# Patient Record
Sex: Female | Born: 1940 | Race: White | Hispanic: No | Marital: Single | Smoking: Never smoker
Health system: Southern US, Community
[De-identification: ages and names within clinical notes are randomized; demographics above are authoritative.]

## PROBLEM LIST (undated history)

## (undated) ENCOUNTER — Emergency Department (HOSPITAL_COMMUNITY): Payer: Medicare Other

## (undated) DIAGNOSIS — M810 Age-related osteoporosis without current pathological fracture: Secondary | ICD-10-CM

## (undated) DIAGNOSIS — R011 Cardiac murmur, unspecified: Secondary | ICD-10-CM

## (undated) DIAGNOSIS — H269 Unspecified cataract: Secondary | ICD-10-CM

## (undated) DIAGNOSIS — D759 Disease of blood and blood-forming organs, unspecified: Secondary | ICD-10-CM

## (undated) DIAGNOSIS — R509 Fever, unspecified: Secondary | ICD-10-CM

## (undated) HISTORY — DX: Unspecified cataract: H26.9

## (undated) HISTORY — PX: EYE SURGERY: SHX253

---

## 1998-04-16 ENCOUNTER — Ambulatory Visit (HOSPITAL_COMMUNITY): Admission: RE | Admit: 1998-04-16 | Discharge: 1998-04-16 | Payer: Self-pay | Admitting: Obstetrics & Gynecology

## 2000-06-17 ENCOUNTER — Other Ambulatory Visit: Admission: RE | Admit: 2000-06-17 | Discharge: 2000-06-17 | Payer: Self-pay | Admitting: Internal Medicine

## 2004-05-07 ENCOUNTER — Ambulatory Visit: Payer: Self-pay | Admitting: Internal Medicine

## 2004-05-14 ENCOUNTER — Ambulatory Visit: Payer: Self-pay | Admitting: Internal Medicine

## 2006-07-20 ENCOUNTER — Ambulatory Visit (HOSPITAL_COMMUNITY): Admission: RE | Admit: 2006-07-20 | Discharge: 2006-07-20 | Payer: Self-pay | Admitting: Internal Medicine

## 2007-08-16 ENCOUNTER — Ambulatory Visit (HOSPITAL_COMMUNITY): Admission: RE | Admit: 2007-08-16 | Discharge: 2007-08-16 | Payer: Self-pay | Admitting: Internal Medicine

## 2008-08-30 ENCOUNTER — Ambulatory Visit (HOSPITAL_COMMUNITY): Admission: RE | Admit: 2008-08-30 | Discharge: 2008-08-30 | Payer: Self-pay | Admitting: Internal Medicine

## 2009-09-05 ENCOUNTER — Ambulatory Visit (HOSPITAL_COMMUNITY): Admission: RE | Admit: 2009-09-05 | Discharge: 2009-09-05 | Payer: Self-pay | Admitting: Internal Medicine

## 2009-12-11 ENCOUNTER — Telehealth: Payer: Self-pay | Admitting: Internal Medicine

## 2010-01-05 ENCOUNTER — Ambulatory Visit: Payer: Self-pay | Admitting: Internal Medicine

## 2010-01-05 DIAGNOSIS — R159 Full incontinence of feces: Secondary | ICD-10-CM | POA: Insufficient documentation

## 2010-01-05 DIAGNOSIS — K648 Other hemorrhoids: Secondary | ICD-10-CM | POA: Insufficient documentation

## 2010-04-09 NOTE — Assessment & Plan Note (Signed)
Summary: Fecal incontinence   History of Present Illness Visit Type: consult Primary GI MD: Yancey Flemings MD Primary Provider: Geoffry Paradise, MD Chief Complaint: Staining in underpants History of Present Illness:   70 year old female with a history of hyperlipidemia and osteoarthritis. She presents today regarding fecal incontinence. Patient reports a several year history of intermittent problems with staining of her underwear with small-volume fecal material. This can occur several times per month. She notices this with certain food products such as milkshakes or nuts. Also with heavy lifting. She thinks that she has noticed protruding hemorrhoids. No abdominal pain, change in bowel habits, or bleeding. Her weight has been stable. No prior GYN surgery. No history of childbirth. She did undergo complete colonoscopy in March of 2006. This was normal except for sigmoid diverticulosis. Review of outside records from Dr. Lanell Matar office reveals recent CBC and comprehensive metabolic panel to be normal. As well, normal urinalysis and Hemoccult negative stool.   GI Review of Systems      Denies abdominal pain, acid reflux, belching, bloating, chest pain, dysphagia with liquids, dysphagia with solids, heartburn, loss of appetite, nausea, vomiting, vomiting blood, weight loss, and  weight gain.      Reports fecal incontinence.     Denies anal fissure, black tarry stools, change in bowel habit, constipation, diarrhea, diverticulosis, heme positive stool, hemorrhoids, irritable bowel syndrome, jaundice, light color stool, liver problems, rectal bleeding, and  rectal pain.    Current Medications (verified): 1)  Calcium 1200 1200-1000 Mg-Unit Chew (Calcium Carbonate-Vit D-Min) .Marland Kitchen.. 1 By Mouth Two Times A Day 2)  Fish Oil 1000 Mg Caps (Omega-3 Fatty Acids) .Marland Kitchen.. 1 By Mouth Two Times A Day 3)  Ocuvite Adult Formula  Caps (Multiple Vitamins-Minerals) .Marland Kitchen.. 1 By Mouth Once Daily 4)  Reclast 5 Mg/155ml Soln  (Zoledronic Acid) .... Intravenously Yearly 5)  Pravastatin Sodium 40 Mg Tabs (Pravastatin Sodium) .Marland Kitchen.. 1 By Mouth Once Daily  Allergies (verified): No Known Drug Allergies  Past History:  Past Medical History: Reviewed history from 01/02/2010 and no changes required. Diverticulosis Osteopenia Hyperlipidemia Osteoarthritis  Past Surgical History: Reviewed history from 01/02/2010 and no changes required. Laser retinopexy  Family History: None  Social History: Alcohol Use - yes Patient has never smoked.  Single, no children Lives with housemate Retired Illicit Drug Use - no  Review of Systems  The patient denies allergy/sinus, anemia, anxiety-new, arthritis/joint pain, back pain, blood in urine, breast changes/lumps, confusion, cough, coughing up blood, depression-new, fainting, fatigue, fever, headaches-new, hearing problems, heart murmur, heart rhythm changes, itching, menstrual pain, muscle pains/cramps, night sweats, nosebleeds, pregnancy symptoms, shortness of breath, skin rash, sleeping problems, sore throat, swelling of feet/legs, swollen lymph glands, thirst - excessive, urination - excessive, urination changes/pain, urine leakage, vision changes, and voice change.    Vital Signs:  Patient profile:   70 year old female Height:      66 inches Weight:      143 pounds BMI:     23.16 Pulse rate:   60 / minute Pulse rhythm:   regular BP sitting:   142 / 90  (left arm) Cuff size:   regular  Vitals Entered By: Francee Piccolo CMA Duncan Dull) (January 05, 2010 9:19 AM)  Physical Exam  General:  Well developed, well nourished, no acute distress. Head:  Normocephalic and atraumatic. Eyes:  PERRLA, no icterus. Mouth:  No deformity or lesions. Neck:  Supple; no masses or thyromegaly. Lungs:  Clear throughout to auscultation. Heart:  Regular rate and rhythm; no  murmurs, rubs,  or bruits. Abdomen:  Soft, nontender and nondistended. No masses, hepatosplenomegaly or  hernias noted. Normal bowel sounds. Rectal:  no external abnormalities. Rectal tone was adequate. No internal mass or tenderness, palpable internal hemorrhoids. Hemoccult negative stool Msk:  Symmetrical with no gross deformities. Normal posture. Pulses:  Normal pulses noted. Extremities:  no edema Neurologic:  alert and oriented Skin:  Intact without significant lesions or rashes. Psych:  Alert and cooperative. Normal mood and affect.   Impression & Recommendations:  Problem # 1:  FECAL INCONTINENCE (ICD-787.6) mild fecal incontinence intermittently. Likely due to hemorrhoids.  Plans: #1 daily fiber supplementation with Metamucil one to 2 tablespoons #2. Anusol suppositories prescribed. One nightly for 2 weeks, then p.r.n. #3. Followup p.r.n.  Problem # 2:  HEMORRHOIDS, INTERNAL (ICD-455.0) see above  Problem # 3:  SCREENING COLORECTAL-CANCER (ICD-V76.51) screening colonoscopy up to date. Based on current guidelines, due for routine followup around March 2016.  Patient Instructions: 1)  Copy sent to : Geoffry Paradise, MD 2)  Anusol HC suppositories #30 x 1 RF 1 per rectum at bedtime as needed 3)  Metamucil 1 Tablespoon once daily 4)  Please schedule a follow-up appointment as needed.  5)  The medication list was reviewed and reconciled.  All changed / newly prescribed medications were explained.  A complete medication list was provided to the patient / caregiver. Prescriptions: ANUSOL-HC 25 MG SUPP (HYDROCORTISONE ACETATE) 1 per rectum at bedtime as needed  #30 x 1   Entered by:   Milford Cage NCMA   Authorized by:   Hilarie Fredrickson MD   Signed by:   Milford Cage NCMA on 01/05/2010   Method used:   Electronically to        [facility] [phone]* (retail)       [address]       [place], [place]  [id number]       Ph: [phone] or [phone]       Fax: [phone]   RxID:   [id number]

## 2010-04-09 NOTE — Procedures (Signed)
Summary: Colon   Colonoscopy  Procedure date:  05/14/2004  Findings:      Location:  [facility].   Patient Name: Katherine Atkins, Katherine Atkins MRN:  Procedure Procedures: Colonoscopy CPT: 04540.  Personnel: Endoscopist: Wilhemina Bonito. Marina Goodell, MD.  Referred By: Pearletha Furl Jacky Kindle, MD.  Exam Location: Exam performed in Outpatient Clinic. Outpatient  Patient Consent: Procedure, Alternatives, Risks and Benefits discussed, consent obtained, from patient. Consent was obtained by the RN.  Indications  Average Risk Screening Routine.  History  Current Medications: Patient is not currently taking Coumadin.  Pre-Exam Physical: Performed May 14, 2004. Entire physical exam was normal.  Exam Exam: Extent of exam reached: Cecum, extent intended: Cecum.  The cecum was identified by appendiceal orifice and IC valve. Patient position: on left side. Colon retroflexion performed. Images taken. ASA Classification: I. Tolerance: excellent.  Monitoring: Pulse and BP monitoring, Oximetry used. Supplemental O2 given.  Colon Prep Used MIRALAX for colon prep. Prep results: good.  Sedation Meds: Patient assessed and found to be appropriate for moderate (conscious) sedation. Fentanyl 50 mcg. given IV. Versed 7 mg. given IV.  Findings NORMAL EXAM: Cecum to Rectum.  - DIVERTICULOSIS: Sigmoid Colon. ICD9: Diverticulosis, Colon: 562.10.    Comments: NO POLYPS SEEN Assessment Abnormal examination, see findings above.  Diagnoses: 562.10: Diverticulosis, Colon.   Events  Unplanned Interventions: No intervention was required.  Unplanned Events: There were no complications. Plans Disposition: After procedure patient sent to recovery. After recovery patient sent home.  Scheduling/Referral: Colonoscopy, to Wilhemina Bonito. Marina Goodell, MD, IN ABOUT 7 YEARS IF MEDICALLY FIT,   Comments: RETURN TO THE CARE OF DR. Jacky Kindle This report was created from the original endoscopy report, which was reviewed and signed  by the above listed endoscopist.

## 2010-04-09 NOTE — Progress Notes (Signed)
Summary: Soiling Accidents  Phone Note From Other Clinic   Caller: [facility] @ Dr Jacky Kindle [phone] Call For: Dr Marina Goodell Reason for Call: Schedule Patient Appt Summary of Call: Having soiling accidents and would like to be seen asap. First available 01-23-10 Initial call taken by: Leanor Kail [facility],  December 11, 2009 10:34 AM  Follow-up for Phone Call        left message on machine to call back Chales Abrahams CMA Duncan Dull)  December 11, 2009 10:42 AM   pt having fecal incontinence and appt scheduled with  Dr Marina Goodell on 01/05/10. Follow-up by: Chales Abrahams CMA Duncan Dull),  December 11, 2009 1:53 PM

## 2010-09-08 ENCOUNTER — Encounter (HOSPITAL_COMMUNITY): Payer: Medicare Other | Attending: Internal Medicine

## 2010-09-08 DIAGNOSIS — M810 Age-related osteoporosis without current pathological fracture: Secondary | ICD-10-CM | POA: Insufficient documentation

## 2011-03-09 HISTORY — PX: CATARACT EXTRACTION: SUR2

## 2011-03-31 ENCOUNTER — Encounter: Payer: Self-pay | Admitting: Internal Medicine

## 2011-08-03 ENCOUNTER — Ambulatory Visit (INDEPENDENT_AMBULATORY_CARE_PROVIDER_SITE_OTHER): Payer: Medicare Other | Admitting: Family Medicine

## 2011-08-03 VITALS — BP 151/94 | HR 63 | Temp 98.5°F | Resp 20 | Ht 66.5 in | Wt 138.0 lb

## 2011-08-03 DIAGNOSIS — W458XXA Other foreign body or object entering through skin, initial encounter: Secondary | ICD-10-CM

## 2011-08-03 DIAGNOSIS — L02519 Cutaneous abscess of unspecified hand: Secondary | ICD-10-CM

## 2011-08-03 DIAGNOSIS — L03019 Cellulitis of unspecified finger: Secondary | ICD-10-CM

## 2011-08-03 DIAGNOSIS — T148XXA Other injury of unspecified body region, initial encounter: Secondary | ICD-10-CM

## 2011-08-03 MED ORDER — CEPHALEXIN 500 MG PO CAPS: 500.0000 mg | ORAL_CAPSULE | Freq: Three times a day (TID) | ORAL | Status: AC

## 2011-08-03 NOTE — Progress Notes (Signed)
  Patient Name: Katherine Atkins Date of Birth: [birth year] Medical Record Number: [id number] Gender: female Date of Encounter: 08/03/2011  History of Present Illness:  Katherine Atkins is a 71 y.o. very pleasant female patient who presents with the following:  About 9 days ago a piece of wood from a door went into the pad of her right ring finger.  She got some of the splinter out, but thinks there is still sore splinter in the finger.  The finger is also starting to be more tender- then yesterday she squeezed it and some pus came out.    Last tetanus shot about 2 years ago.   She is otherwise quite healthy and feeling well  Patient Active Problem List  Diagnoses  . HEMORRHOIDS, INTERNAL  . FECAL INCONTINENCE   No past medical history on file. No past surgical history on file. History  Substance Use Topics  . Smoking status: Never Smoker   . Smokeless tobacco: Not on file  . Alcohol Use: Not on file   No family history on file. No Known Allergies  Medication list has been reviewed and updated.  Review of Systems: As per HPI- otherwise negative.  Physical Examination: Filed Vitals:   08/03/11 1047  BP: 151/94  Pulse: 63  Temp: 98.5 F (36.9 C)  TempSrc: Oral  Resp: 20  Height: 5' 6.5" (1.689 m)  Weight: 138 lb (62.596 kg)    Body mass index is 21.94 kg/(m^2).    GEN: WDWN, NAD, Non-toxic, Alert & Oriented x 3 HEENT: Atraumatic, Normocephalic.  Ears and Nose: No external deformity. EXTR: No clubbing/cyanosis/edema NEURO: Normal gait.  PSYCH: Normally interactive. Conversant. Not depressed or anxious appearing.  Calm demeanor.   Right ring finger- there is a wound on the pad on the ring finger.  Cleaned the area and gently explored with a needle- no obvious splinter seen.  The area is a little bit swollen and tender  Assessment and Plan: 1. Cellulitis, finger  cephALEXin (KEFLEX) 500 MG capsule  2. Splinter     Splinter in finger- likely has a bit of cellulitis.  Do  not think it will be helpful to "dig" in finger to try and locate splinter, but will treat with keflex.  She will soak finger to try and bring any splinter that is still present to the surface.  Let me know if not better in the next few days- Sooner if worse.

## 2011-09-15 ENCOUNTER — Encounter (HOSPITAL_COMMUNITY): Payer: Self-pay

## 2011-09-15 ENCOUNTER — Encounter (HOSPITAL_COMMUNITY)
Admission: RE | Admit: 2011-09-15 | Discharge: 2011-09-15 | Disposition: A | Discharge status: Home / Self Care | Payer: Medicare Other | Source: Ambulatory Visit | Attending: Internal Medicine | Admitting: Internal Medicine

## 2011-09-15 DIAGNOSIS — M810 Age-related osteoporosis without current pathological fracture: Secondary | ICD-10-CM | POA: Insufficient documentation

## 2011-09-15 HISTORY — DX: Cardiac murmur, unspecified: R01.1

## 2011-09-15 HISTORY — DX: Age-related osteoporosis without current pathological fracture: M81.0

## 2011-09-15 MED ORDER — SODIUM CHLORIDE 0.9 % IV SOLN
Freq: Once | INTRAVENOUS | Status: AC
  Administered 2011-09-15: 16:00:00 via INTRAVENOUS

## 2011-09-15 MED ORDER — ZOLEDRONIC ACID 5 MG/100ML IV SOLN
5.0000 mg | Freq: Once | INTRAVENOUS | Status: AC
  Administered 2011-09-15: 5 mg via INTRAVENOUS
  Filled 2011-09-15: qty 100

## 2012-01-06 ENCOUNTER — Encounter: Payer: Self-pay | Admitting: Internal Medicine

## 2012-06-23 ENCOUNTER — Ambulatory Visit (INDEPENDENT_AMBULATORY_CARE_PROVIDER_SITE_OTHER): Payer: Medicare Other | Admitting: Family Medicine

## 2012-06-23 ENCOUNTER — Ambulatory Visit: Payer: Medicare Other

## 2012-06-23 VITALS — BP 178/96 | HR 66 | Temp 98.4°F | Resp 16 | Ht 65.5 in | Wt 146.2 lb

## 2012-06-23 DIAGNOSIS — M25531 Pain in right wrist: Secondary | ICD-10-CM

## 2012-06-23 DIAGNOSIS — S66911A Strain of unspecified muscle, fascia and tendon at wrist and hand level, right hand, initial encounter: Secondary | ICD-10-CM

## 2012-06-23 DIAGNOSIS — S63509A Unspecified sprain of unspecified wrist, initial encounter: Secondary | ICD-10-CM

## 2012-06-23 DIAGNOSIS — R0781 Pleurodynia: Secondary | ICD-10-CM

## 2012-06-23 DIAGNOSIS — M25539 Pain in unspecified wrist: Secondary | ICD-10-CM

## 2012-06-23 DIAGNOSIS — S20219A Contusion of unspecified front wall of thorax, initial encounter: Secondary | ICD-10-CM

## 2012-06-23 DIAGNOSIS — S20211A Contusion of right front wall of thorax, initial encounter: Secondary | ICD-10-CM

## 2012-06-23 DIAGNOSIS — R079 Chest pain, unspecified: Secondary | ICD-10-CM

## 2012-06-23 NOTE — Progress Notes (Signed)
[address]   [place], [place]  [address]   [phone]  Subjective:    Patient ID: Katherine Atkins, female    DOB: 1940/05/14, 72 y.o.   MRN: [id number]  HPI This 72 y.o. female presents for evaluation of R wrist pain and R rib pain. Tripped over branch in yard and landed on ground forward.  Knocked wind out of pt.  Strapped rib last night with relief.  Has been wearing wrist splint.    1. R wrist pain: unable to supinate well; range of motion limited.  Wearing wrist splint.  Able to massage area proximally.  No n/t in forearm or hand.  No nighttime awakening; took two Advil last night.  No previous injury.  +swelling.  Iced followed by heat all night.  2.  R ribs anterior pain: fell on ribs; painful.  No SOB; no coughing.   No pain with deep inspiration.  Minimal pain with range of motion.  PCP:  Frankey Poot.   Review of Systems  Constitutional: Negative for fever, chills, diaphoresis and fatigue.  Respiratory: Negative for cough, chest tightness, shortness of breath, wheezing and stridor.   Cardiovascular: Negative for chest pain.  Musculoskeletal: Positive for myalgias, joint swelling and arthralgias. Negative for back pain and gait problem.  Skin: Negative for color change and wound.  Neurological: Positive for weakness. Negative for numbness.        Past Medical History  Diagnosis Date  . Osteoporosis   . Heart murmur   . Cataract     Past Surgical History  Procedure Laterality Date  . Eye surgery      Prior to Admission medications   Medication Sig Start Date End Date Taking? Authorizing Provider  beta carotene w/minerals (OCUVITE) tablet Take 1 tablet by mouth daily.   Yes Historical Provider, MD  calcium & magnesium carbonates (MYLANTA) 311-232 MG per tablet Take 11,500 tablets by mouth daily.   Yes Historical Provider, MD  fish oil-omega-3 fatty acids 1000 MG capsule Take 600 mg by mouth daily.   Yes Historical Provider, MD  Red Yeast Rice Extract (RED YEAST  RICE PO) Take 1 capsule by mouth daily.   Yes Historical Provider, MD  Zoledronic Acid (RECLAST IV) Inject into the vein.   Yes Historical Provider, MD    No Known Allergies  History   Social History  . Marital Status: Single    Spouse Name: N/A    Number of Children: N/A  . Years of Education: N/A   Occupational History  . Not on file.   Social History Main Topics  . Smoking status: Never Smoker   . Smokeless tobacco: Not on file  . Alcohol Use: No  . Drug Use: No  . Sexually Active: Yes   Other Topics Concern  . Not on file   Social History Narrative  . No narrative on file    Family History  Problem Relation Age of Onset  . Kidney disease Father   . Heart disease Maternal Grandfather   . Heart disease Paternal Grandmother     Objective:   Physical Exam  Nursing note and vitals reviewed. Constitutional: She appears well-developed and well-nourished. No distress.  HENT:  Head: Normocephalic and atraumatic.  Cardiovascular: Normal rate, regular rhythm, normal heart sounds and intact distal pulses.   Pulmonary/Chest: Effort normal and breath sounds normal. She has no wheezes. She has no rales.  Musculoskeletal:       Right elbow: Normal.  Right wrist: She exhibits decreased range of motion, tenderness, bony tenderness and swelling. She exhibits no effusion, no crepitus, no deformity and no laceration.       Right hand: Normal.  R WRIST/HAND:  +TTP ULNAR ASPECT OF WRIST; DECREASED ROM OF PRONATION/SUPINATION; GRIP 5/5; NORMAL FLEXION; MILD PAIN WITH EXTENSION; HAND NON-TENDER WITHOUT SWELLING.   R ELBOW: NEAR FULL EXTENSION; NON-TENDER. R RIBS:  +TTP ANTERIOR RIBS UNDER BREAST.    Skin: Skin is warm and dry. No rash noted. She is not diaphoretic. No erythema. No pallor.  Psychiatric: She has a normal mood and affect. Her behavior is normal. Judgment and thought content normal.       [facility] reading (PRIMARY) by  Dr. Katrinka Blazing.  R WRIST: NAD   R RIBS:   NAD.   Assessment & Plan:  Right wrist pain - Plan: DG Wrist Complete Right  Rib pain on right side - Plan: DG Ribs Unilateral W/Chest Right  Strain of wrist, right, initial encounter  Rib contusion, right, initial encounter   1.  R wrist pain/strain:  New.  Wrist splint fitted in office. Recommend rest, ice, elevate, wrist splint.  Pt declined rx for NSAIDs. 2. R rib pain/contusion:  New.  Recommend rest, ice.  Recommend taking deep breaths frequently throughout the day.

## 2012-09-22 ENCOUNTER — Ambulatory Visit (HOSPITAL_COMMUNITY)
Admission: RE | Admit: 2012-09-22 | Discharge: 2012-09-22 | Disposition: A | Discharge status: Home / Self Care | Payer: Medicare Other | Source: Ambulatory Visit | Attending: Internal Medicine | Admitting: Internal Medicine

## 2012-09-22 ENCOUNTER — Other Ambulatory Visit (HOSPITAL_COMMUNITY): Payer: Self-pay | Admitting: Internal Medicine

## 2012-09-22 ENCOUNTER — Encounter (HOSPITAL_COMMUNITY): Payer: Self-pay

## 2012-09-22 DIAGNOSIS — M810 Age-related osteoporosis without current pathological fracture: Secondary | ICD-10-CM | POA: Insufficient documentation

## 2012-09-22 MED ORDER — ZOLEDRONIC ACID 5 MG/100ML IV SOLN
5.0000 mg | Freq: Once | INTRAVENOUS | Status: AC
  Administered 2012-09-22: 5 mg via INTRAVENOUS
  Filled 2012-09-22: qty 100

## 2012-09-22 MED ORDER — SODIUM CHLORIDE 0.9 % IV SOLN
Freq: Once | INTRAVENOUS | Status: AC
Start: 2012-09-22 — End: 2012-09-22
  Administered 2012-09-22: 09:00:00 via INTRAVENOUS

## 2013-08-25 ENCOUNTER — Ambulatory Visit (INDEPENDENT_AMBULATORY_CARE_PROVIDER_SITE_OTHER): Payer: Medicare Other

## 2013-08-25 ENCOUNTER — Encounter (HOSPITAL_COMMUNITY): Payer: Self-pay | Admitting: *Deleted

## 2013-08-25 ENCOUNTER — Inpatient Hospital Stay (HOSPITAL_COMMUNITY)
Admission: AD | Admit: 2013-08-25 | Discharge: 2013-08-28 | DRG: 864 | Disposition: A | Discharge status: Home / Self Care | Payer: Medicare Other | Source: Ambulatory Visit | Attending: Internal Medicine | Admitting: Internal Medicine

## 2013-08-25 ENCOUNTER — Ambulatory Visit (INDEPENDENT_AMBULATORY_CARE_PROVIDER_SITE_OTHER): Payer: Medicare Other | Admitting: Emergency Medicine

## 2013-08-25 VITALS — BP 116/70 | HR 74 | Temp 97.8°F | Resp 14 | Ht 66.0 in | Wt 145.6 lb

## 2013-08-25 DIAGNOSIS — R509 Fever, unspecified: Secondary | ICD-10-CM

## 2013-08-25 DIAGNOSIS — R7881 Bacteremia: Secondary | ICD-10-CM | POA: Diagnosis present

## 2013-08-25 DIAGNOSIS — D696 Thrombocytopenia, unspecified: Secondary | ICD-10-CM

## 2013-08-25 DIAGNOSIS — M810 Age-related osteoporosis without current pathological fracture: Secondary | ICD-10-CM | POA: Diagnosis present

## 2013-08-25 DIAGNOSIS — E871 Hypo-osmolality and hyponatremia: Secondary | ICD-10-CM | POA: Diagnosis present

## 2013-08-25 DIAGNOSIS — M1990 Unspecified osteoarthritis, unspecified site: Secondary | ICD-10-CM | POA: Diagnosis present

## 2013-08-25 DIAGNOSIS — D72819 Decreased white blood cell count, unspecified: Secondary | ICD-10-CM | POA: Insufficient documentation

## 2013-08-25 DIAGNOSIS — E785 Hyperlipidemia, unspecified: Secondary | ICD-10-CM | POA: Diagnosis present

## 2013-08-25 DIAGNOSIS — D61818 Other pancytopenia: Secondary | ICD-10-CM | POA: Diagnosis present

## 2013-08-25 DIAGNOSIS — R7989 Other specified abnormal findings of blood chemistry: Secondary | ICD-10-CM | POA: Diagnosis present

## 2013-08-25 DIAGNOSIS — IMO0001 Reserved for inherently not codable concepts without codable children: Secondary | ICD-10-CM | POA: Diagnosis present

## 2013-08-25 DIAGNOSIS — Z9849 Cataract extraction status, unspecified eye: Secondary | ICD-10-CM

## 2013-08-25 DIAGNOSIS — K5730 Diverticulosis of large intestine without perforation or abscess without bleeding: Secondary | ICD-10-CM | POA: Diagnosis present

## 2013-08-25 DIAGNOSIS — R21 Rash and other nonspecific skin eruption: Secondary | ICD-10-CM

## 2013-08-25 DIAGNOSIS — W57XXXA Bitten or stung by nonvenomous insect and other nonvenomous arthropods, initial encounter: Secondary | ICD-10-CM | POA: Diagnosis present

## 2013-08-25 DIAGNOSIS — T148 Other injury of unspecified body region: Secondary | ICD-10-CM | POA: Diagnosis present

## 2013-08-25 DIAGNOSIS — R945 Abnormal results of liver function studies: Secondary | ICD-10-CM

## 2013-08-25 DIAGNOSIS — B9689 Other specified bacterial agents as the cause of diseases classified elsewhere: Secondary | ICD-10-CM | POA: Diagnosis present

## 2013-08-25 HISTORY — DX: Fever, unspecified: R50.9

## 2013-08-25 HISTORY — DX: Disease of blood and blood-forming organs, unspecified: D75.9

## 2013-08-25 LAB — POCT URINALYSIS DIPSTICK
Glucose, UA: NEGATIVE
KETONES UA: 15
LEUKOCYTES UA: NEGATIVE
Nitrite, UA: NEGATIVE
PH UA: 5.5
PROTEIN UA: 100
SPEC GRAV UA: 1.025
Urobilinogen, UA: 1

## 2013-08-25 LAB — URINALYSIS, ROUTINE W REFLEX MICROSCOPIC
BILIRUBIN URINE: NEGATIVE
Glucose, UA: NEGATIVE mg/dL
Hgb urine dipstick: NEGATIVE
Ketones, ur: 15 mg/dL — AB
Leukocytes, UA: NEGATIVE
Nitrite: NEGATIVE
PH: 6 (ref 5.0–8.0)
Protein, ur: NEGATIVE mg/dL
Specific Gravity, Urine: 1.014 (ref 1.005–1.030)
Urobilinogen, UA: 1 mg/dL (ref 0.0–1.0)

## 2013-08-25 LAB — POCT UA - MICROSCOPIC ONLY
Amorphous: POSITIVE
BACTERIA, U MICROSCOPIC: NEGATIVE
Crystals, Ur, HPF, POC: NEGATIVE
EPITHELIAL CELLS, URINE PER MICROSCOPY: NEGATIVE
MUCUS UA: NEGATIVE
YEAST UA: NEGATIVE

## 2013-08-25 LAB — POCT CBC
Granulocyte percent: 62.6 %G (ref 37–80)
HEMATOCRIT: 44.8 % (ref 37.7–47.9)
HEMOGLOBIN: 14.4 g/dL (ref 12.2–16.2)
LYMPH, POC: 0.8 (ref 0.6–3.4)
MCH: 30.5 pg (ref 27–31.2)
MCHC: 32.1 g/dL (ref 31.8–35.4)
MCV: 95 fL (ref 80–97)
MID (cbc): 0.2 (ref 0–0.9)
MPV: 8.9 fL (ref 0–99.8)
POC Granulocyte: 1.7 — AB (ref 2–6.9)
POC LYMPH %: 31 % (ref 10–50)
POC MID %: 6.4 %M (ref 0–12)
Platelet Count, POC: 79 10*3/uL — AB (ref 142–424)
RBC: 4.72 M/uL (ref 4.04–5.48)
RDW, POC: 13.1 %
WBC: 2.7 10*3/uL — AB (ref 4.6–10.2)

## 2013-08-25 LAB — COMPREHENSIVE METABOLIC PANEL
ALBUMIN: 3.9 g/dL (ref 3.5–5.2)
ALBUMIN: 2.8 g/dL — AB (ref 3.5–5.2)
ALT: 152 U/L — ABNORMAL HIGH (ref 0–35)
ALT: 131 U/L — AB (ref 0–35)
AST: 176 U/L — AB (ref 0–37)
AST: 149 U/L — AB (ref 0–37)
Alkaline Phosphatase: 218 U/L — ABNORMAL HIGH (ref 39–117)
Alkaline Phosphatase: 191 U/L — ABNORMAL HIGH (ref 39–117)
BILIRUBIN TOTAL: 1.5 mg/dL — AB (ref 0.3–1.2)
BUN: 16 mg/dL (ref 6–23)
BUN: 14 mg/dL (ref 6–23)
CALCIUM: 8.7 mg/dL (ref 8.4–10.5)
CHLORIDE: 95 meq/L — AB (ref 96–112)
CHLORIDE: 94 meq/L — AB (ref 96–112)
CO2: 25 mEq/L (ref 19–32)
CO2: 20 mEq/L (ref 19–32)
CREATININE: 1.1 mg/dL (ref 0.50–1.10)
Calcium: 8.2 mg/dL — ABNORMAL LOW (ref 8.4–10.5)
Creatinine, Ser: 0.84 mg/dL (ref 0.50–1.10)
GFR calc Af Amer: 77 mL/min — ABNORMAL LOW (ref >90)
GFR calc non Af Amer: 67 mL/min — ABNORMAL LOW (ref >90)
GLUCOSE: 105 mg/dL — AB (ref 70–99)
Glucose, Bld: 157 mg/dL — ABNORMAL HIGH (ref 70–99)
POTASSIUM: 4.1 meq/L (ref 3.5–5.3)
POTASSIUM: 3.6 meq/L — AB (ref 3.7–5.3)
SODIUM: 130 meq/L — AB (ref 137–147)
Sodium: 130 mEq/L — ABNORMAL LOW (ref 135–145)
Total Bilirubin: 1.8 mg/dL — ABNORMAL HIGH (ref 0.2–1.2)
Total Protein: 6.9 g/dL (ref 6.0–8.3)
Total Protein: 6 g/dL (ref 6.0–8.3)

## 2013-08-25 LAB — CBC
HCT: 36.4 % (ref 36.0–46.0)
Hemoglobin: 12.4 g/dL (ref 12.0–15.0)
MCH: 30.8 pg (ref 26.0–34.0)
MCHC: 34.1 g/dL (ref 30.0–36.0)
MCV: 90.3 fL (ref 78.0–100.0)
PLATELETS: 60 10*3/uL — AB (ref 150–400)
RBC: 4.03 MIL/uL (ref 3.87–5.11)
RDW: 13 % (ref 11.5–15.5)
WBC: 2.1 10*3/uL — ABNORMAL LOW (ref 4.0–10.5)

## 2013-08-25 LAB — SEDIMENTATION RATE: Sed Rate: 5 mm/hr (ref 0–22)

## 2013-08-25 MED ORDER — ACETAMINOPHEN 325 MG PO TABS
650.0000 mg | ORAL_TABLET | Freq: Four times a day (QID) | ORAL | Status: DC | PRN
  Administered 2013-08-25 – 2013-08-28 (×3): 650 mg via ORAL
  Filled 2013-08-25 (×3): qty 2

## 2013-08-25 MED ORDER — CALCIUM & MAGNESIUM CARBONATES 311-232 MG PO TABS: 1.0000 | ORAL_TABLET | Freq: Two times a day (BID) | ORAL | Status: DC | PRN

## 2013-08-25 MED ORDER — OMEGA-3 FATTY ACIDS 1000 MG PO CAPS: 1.0000 g | ORAL_CAPSULE | Freq: Every day | ORAL | Status: DC

## 2013-08-25 MED ORDER — OCUVITE PO TABS
1.0000 | ORAL_TABLET | Freq: Every day | ORAL | Status: DC
  Administered 2013-08-26 – 2013-08-28 (×3): 1 via ORAL
  Filled 2013-08-25 (×3): qty 1

## 2013-08-25 MED ORDER — SODIUM CHLORIDE 0.9 % IV SOLN
INTRAVENOUS | Status: DC
Start: 2013-08-25 — End: 2013-08-28
  Administered 2013-08-25 – 2013-08-26 (×2): via INTRAVENOUS
  Administered 2013-08-28: 75 mL/h via INTRAVENOUS

## 2013-08-25 MED ORDER — OMEGA-3-ACID ETHYL ESTERS 1 G PO CAPS
1.0000 g | ORAL_CAPSULE | Freq: Every day | ORAL | Status: DC
  Administered 2013-08-26 – 2013-08-28 (×3): 1 g via ORAL
  Filled 2013-08-25 (×3): qty 1

## 2013-08-25 MED ORDER — ACETAMINOPHEN 650 MG RE SUPP: 650.0000 mg | Freq: Four times a day (QID) | RECTAL | Status: DC | PRN

## 2013-08-25 MED ORDER — CALCIUM CARBONATE ANTACID 500 MG PO CHEW: 1.0000 | CHEWABLE_TABLET | Freq: Two times a day (BID) | ORAL | Status: DC | PRN

## 2013-08-25 NOTE — H&P (Signed)
PCP:   No primary Damier Disano on file.   Chief Complaint:  General malaise, some myalgias associated with fevers up to 102.7 over the last 2-days  HPI: 73 year old female active on a written form with a past medical history significant for mild osteoarthritis, osteoporosis, allergic rhinitis and mild hyperlipidemia on no active prescription medications except for intravenous Reclast last administered last year. She does have some issues with chronic mild cough secondary to postnasal drip from allergies. Patient and noted a 2-3 day history of intermittent fevers up 102.93F with general malaise some arthralgias associated with high fever some cold intolerance as a result, but denies any visual changes headaches, difficulty swallowing, does have chronic dry cough, which is unchanged per patient report, denies any shortness of breath, wheezing, bronchospasm, chest pain, palpitations, orthopnea, presyncope or syncope, denies nausea, vomiting,  constipation, blood in stool however did have 2 episodes of diarrhea without blood last Thursday, denies any lower extremity edema, dysuria, frequency, gross hematuria. Denies any skin changes. However when evaluated in urgent care earlier today, skin rash was noted and she did observe that this was new for her. Denies any new medications and indeed is quite active around her property, has been painting a deck, fixing fence, and has been exposed to multiple takes, last exposed off approximately 2 weeks ago, denies any synovitis, has had a decrease in appetite over the last 24-48 hours. But denies any gastric irritation.  Review of Systems:  See history of present illness-The patient denies  weight loss,, vision loss, decreased hearing, hoarseness, chest pain, syncope, dyspnea on exertion, peripheral edema, balance deficits, hemoptysis, abdominal pain, melena, hematochezia, severe indigestion/heartburn, hematuria, incontinence, genital sores, muscle weakness,  transient  blindness, difficulty walking, depression, unusual weight change, abnormal bleeding, enlarged lymph nodes, angioedema, and breast masses.  Past Medical History: Past Medical History  Diagnosis Date  . Osteoporosis   . Heart murmur   . Cataract    Past Surgical History  Procedure Laterality Date  . Eye surgery    . Cataract extraction  2013  Past History  Past Medical History (reviewed - no changes required): Allergic rhinitis  Diverticulosis  Hyperlipidemia  Osteoarthritis  Osteopenia  Retinal break without detachment, OS (November 2009)  History left leg remote fracture skiing Surgical History (reviewed - no changes required): Laser retinopexy (January 31, 2008; Dr. Fawn KirkGary Rankin)  Family History significant for Alzheimer's dementia late age Social History (reviewed - no changes required): Patient is single. Has significant other She is retired. Patient denies the use of tobacco, illicit drugs, and excessive alcohol.    Physical Exam  Pleasant, verbal, female in no apparent distress answering all questions appropriately no respiratory distress Extraocular movements are intact sclera anicteric face is symmetric, no oropharyngeal lesions, dentition intact Neck supple, no cervical lymphadenopathy, or rigidity, no stiffness, carotids palpable Lungs clear to auscultation bilaterally without bronchospasm or rhonchi Cardiovascular reveals regular rate and rhythm without murmurs rubs or gallops appreciated Abdomen soft, nontender, nondistended, bowel sounds present no hepatosplenomegaly appreciated  No edema, pedal pulses intact, no cyanosis, Oster 3 changes mild in the distal extremities, no active synovitis, no erythema Cranial nerves II through XII grossly intact, no resting tremors, tone normal, strength intact in all 4 extremities, no sensory deficits to light touch Alert and oriented x3 Skin exam does reveal a very fine maculopapular rash that blanches slightly erythematous on the  abdomen and possibly to some extent on the thighs and distal calves but very minor and faint had to be  pointed out to the patient agreed that this was new  Medications: Prior to Admission medications   Medication Sig Start Date End Date Taking? Authorizing Shondra Capps  beta carotene w/minerals (OCUVITE) tablet Take 1 tablet by mouth daily.    Historical Analysa Nutting, MD  calcium & magnesium carbonates (MYLANTA) 311-232 MG per tablet Take 11,500 tablets by mouth daily.    Historical Evania Lyne, MD  fish oil-omega-3 fatty acids 1000 MG capsule Take 600 mg by mouth daily.    Historical Ram Haugan, MD  Red Yeast Rice Extract (RED YEAST RICE PO) Take 1 capsule by mouth daily.    Historical Airyanna Dipalma, MD  Zoledronic Acid (RECLAST IV) Inject into the vein.    Historical Serge Main, MD    Allergies:  No Known Allergies  Physical Exam: Filed Vitals:   08/25/13 1526  BP: 122/79  Pulse: 73  Temp: 96.1 F (35.6 C)  TempSrc: Oral  Resp: 15  Height: 5\' 6"  (1.676 m)  Weight: 65.772 kg (145 lb)  SpO2: 97%      Labs on Admission:  No results found for this basename: NA, K, CL, CO2, GLUCOSE, BUN, CREATININE, CALCIUM, MG, PHOS,  in the last 72 hours No results found for this basename: AST, ALT, ALKPHOS, BILITOT, PROT, ALBUMIN,  in the last 72 hours No results found for this basename: LIPASE, AMYLASE,  in the last 72 hours  Recent Labs  08/25/13 1343  WBC 2.7*  HGB 14.4  HCT 44.8  MCV 95.0   No results found for this basename: CKTOTAL, CKMB, CKMBINDEX, TROPONINI,  in the last 72 hours No results found for this basename: TSH, T4TOTAL, FREET3, T3FREE, THYROIDAB,  in the last 72 hours No results found for this basename: VITAMINB12, FOLATE, FERRITIN, TIBC, IRON, RETICCTPCT,  in the last 72 hours  Radiological Exams on Admission: Dg Chest 2 View  08/25/2013   CLINICAL DATA:  Fever  EXAM: CHEST  2 VIEW  COMPARISON:  06/23/2012  FINDINGS: Heart size and vascular pattern are normal. Lungs are clear.  Uncoiling of the aorta is stable.  IMPRESSION: No active cardiopulmonary disease.   Electronically Signed   By: Esperanza Heiraymond  Rubner M.D.   On: 08/25/2013 15:36   No orders found for this or any previous visit.  Assessment/Plan Fever for 2-3 days with arthralgia, mild rash, given clinical setting of significant exposure to outdoor including tics, with thrombocytopenia, and benign physical exam otherwise including review of systems highly consistent with [facility] fever with other entities less likely, no evidence of CNS disease, or significant deficits, we'll defer lumbar puncture, will check renal parameters and electrolytes including liver function test. One could consider outpatient management however given age, and clear differences in CBC compared with annual physical exam approximately 3 months ago where her CBC was normal, and possible propensity for worsening symptoms and findings, will observe the next 24-48 hours, chest x-ray normal  Our plan will be to provide IV fluids, obtain blood cultures given high fevers, check renal and liver parameters including electrolytes and followup on urinalysis; Tylenol for symptom management, and empiric use of doxycycline. Given that symptoms started 2-3 days ago, utility of serologies would be quite low.will need to watch for hyponatremia  Thrombo cytopenia-presumably secondary to current infection in conjunction with leukopenia, will recheck CBC in the morning home and no bleeding complications or history of bleeding.  will defer pharmacologic DVT prophylaxis secondary to thrombocytopenia, and use SCDs.  AVVA,RAVISANKAR R 08/25/2013, 4:02 PM

## 2013-08-25 NOTE — Progress Notes (Signed)
   Subjective:    Patient ID: Katherine Atkins, female    DOB: 1940/12/13, 73 y.o.   MRN: [id number]  HPI    Review of Systems     Objective:   Physical Exam        Assessment & Plan:

## 2013-08-25 NOTE — Progress Notes (Addendum)
Subjective:    Patient ID: Katherine Atkins, female    DOB: 23-Nov-1940, 73 y.o.   MRN: [id number]   This chart was scribed for Lesle ChrisSteven Daub, MD by Chestine SporeSoijett Blue, ED Scribe. The patient was seen in room 14 at 1:03 PM.    Chief Complaint  Patient presents with  . Fever    x's 2 days  . Tick Removal    x's 2 weeks ago   . Diarrhea     HPI HPI Comments:  Katherine Atkins is a 73 y.o. female who presents today complaining of a fever with the max temperature of 102.7 onset 2 days ago. She states that on Wednesday she went to lunch with a friend and she didn't eat a lot. Her caretaker and friend states that on Thursday she was lying around and didn't eat. Her caretaker states that she took her temperature and it was 102.7. She states that on Friday she called Dr. Michail Jewelsichard Aaronson about her ailments. Dr. Lorain ChildesAaronson informed her to take Tylenol and treat the fever. Pt took the meds and took her temp on Thursday and it came back normal. Pt states that on Friday, she still didn't feel good, her temp went back up to 102. Pt states that she has been working in the woods and has a couple tick bites and had to pull some off her. 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 She states that she is normally in good health. She states that today her fever was 102.7 again, and that is what prompted her to come in today. She states that she has associated symptoms of a cough, HA, fever, and diarrhea.  She states that she did her deck with wood preservated and thought that she toxicated herself from the fumes. She states that she took a Bendaryl and then it alleviated her symptoms. She denies rash, sore throat, stomach pain, nausea, vomiting, photophobia, and difficulty urinating. She states that her last known tick bite was 2 weeks ago. She states that she has not traveled recently or taken antibiotics.    Past Medical History  Diagnosis Date  . Osteoporosis   . Heart murmur   . Cataract     Current  Outpatient Prescriptions on File Prior to Visit  Medication Sig Dispense Refill  . beta carotene w/minerals (OCUVITE) tablet Take 1 tablet by mouth daily.      . fish oil-omega-3 fatty acids 1000 MG capsule Take 600 mg by mouth daily.      . calcium & magnesium carbonates (MYLANTA) 311-232 MG per tablet Take 11,500 tablets by mouth daily.      . Red Yeast Rice Extract (RED YEAST RICE PO) Take 1 capsule by mouth daily.      . Zoledronic Acid (RECLAST IV) Inject into the vein.       No current facility-administered medications on file prior to visit.    No Known Allergies   Review of Systems  Constitutional: Positive for fever (Max temperature of 102.7).  HENT: Negative for sore throat.   Eyes: Negative for photophobia.  Respiratory: Positive for cough.   Gastrointestinal: Positive for diarrhea. Negative for nausea, vomiting and abdominal pain.  Genitourinary: Negative for difficulty urinating.  Skin: Negative for rash.  Neurological: Positive for headaches.     BP 116/70  Pulse 74  Temp(Src) 97.8 F (36.6 C) (Oral)  Resp 14  Ht 5\' 6"  (1.676 m)  Wt 145 lb 9.6 oz (66.044 kg)  BMI  23.51 kg/m2  SpO2 97%     Objective:   Physical Exam  Nursing note and vitals reviewed. Constitutional: She is oriented to person, place, and time. She appears well-developed and well-nourished. No distress.  HENT:  Head: Normocephalic and atraumatic.  Eyes: EOM are normal.  Neck: Neck supple. No tracheal deviation present.  Cardiovascular: Normal rate and regular rhythm.   Pulmonary/Chest: Effort normal and breath sounds normal. No respiratory distress.  Occasional Rhonchi bilaterally.  Abdominal: There is no tenderness.  Musculoskeletal: Normal range of motion.  Neurological: She is alert and oriented to person, place, and time.  Skin: Skin is warm and dry. Rash noted.  Fine macro-papular rash on the trunk.   Psychiatric: She has a normal mood and affect. Her behavior is normal.      Results for orders placed in visit on 08/25/13  POCT CBC      Result Value Ref Range   WBC 2.7 (*) 4.6 - 10.2 K/uL   Lymph, poc 0.8  0.6 - 3.4   POC LYMPH PERCENT 31.0  10 - 50 %L   MID (cbc) 0.2  0 - 0.9   POC MID % 6.4  0 - 12 %M   POC Granulocyte 1.7 (*) 2 - 6.9   Granulocyte percent 62.6  37 - 80 %G   RBC 4.72  4.04 - 5.48 M/uL   Hemoglobin 14.4  12.2 - 16.2 g/dL   HCT, POC 16.1  09.6 - 47.9 %   MCV 95.0  80 - 97 fL   MCH, POC 30.5  27 - 31.2 pg   MCHC 32.1  31.8 - 35.4 g/dL   RDW, POC 04.5     Platelet Count, POC 79 (*) 142 - 424 K/uL   MPV 8.9  0 - 99.8 fL   Results for orders placed in visit on 08/25/13  POCT CBC      Result Value Ref Range   WBC 2.7 (*) 4.6 - 10.2 K/uL   Lymph, poc 0.8  0.6 - 3.4   POC LYMPH PERCENT 31.0  10 - 50 %L   MID (cbc) 0.2  0 - 0.9   POC MID % 6.4  0 - 12 %M   POC Granulocyte 1.7 (*) 2 - 6.9   Granulocyte percent 62.6  37 - 80 %G   RBC 4.72  4.04 - 5.48 M/uL   Hemoglobin 14.4  12.2 - 16.2 g/dL   HCT, POC 40.9  81.1 - 47.9 %   MCV 95.0  80 - 97 fL   MCH, POC 30.5  27 - 31.2 pg   MCHC 32.1  31.8 - 35.4 g/dL   RDW, POC 91.4     Platelet Count, POC 79 (*) 142 - 424 K/uL   MPV 8.9  0 - 99.8 fL  POCT URINALYSIS DIPSTICK      Result Value Ref Range   Color, UA dark yellow     Clarity, UA cloudy     Glucose, UA neg     Bilirubin, UA small     Ketones, UA 15     Spec Grav, UA 1.025     Blood, UA trace-lysed     pH, UA 5.5     Protein, UA 100     Urobilinogen, UA 1.0     Nitrite, UA neg     Leukocytes, UA Negative    POCT UA - MICROSCOPIC ONLY      Result Value Ref Range   WBC, Ur,  HPF, POC 0-1     RBC, urine, microscopic 0-1     Bacteria, U Microscopic neg     Mucus, UA neg     Epithelial cells, urine per micros neg     Crystals, Ur, HPF, POC neg     Casts, Ur, LPF, POC hyaline     Yeast, UA neg     Amorphous pos     [facility] reading (PRIMARY) by  Dr.Daub no acute disease no pneumonia seen      Assessment & Plan:   DIAGNOSTIC STUDIES: Oxygen Saturation is 97% on room air, normal by my interpretation.    COORDINATION OF CARE: Leukopenia with thrombocytopenia and  appears to be developing a rash. Right now spotted fever is definitely in the differential. I'm concerned about her age and her white count and felt like like she should probably be observed in the hospital for the next 24-48 hours while she starts treatment and  her counts can be followed closely.  I personally performed the services described in this documentation, which was scribed in my presence. The recorded information has been reviewed and is accurate.

## 2013-08-26 ENCOUNTER — Telehealth: Payer: Self-pay | Admitting: *Deleted

## 2013-08-26 LAB — COMPREHENSIVE METABOLIC PANEL
ALT: 119 U/L — ABNORMAL HIGH (ref 0–35)
AST: 145 U/L — ABNORMAL HIGH (ref 0–37)
Albumin: 2.8 g/dL — ABNORMAL LOW (ref 3.5–5.2)
Alkaline Phosphatase: 195 U/L — ABNORMAL HIGH (ref 39–117)
BILIRUBIN TOTAL: 1.4 mg/dL — AB (ref 0.3–1.2)
BUN: 12 mg/dL (ref 6–23)
CHLORIDE: 97 meq/L (ref 96–112)
CO2: 22 mEq/L (ref 19–32)
Calcium: 8.2 mg/dL — ABNORMAL LOW (ref 8.4–10.5)
Creatinine, Ser: 0.9 mg/dL (ref 0.50–1.10)
GFR calc non Af Amer: 61 mL/min — ABNORMAL LOW (ref >90)
GFR, EST AFRICAN AMERICAN: 71 mL/min — AB (ref >90)
Glucose, Bld: 117 mg/dL — ABNORMAL HIGH (ref 70–99)
Potassium: 4 mEq/L (ref 3.7–5.3)
Sodium: 133 mEq/L — ABNORMAL LOW (ref 137–147)
Total Protein: 5.9 g/dL — ABNORMAL LOW (ref 6.0–8.3)

## 2013-08-26 LAB — CBC
HCT: 35.5 % — ABNORMAL LOW (ref 36.0–46.0)
Hemoglobin: 11.9 g/dL — ABNORMAL LOW (ref 12.0–15.0)
MCH: 30.4 pg (ref 26.0–34.0)
MCHC: 33.5 g/dL (ref 30.0–36.0)
MCV: 90.6 fL (ref 78.0–100.0)
Platelets: 58 10*3/uL — ABNORMAL LOW (ref 150–400)
RBC: 3.92 MIL/uL (ref 3.87–5.11)
RDW: 13 % (ref 11.5–15.5)
WBC: 2.1 10*3/uL — ABNORMAL LOW (ref 4.0–10.5)

## 2013-08-26 LAB — URINE CULTURE: Colony Count: 25000

## 2013-08-26 MED ORDER — ONDANSETRON 8 MG/NS 50 ML IVPB
8.0000 mg | Freq: Four times a day (QID) | INTRAVENOUS | Status: DC | PRN
  Filled 2013-08-26 (×2): qty 8

## 2013-08-26 MED ORDER — VANCOMYCIN HCL 500 MG IV SOLR
500.0000 mg | Freq: Two times a day (BID) | INTRAVENOUS | Status: DC
  Administered 2013-08-27 – 2013-08-28 (×3): 500 mg via INTRAVENOUS
  Filled 2013-08-26 (×4): qty 500

## 2013-08-26 MED ORDER — DOXYCYCLINE HYCLATE 100 MG IV SOLR
100.0000 mg | Freq: Two times a day (BID) | INTRAVENOUS | Status: DC
  Administered 2013-08-27 – 2013-08-28 (×3): 100 mg via INTRAVENOUS
  Filled 2013-08-26 (×5): qty 100

## 2013-08-26 MED ORDER — VANCOMYCIN HCL IN DEXTROSE 1-5 GM/200ML-% IV SOLN
1000.0000 mg | Freq: Once | INTRAVENOUS | Status: AC
  Administered 2013-08-26: 1000 mg via INTRAVENOUS
  Filled 2013-08-26: qty 200

## 2013-08-26 MED ORDER — DOXYCYCLINE HYCLATE 100 MG IV SOLR
100.0000 mg | Freq: Two times a day (BID) | INTRAVENOUS | Status: DC
  Administered 2013-08-26 (×2): 100 mg via INTRAVENOUS
  Filled 2013-08-26 (×3): qty 100

## 2013-08-26 NOTE — Progress Notes (Signed)
Called and spoke to Dr. Link SnufferHolwerda regarding Gram + Cocci growing in blood cultures performed at Urgent Care yesterday. Orders obtained to stop Doxycycline and start Vancomycin 1GM IV once and then have pharmacy to consult on subsequent dosing.

## 2013-08-26 NOTE — Progress Notes (Signed)
Subjective: Patient did receive one dose of doxycycline last night, and this morning feels much better, did have some sweats last night, and measure temperatures, but eating breakfast, no nausea, vomiting, states that eggs are better, denies headache, visual changes, shortness of breath, chest pain, abdominal pain, nausea, vomiting, denies any pruritus.  Objective: Vital signs in last 24 hours: Temp:  [96.1 F (35.6 C)-102.8 F (39.3 C)] 98.5 F (36.9 C) (06/21 0523) Pulse Rate:  [70-86] 70 (06/21 0523) Resp:  [14-16] 16 (06/21 0523) BP: (111-122)/(63-79) 111/63 mmHg (06/21 0523) SpO2:  [94 %-99 %] 99 % (06/21 0523) Weight:  [65.772 kg (145 lb)-66.044 kg (145 lb 9.6 oz)] 65.772 kg (145 lb) (06/20 1526) Weight change:  Last BM Date: 08/24/13  CBG (last 3)  No results found for this basename: GLUCAP,  in the last 72 hours  Intake/Output from previous day: 06/20 0701 - 06/21 0700 In: 315 [I.V.:315] Out: 2300 [Urine:2300] Intake/Output this shift:   Pleasant, no apparent distress reading the newspaper, alert and oriented x3 Sclera anicteric extraconal movements are intact, no oropharyngeal lesions, dentition intact Lungs clear to auscultation bilaterally Vascular reveals regular rate and rhythm without murmurs rubs or gallops Abdomen-soft, nontender, nondistended, bowel sounds are present, no hepatosplenomegaly appreciated No edema, pedal pulses intact, no cyanosis, patient moves all 4 extremities without hindrance, no active synovitis Neurologic exam nonfocal Skin exam fine maculopapular rash less erythematous today, barely noticeable   Lab Results:  Recent Labs  08/25/13 1932 08/26/13 0320  NA 130* 133*  K 3.6* 4.0  CL 94* 97  CO2 20 22  GLUCOSE 157* 117*  BUN 14 12  CREATININE 0.84 0.90  CALCIUM 8.2* 8.2*    Recent Labs  08/25/13 1932 08/26/13 0320  AST 149* 145*  ALT 131* 119*  ALKPHOS 191* 195*  BILITOT 1.5* 1.4*  PROT 6.0 5.9*  ALBUMIN 2.8* 2.8*     Recent Labs  08/25/13 1932 08/26/13 0320  WBC 2.1* 2.1*  HGB 12.4 11.9*  HCT 36.4 35.5*  MCV 90.3 90.6  PLT 60* 58*   No results found for this basename: CKTOTAL, CKMB, CKMBINDEX, TROPONINI,  in the last 72 hours No results found for this basename: TSH, T4TOTAL, FREET3, T3FREE, THYROIDAB,  in the last 72 hours No results found for this basename: VITAMINB12, FOLATE, FERRITIN, TIBC, IRON, RETICCTPCT,  in the last 72 hours  Studies/Results: Dg Chest 2 View  08/25/2013   CLINICAL DATA:  Fever  EXAM: CHEST  2 VIEW  COMPARISON:  06/23/2012  FINDINGS: Heart size and vascular pattern are normal. Lungs are clear. Uncoiling of the aorta is stable.  IMPRESSION: No active cardiopulmonary disease.   Electronically Signed   By: Esperanza Heiraymond  Rubner M.D.   On: 08/25/2013 15:36     Medications: Scheduled: . beta carotene w/minerals  1 tablet Oral Daily  . doxycycline (VIBRAMYCIN) IV  100 mg Intravenous Q12H  . omega-3 acid ethyl esters  1 g Oral Daily   Continuous: . sodium chloride 75 mL/hr at 08/25/13 1748    Assessment/Plan: Active Problems:   Leukopenia Fever for 2-3 days with arthralgia, mild rash, given clinical setting of significant exposure to outdoor including tics, with thrombocytopenia, and benign physical exam otherwise including review of systems highly consistent with [facility] spotted fever with other entities less likely, no evidence of CNS disease, or significant deficits, we'll defer lumbar puncture. Patient has received one dose of doxycycline with significant clinical improvement based on her reports however complicated by elevated liver transaminases,  mild hyponatremia that improved overnight with IV fluids, mild increase in alkaline phosphatase and continued leukopenia and thrombocytopenia without bleeding complications. Please note that CBC 3 months ago with annual physical exam was normal.  chest x-ray normal  Our plan will be to continue IV doxycycline, provide IV  fluids, followup on blood cultures given high fevers, clinically improved despite stable abnormal liver function tests, and CBC. If continue doxycycline does not result in improvement consider hepatitis panel, will check serologies at this time however this likely will provide only diagnostic reassurance for the future given that serologies will probably be negative at this time [facility]. We'll continue Tylenol for symptom management, Thrombo cytopenia-presumably secondary to current infection in conjunction with leukopenia, will recheck CBC in the morning home and no bleeding complications or history of bleeding.  Hyponatremia resolving with IV fluid will monitor will defer pharmacologic DVT prophylaxis secondary to thrombocytopenia, and use SCDs.    LOS: 1 day   AVVA,RAVISANKAR R 08/26/2013, 8:30 AM

## 2013-08-26 NOTE — Telephone Encounter (Signed)
Dr. Barbaraann Fasteraub FYI  Amy from [facility] called about critical lab on blood culture. She stated that both anaerobic and aerobic both were gram positive cocci in clusters.   Advised Benny LennertSarah Weber of results and was told to call to let hospital know.  I called hospital to inform nurse, Darlene, that blood culture was positive.

## 2013-08-27 ENCOUNTER — Inpatient Hospital Stay (HOSPITAL_COMMUNITY): Payer: Medicare Other

## 2013-08-27 DIAGNOSIS — A7740 Ehrlichiosis, unspecified: Secondary | ICD-10-CM | POA: Insufficient documentation

## 2013-08-27 DIAGNOSIS — D61818 Other pancytopenia: Secondary | ICD-10-CM | POA: Diagnosis present

## 2013-08-27 DIAGNOSIS — R7989 Other specified abnormal findings of blood chemistry: Secondary | ICD-10-CM | POA: Diagnosis present

## 2013-08-27 DIAGNOSIS — R945 Abnormal results of liver function studies: Secondary | ICD-10-CM

## 2013-08-27 DIAGNOSIS — R509 Fever, unspecified: Principal | ICD-10-CM

## 2013-08-27 HISTORY — DX: Fever, unspecified: R50.9

## 2013-08-27 LAB — B. BURGDORFI ANTIBODIES: B BURGDORFERI AB IGG+ IGM: 0.39 {ISR}

## 2013-08-27 LAB — CBC
HCT: 33.8 % — ABNORMAL LOW (ref 36.0–46.0)
Hemoglobin: 11.6 g/dL — ABNORMAL LOW (ref 12.0–15.0)
MCH: 30.9 pg (ref 26.0–34.0)
MCHC: 34.3 g/dL (ref 30.0–36.0)
MCV: 90.1 fL (ref 78.0–100.0)
Platelets: 56 10*3/uL — ABNORMAL LOW (ref 150–400)
RBC: 3.75 MIL/uL — AB (ref 3.87–5.11)
RDW: 13 % (ref 11.5–15.5)
WBC: 2.9 10*3/uL — AB (ref 4.0–10.5)

## 2013-08-27 LAB — COMPREHENSIVE METABOLIC PANEL
ALT: 113 U/L — AB (ref 0–35)
AST: 138 U/L — ABNORMAL HIGH (ref 0–37)
Albumin: 2.6 g/dL — ABNORMAL LOW (ref 3.5–5.2)
Alkaline Phosphatase: 255 U/L — ABNORMAL HIGH (ref 39–117)
BILIRUBIN TOTAL: 1 mg/dL (ref 0.3–1.2)
BUN: 8 mg/dL (ref 6–23)
CHLORIDE: 103 meq/L (ref 96–112)
CO2: 23 meq/L (ref 19–32)
Calcium: 7.9 mg/dL — ABNORMAL LOW (ref 8.4–10.5)
Creatinine, Ser: 0.73 mg/dL (ref 0.50–1.10)
GFR, EST NON AFRICAN AMERICAN: 82 mL/min — AB (ref >90)
Glucose, Bld: 109 mg/dL — ABNORMAL HIGH (ref 70–99)
Potassium: 3.4 mEq/L — ABNORMAL LOW (ref 3.7–5.3)
SODIUM: 138 meq/L (ref 137–147)
Total Protein: 5.7 g/dL — ABNORMAL LOW (ref 6.0–8.3)

## 2013-08-27 LAB — HIV ANTIBODY (ROUTINE TESTING W REFLEX): HIV 1&2 Ab, 4th Generation: NONREACTIVE

## 2013-08-27 LAB — B. BURGDORFI ANTIBODIES BY WB
B burgdorferi IgG Abs (IB): NEGATIVE
B burgdorferi IgM Abs (IB): NEGATIVE

## 2013-08-27 MED ORDER — WHITE PETROLATUM GEL
Status: AC
  Administered 2013-08-27: 12:00:00
  Filled 2013-08-27: qty 5

## 2013-08-27 NOTE — Progress Notes (Signed)
ANTIBIOTIC CONSULT NOTE - INITIAL  Pharmacy Consult for Vancomycin Indication: GPC growing in blood culture from Urgent Care  No Known Allergies  Patient Measurements: Height: 5\' 6"  (167.6 cm) Weight: 145 lb (65.772 kg) IBW/kg (Calculated) : 59.3  Vital Signs: Temp: 98.2 F (36.8 C) (06/22 0551) BP: 138/75 mmHg (06/22 0551) Pulse Rate: 70 (06/22 0551) Intake/Output from previous day: 06/21 0701 - 06/22 0700 In: 2622 [P.O.:222; I.V.:2400] Out: 3800 [Urine:3800] Intake/Output from this shift: Total I/O In: 360 [P.O.:360] Out: 600 [Urine:600]  Labs:  Recent Labs  08/25/13 1932 08/26/13 0320 08/27/13 0315  WBC 2.1* 2.1* 2.9*  HGB 12.4 11.9* 11.6*  PLT 60* 58* 56*  CREATININE 0.84 0.90 0.73   Estimated Creatinine Clearance: 57.8 ml/min (by C-G formula based on Cr of 0.73). No results found for this basename: VANCOTROUGH, Leodis BinetVANCOPEAK, VANCORANDOM, GENTTROUGH, GENTPEAK, GENTRANDOM, TOBRATROUGH, TOBRAPEAK, TOBRARND, AMIKACINPEAK, AMIKACINTROU, AMIKACIN,  in the last 72 hours   Microbiology: Recent Results (from the past 720 hour(s))  CULTURE, BLOOD (SINGLE)     Status: None   Collection Time    08/25/13  1:29 PM      Result Value Ref Range Status   Preliminary Report STAPHYLOCOCCUS SPECIES   Preliminary  URINE CULTURE     Status: None   Collection Time    08/25/13  1:29 PM      Result Value Ref Range Status   Colony Count 25,000 COLONIES/ML   Final   Organism ID, Bacteria Multiple bacterial morphotypes present, none   Final   Organism ID, Bacteria predominant. Suggest appropriate recollection if    Final   Organism ID, Bacteria clinically indicated.   Final  CULTURE, BLOOD (ROUTINE X 2)     Status: None   Collection Time    08/25/13  7:32 PM      Result Value Ref Range Status   Specimen Description BLOOD RIGHT ARM   Final   Special Requests     Final   Value: BOTTLES DRAWN AEROBIC AND ANAEROBIC 10CC BLUE, 5CC RED   Culture  Setup Time     Final   Value:  08/26/2013 14:37     [facility]   Culture     Final   Value:        BLOOD CULTURE RECEIVED NO GROWTH TO DATE CULTURE WILL BE HELD FOR 5 DAYS BEFORE ISSUING A FINAL NEGATIVE REPORT     [facility]formed at [facility]   Report Status PENDING   Incomplete  CULTURE, BLOOD (ROUTINE X 2)     Status: None   Collection Time    08/25/13  7:42 PM      Result Value Ref Range Status   Specimen Description BLOOD RIGHT HAND   Final   Special Requests BOTTLES DRAWN AEROBIC AND ANAEROBIC 10CC EACH   Final   Culture  Setup Time     Final   Value: 08/26/2013 14:37     [facility]formed at [facility]   Culture     Final   Value:        BLOOD CULTURE RECEIVED NO GROWTH TO DATE CULTURE WILL BE HELD FOR 5 DAYS BEFORE ISSUING A FINAL NEGATIVE REPORT     Performed at [facility]   Report Status PENDING   Incomplete    Medical History: Past Medical History  Diagnosis Date  . Osteoporosis   . Heart murmur   . Cataract   . Blood dyscrasia     Medications:  Prescriptions prior to admission  Medication Sig Dispense Refill  . beta carotene w/minerals (OCUVITE) tablet Take 1 tablet by mouth daily.      . Calcium Carb-Cholecalciferol (CALCIUM 600/VITAMIN D3) 600-800 MG-UNIT TABS Take 1 tablet by mouth daily.      . Garlic (GARLIQUE PO) Take 1 tablet by mouth every morning.      . Omega-3 Fatty Acids (FISH OIL) 600 MG CAPS Take 1 capsule by mouth daily at 12 noon.       Assessment: 73 y.o. female presented to Urgent Care with fever, rash, recent tick exposures, diarrhea - sent to hospital to be admitted. [facility] lab called 6/21 p.m. with GPC in blood culture from Urgent Care (only one blood culture drawn) - preliminary results show staphylococcus species. Noted could be contaminant - repeat cultures pending. MD ordered Vanc 1gm last night then pharmacy to dose - dose entered but note not entered. Pt also on doxycycline for possible tick-borne illness. Titers pending. Wbc  low. Tm 100.1.  Goal of Therapy:  Vancomycin trough level 15-20 mcg/ml  Plan:  1) Vancomycin 500mg  IV q12h 2) Will f/u micro data, renal function, pt's clinical condition, trough prn  Christoper Fabianaron Amend, PharmD, BCPS Clinical pharmacist, pager [phone] 08/27/2013,9:50 AM

## 2013-08-27 NOTE — Progress Notes (Signed)
Subjective: History reviewed with patient.  She feels better over past 48 hours with improvement in fever.  Rash has not worsened.  No headache, neck stiffness.  She does endorse bisexual relationship in the past, none recently and no recent sexual activity.  No travel, sick contacts that she is aware of.  Multiple tick exposures, less mosquito bites.  No preceding symptoms prior to acute illness.  No recent dental work.  Objective: Vital signs in last 24 hours: Temp:  [99.1 F (37.3 C)-100.1 F (37.8 C)] 99.1 F (37.3 C) (06/21 2150) Pulse Rate:  [67-77] 67 (06/21 2150) Resp:  [17] 17 (06/21 2150) BP: (110-145)/(64-75) 110/64 mmHg (06/21 2150) SpO2:  [96 %-98 %] 96 % (06/21 2150) Weight change:  Last BM Date: 08/26/13  CBG (last 3)  No results found for this basename: GLUCAP,  in the last 72 hours  Intake/Output from previous day: 06/21 0701 - 06/22 0700 In: 2022 [P.O.:222; I.V.:1800] Out: 2000 [Urine:2000] Intake/Output this shift:    General appearance: alert and no distress Neck: no nuchal rigidity Eyes: no scleral icterus Throat: oropharynx moist without erythema Resp: clear to auscultation bilaterally Cardio: regular rate and rhythm and with grade 2/6 SEM over upper sternal border GI: soft, non-tender; bowel sounds normal; no masses,  no organomegaly Extremities: no clubbing, cyanosis or edema; no splinter hemorrhages, nodes Skin: fine lacy rash over abdomen and lower chest; no involvement of palms, soles or extremities  Lab Results:  Recent Labs  08/26/13 0320 08/27/13 0315  NA 133* 138  K 4.0 3.4*  CL 97 103  CO2 22 23  GLUCOSE 117* 109*  BUN 12 8  CREATININE 0.90 0.73  CALCIUM 8.2* 7.9*    Recent Labs  08/26/13 0320 08/27/13 0315  AST 145* 138*  ALT 119* 113*  ALKPHOS 195* 255*  BILITOT 1.4* 1.0  PROT 5.9* 5.7*  ALBUMIN 2.8* 2.6*    Recent Labs  08/26/13 0320 08/27/13 0315  WBC 2.1* 2.9*  HGB 11.9* 11.6*  HCT 35.5* 33.8*  MCV 90.6 90.1   PLT 58* 56*   Blood Cultures- + Gram Positive Cocci in clusters X 1; others pending RMSF, Lyme, Ehrlichia titers pending  Studies/Results: Dg Chest 2 View  08/25/2013   CLINICAL DATA:  Fever  EXAM: CHEST  2 VIEW  COMPARISON:  06/23/2012  FINDINGS: Heart size and vascular pattern are normal. Lungs are clear. Uncoiling of the aorta is stable.  IMPRESSION: No active cardiopulmonary disease.   Electronically Signed   By: Esperanza Heiraymond  Rubner M.D.   On: 08/25/2013 15:36     Medications: Scheduled: . beta carotene w/minerals  1 tablet Oral Daily  . doxycycline (VIBRAMYCIN) IV  100 mg Intravenous Q12H  . omega-3 acid ethyl esters  1 g Oral Daily  . vancomycin  500 mg Intravenous Q12H   Continuous: . sodium chloride 75 mL/hr at 08/26/13 [id number]    Assessment/Plan: Active Problems: 1. Fever, unspecified- broad differential includes arthropod/tick-borne illness, viral syndrome, bacterial causes.  Less likely rheumatologic given acute onset.  Blood cultures growing GPC X 1 (?contaminant vs. Source of illness).  No peripheral stigmata of SBE.  Will add on EBV, CMV titers and HIV antibody.  RMSF, Lyme titers pending.  I added on Ehrlichia titers last evening.  LP not indicated- differential includes West Nile Virus but she is non-toxic appearing.  Continue empiric Vancomycin and Doxycycline- this appears to be helping.  ID consult for further recommendations, if any. 2. Pancytopenia- blood counts are slightly improved.  Monitor.   3. Rash- atypical for RMSF.  Not progressing.   4. Elevated liver function tests- likely secondary to underlying infection.  Will obtain RUQ ultrasound for further evaluation. 5. Gram Positive Bacteremia- Blood cultures growing GPC in clusters X 1- will continue Vancomycin pending final cultures- may be contaminant MSSA vs. fever source.  Cultures repeated. 6. Disposition- possible discharge in 1-2 days pending culture results, evolution of abnormal labs.   LOS: 2 days   Martha ClanShaw,  William 08/27/2013, 7:34 AM

## 2013-08-27 NOTE — Consult Note (Signed)
[facility]  Total days of antibiotics 3        Day 3 doxycycline        Day 2 vancomycin               Reason for Consult: fever, leukopenia, rash concern for tick bite illness   Referring Physician: shaw  Active Problems:   Fever, unspecified   Pancytopenia   Elevated liver function tests    HPI: Katherine Atkins is a 73 y.o. female who was seen by pcp on 6/20 and admitted due to having 2 days of high fever of temp 102.7, HA and rash. She states that she often works outside. During the first week of June, she spent long days outdoors installing fence. Despite bug spray, and change of clothing, she reported finding ticks 5-7 on her for which she removed. She states that she was doing well up until June 18th that evening after spending the day working on refinishing her deck, she had abrupt onset of chills, feverish, myalgia, arthralgias and headache. The following day again felt poorly with high fevers of 102.29F, took tylenol and improved her fever. On 6/20 due to having high fever, she wa seen in urgent care who noted fever, macular lacy blanching rash to torso, and had her admitted for concern of tick borne illness vs other bacterial infection. On admit, did have temp of 102.42F but also marked lab abnormalities.leukopenia of wbc 2.7, anc 1.7.plt 60, na of 130, k+ 4.1, alp 218, alt 152, ast 176, tibil 1.8. No other sick contacts or recent travel. Vancomycin added yesterday since blood cx from urgent care identified gpcc.  Past Medical History  Diagnosis Date  . Osteoporosis   . Heart murmur   . Cataract   . Blood dyscrasia     Allergies: No Known Allergies   MEDICATIONS: . beta carotene w/minerals  1 tablet Oral Daily  . doxycycline (VIBRAMYCIN) IV  100 mg Intravenous Q12H  . omega-3 acid ethyl esters  1 g Oral Daily  . vancomycin  500 mg Intravenous Q12H  . white petrolatum        History  Substance Use Topics  . Smoking status: Never Smoker   .  Smokeless tobacco: Never Used  . Alcohol Use: No    Family History  Problem Relation Age of Onset  . Kidney disease Father   . Heart disease Maternal Grandfather   . Heart disease Paternal Grandmother      Review of Systems  Constitutional: positive for fever, chills, diaphoresis, activity change, appetite change, fatigue and unexpected weight change.  HENT: Negative for congestion, sore throat, rhinorrhea, sneezing, trouble swallowing and sinus pressure.  Eyes: Negative for photophobia and visual disturbance.  Respiratory: Negative for cough, chest tightness, shortness of breath, wheezing and stridor.  Cardiovascular: Negative for chest pain, palpitations and leg swelling.  Gastrointestinal: Negative for nausea, vomiting, abdominal pain, diarrhea, constipation, blood in stool, abdominal distention and anal bleeding.  Genitourinary: Negative for dysuria, hematuria, flank pain and difficulty urinating.  Musculoskeletal: Negative for myalgias, back pain, joint swelling, arthralgias and gait problem.  Skin: + rash. Negative for color change, pallor,and wound.  Neurological: Negative for dizziness, tremors, weakness and light-headedness.  Hematological: Negative for adenopathy. Does not bruise/bleed easily.  Psychiatric/Behavioral: Negative for behavioral problems, confusion, sleep disturbance, dysphoric mood, decreased concentration and agitation.     OBJECTIVE: Temp:  [98.2 F (36.8 C)-100.1 F (37.8 C)] 98.2 F (36.8 C) (06/22 0551) Pulse Rate:  [  67-77] 70 (06/22 0551) Resp:  [17] 17 (06/22 0551) BP: (110-145)/(64-75) 138/75 mmHg (06/22 0551) SpO2:  [96 %-98 %] 96 % (06/22 0551) Physical Exam  Constitutional:  oriented to person, place, and time. appears well-developed and well-nourished. No distress. Younger than stated age HENT:  Mouth/Throat: Oropharynx is clear and moist. No oropharyngeal exudate. Scleral icterus Cardiovascular: Normal rate, regular rhythm and normal heart  sounds. Exam reveals no gallop and no friction rub.  No murmur heard.  Pulmonary/Chest: Effort normal and breath sounds normal. No respiratory distress.  has no wheezes.  Abdominal: Soft. Bowel sounds are normal.  exhibits no distension. There is no tenderness.  Lymphadenopathy: no cervical adenopathy.  Neurological: alert and oriented to person, place, and time.  Skin: Skin is warm and dry. Macular lacy rash to torso blanching, spares extremities, or palms or soles Psychiatric: a normal mood and affect.  behavior is normal.   LABS: Results for orders placed during the hospital encounter of 08/25/13 (from the past 48 hour(s))  URINALYSIS, ROUTINE W REFLEX MICROSCOPIC     Status: Abnormal   Collection Time    08/25/13  5:20 PM      Result Value Ref Range   Color, Urine AMBER (*) YELLOW   Comment: BIOCHEMICALS MAY BE AFFECTED BY COLOR   APPearance CLEAR  CLEAR   Specific Gravity, Urine 1.014  1.005 - 1.030   pH 6.0  5.0 - 8.0   Glucose, UA NEGATIVE  NEGATIVE mg/dL   Hgb urine dipstick NEGATIVE  NEGATIVE   Bilirubin Urine NEGATIVE  NEGATIVE   Ketones, ur 15 (*) NEGATIVE mg/dL   Protein, ur NEGATIVE  NEGATIVE mg/dL   Urobilinogen, UA 1.0  0.0 - 1.0 mg/dL   Nitrite NEGATIVE  NEGATIVE   Leukocytes, UA NEGATIVE  NEGATIVE   Comment: MICROSCOPIC NOT DONE ON URINES WITH NEGATIVE PROTEIN, BLOOD, LEUKOCYTES, NITRITE, OR GLUCOSE <1000 mg/dL.  COMPREHENSIVE METABOLIC PANEL     Status: Abnormal   Collection Time    08/25/13  7:32 PM      Result Value Ref Range   Sodium 130 (*) 137 - 147 mEq/L   Potassium 3.6 (*) 3.7 - 5.3 mEq/L   Chloride 94 (*) 96 - 112 mEq/L   CO2 20  19 - 32 mEq/L   Glucose, Bld 157 (*) 70 - 99 mg/dL   BUN 14  6 - 23 mg/dL   Creatinine, Ser 0.84  0.50 - 1.10 mg/dL   Calcium 8.2 (*) 8.4 - 10.5 mg/dL   Total Protein 6.0  6.0 - 8.3 g/dL   Albumin 2.8 (*) 3.5 - 5.2 g/dL   AST 149 (*) 0 - 37 U/L   ALT 131 (*) 0 - 35 U/L   Alkaline Phosphatase 191 (*) 39 - 117 U/L   Total  Bilirubin 1.5 (*) 0.3 - 1.2 mg/dL   GFR calc non Af Amer 67 (*) >90 mL/min   GFR calc Af Amer 77 (*) >90 mL/min   Comment: (NOTE)     The eGFR has been calculated using the CKD EPI equation.     This calculation has not been validated in all clinical situations.     eGFR's persistently <90 mL/min signify possible Chronic Kidney     Disease.  CBC     Status: Abnormal   Collection Time    08/25/13  7:32 PM      Result Value Ref Range   WBC 2.1 (*) 4.0 - 10.5 K/uL   RBC 4.03  3.87 -  5.11 MIL/uL   Hemoglobin 12.4  12.0 - 15.0 g/dL   HCT 36.4  36.0 - 46.0 %   MCV 90.3  78.0 - 100.0 fL   MCH 30.8  26.0 - 34.0 pg   MCHC 34.1  30.0 - 36.0 g/dL   RDW 13.0  11.5 - 15.5 %   Platelets 60 (*) 150 - 400 K/uL   Comment: PLATELET COUNT CONFIRMED BY SMEAR  SEDIMENTATION RATE     Status: None   Collection Time    08/25/13  7:32 PM      Result Value Ref Range   Sed Rate 5  0 - 22 mm/hr  CULTURE, BLOOD (ROUTINE X 2)     Status: None   Collection Time    08/25/13  7:32 PM      Result Value Ref Range   Specimen Description BLOOD RIGHT ARM     Special Requests       Value: BOTTLES DRAWN AEROBIC AND ANAEROBIC 10CC BLUE, 5CC RED   Culture  Setup Time       Value: 08/26/2013 14:37     Performed at [facility]   Culture       Value:        BLOOD CULTURE RECEIVED NO GROWTH TO DATE CULTURE WILL BE HELD FOR 5 DAYS BEFORE ISSUING A FINAL NEGATIVE REPORT     Performed at [facility]   Report Status PENDING    CULTURE, BLOOD (ROUTINE X 2)     Status: None   Collection Time    08/25/13  7:42 PM      Result Value Ref Range   Specimen Description BLOOD RIGHT HAND     Special Requests BOTTLES DRAWN AEROBIC AND ANAEROBIC 10CC EACH     Culture  Setup Time       Value: 08/26/2013 14:37     Performed at [facility]   Culture       Value:        BLOOD CULTURE RECEIVED NO GROWTH TO DATE CULTURE WILL BE HELD FOR 5 DAYS BEFORE ISSUING A FINAL NEGATIVE REPORT     Performed at [facility]   Report Status PENDING    COMPREHENSIVE METABOLIC PANEL     Status: Abnormal   Collection Time    08/26/13  3:20 AM      Result Value Ref Range   Sodium 133 (*) 137 - 147 mEq/L   Potassium 4.0  3.7 - 5.3 mEq/L   Chloride 97  96 - 112 mEq/L   CO2 22  19 - 32 mEq/L   Glucose, Bld 117 (*) 70 - 99 mg/dL   BUN 12  6 - 23 mg/dL   Creatinine, Ser 0.90  0.50 - 1.10 mg/dL   Calcium 8.2 (*) 8.4 - 10.5 mg/dL   Total Protein 5.9 (*) 6.0 - 8.3 g/dL   Albumin 2.8 (*) 3.5 - 5.2 g/dL   AST 145 (*) 0 - 37 U/L   ALT 119 (*) 0 - 35 U/L   Alkaline Phosphatase 195 (*) 39 - 117 U/L   Total Bilirubin 1.4 (*) 0.3 - 1.2 mg/dL   GFR calc non Af Amer 61 (*) >90 mL/min   GFR calc Af Amer 71 (*) >90 mL/min   Comment: (NOTE)     The eGFR has been calculated using the CKD EPI equation.     This calculation has not been validated in all clinical situations.  eGFR's persistently <90 mL/min signify possible Chronic Kidney     Disease.  CBC     Status: Abnormal   Collection Time    08/26/13  3:20 AM      Result Value Ref Range   WBC 2.1 (*) 4.0 - 10.5 K/uL   RBC 3.92  3.87 - 5.11 MIL/uL   Hemoglobin 11.9 (*) 12.0 - 15.0 g/dL   HCT 73.0 (*) 28.8 - 55.6 %   MCV 90.6  78.0 - 100.0 fL   MCH 30.4  26.0 - 34.0 pg   MCHC 33.5  30.0 - 36.0 g/dL   RDW 36.3  30.1 - 97.3 %   Platelets 58 (*) 150 - 400 K/uL   Comment: CONSISTENT WITH PREVIOUS RESULT  ROCKY MTN SPOTTED FVR AB, IGG-BLOOD     Status: None   Collection Time    08/26/13 10:00 AM      Result Value Ref Range   RMSF IgG 0.15     Comment: (NOTE)     IV = Index Value     REFERENCE INTERVAL     [facility] Spotted Fever Antibody, IgG     IgG (IV)     Result              Interpretation:     ---------   ---------             ---------------     <0.80       Negative    No significant level of Rickettsia rickettsii                            IgG antibody detected. If clinically                            indicated, repeat sample within  7-14 days.     0.80-1.20   Equivocal   Questionable presence of Rickettsia rickettsii                            IgG detected. Recommend recollecting                            and retesting, if clinically indicated.     >1.20       Positive    Presence of IgG antibody to Rickettsia                            rickettsii detected.  IgG indicates evidence                            of prior infection and may not indicate                            active disease.     Performed at [facility]  CBC     Status: Abnormal   Collection Time    08/27/13  3:15 AM      Result Value Ref Range   WBC 2.9 (*) 4.0 - 10.5 K/uL   RBC 3.75 (*) 3.87 - 5.11 MIL/uL   Hemoglobin 11.6 (*) 12.0 - 15.0 g/dL   HCT 61.4 (*) 92.9 - 88.2 %   MCV  90.1  78.0 - 100.0 fL   MCH 30.9  26.0 - 34.0 pg   MCHC 34.3  30.0 - 36.0 g/dL   RDW 13.0  11.5 - 15.5 %   Platelets 56 (*) 150 - 400 K/uL   Comment: CONSISTENT WITH PREVIOUS RESULT  COMPREHENSIVE METABOLIC PANEL     Status: Abnormal   Collection Time    08/27/13  3:15 AM      Result Value Ref Range   Sodium 138  137 - 147 mEq/L   Potassium 3.4 (*) 3.7 - 5.3 mEq/L   Chloride 103  96 - 112 mEq/L   CO2 23  19 - 32 mEq/L   Glucose, Bld 109 (*) 70 - 99 mg/dL   BUN 8  6 - 23 mg/dL   Creatinine, Ser 0.73  0.50 - 1.10 mg/dL   Calcium 7.9 (*) 8.4 - 10.5 mg/dL   Total Protein 5.7 (*) 6.0 - 8.3 g/dL   Albumin 2.6 (*) 3.5 - 5.2 g/dL   AST 138 (*) 0 - 37 U/L   ALT 113 (*) 0 - 35 U/L   Alkaline Phosphatase 255 (*) 39 - 117 U/L   Total Bilirubin 1.0  0.3 - 1.2 mg/dL   GFR calc non Af Amer 82 (*) >90 mL/min   GFR calc Af Amer >90  >90 mL/min   Comment: (NOTE)     The eGFR has been calculated using the CKD EPI equation.     This calculation has not been validated in all clinical situations.     eGFR's persistently <90 mL/min signify possible Chronic Kidney     Disease.    MICRO: 6/20 blood cx x 2 ngtd 6/21 blood cx x 2 ngtd 6/20 1300 blood cx x1 staph  species IMAGING: Dg Chest 2 View  08/25/2013   CLINICAL DATA:  Fever  EXAM: CHEST  2 VIEW  COMPARISON:  06/23/2012  FINDINGS: Heart size and vascular pattern are normal. Lungs are clear. Uncoiling of the aorta is stable.  IMPRESSION: No active cardiopulmonary disease.   Electronically Signed   By: Skipper Cliche M.D.   On: 08/25/2013 15:36   Assessment/Plan:  73 yo. F in good state of health up until 6/18 presenting with fever, malaise, found to have leukopenia, hyponatremia, mild transaminitis, with tick bite + history in the last 2 wk, empirically started on doxycycline and having improvement, no longer being febrile, and improved leukopenia  - continue with doxycycline. Await results of RMSF, ehrlichia, lyme disease. Agree that rash is not classic for these disease - bacteremia = likely contaminant. Spoke with micro lab who feel it has features of CoNS, to be finalized tomorrow. She has 4 other sets NGTD. Can keep vancomycin on board for now but likely d/c in 48hrs. - transaminitis = likely for tickborne illness, and will steadily return to baseline. Can consider checking for viral hepatitis is not improved  Kierstynn Babich B. [facility] [phone]

## 2013-08-28 ENCOUNTER — Encounter (HOSPITAL_COMMUNITY): Payer: Self-pay | Admitting: General Practice

## 2013-08-28 DIAGNOSIS — R21 Rash and other nonspecific skin eruption: Secondary | ICD-10-CM

## 2013-08-28 DIAGNOSIS — R5381 Other malaise: Secondary | ICD-10-CM

## 2013-08-28 DIAGNOSIS — D72819 Decreased white blood cell count, unspecified: Secondary | ICD-10-CM

## 2013-08-28 DIAGNOSIS — R7989 Other specified abnormal findings of blood chemistry: Secondary | ICD-10-CM

## 2013-08-28 DIAGNOSIS — E871 Hypo-osmolality and hyponatremia: Secondary | ICD-10-CM

## 2013-08-28 DIAGNOSIS — D696 Thrombocytopenia, unspecified: Secondary | ICD-10-CM

## 2013-08-28 DIAGNOSIS — R5383 Other fatigue: Secondary | ICD-10-CM

## 2013-08-28 LAB — COMPREHENSIVE METABOLIC PANEL
ALBUMIN: 2.9 g/dL — AB (ref 3.5–5.2)
ALT: 140 U/L — ABNORMAL HIGH (ref 0–35)
AST: 167 U/L — AB (ref 0–37)
Alkaline Phosphatase: 310 U/L — ABNORMAL HIGH (ref 39–117)
BUN: 7 mg/dL (ref 6–23)
CALCIUM: 8.3 mg/dL — AB (ref 8.4–10.5)
CHLORIDE: 102 meq/L (ref 96–112)
CO2: 25 mEq/L (ref 19–32)
CREATININE: 0.55 mg/dL (ref 0.50–1.10)
GFR calc Af Amer: >90 mL/min (ref >90)
Glucose, Bld: 94 mg/dL (ref 70–99)
Potassium: 3.6 mEq/L — ABNORMAL LOW (ref 3.7–5.3)
Sodium: 140 mEq/L (ref 137–147)
Total Bilirubin: 0.7 mg/dL (ref 0.3–1.2)
Total Protein: 6 g/dL (ref 6.0–8.3)

## 2013-08-28 LAB — CBC
HEMATOCRIT: 35.8 % — AB (ref 36.0–46.0)
Hemoglobin: 12.2 g/dL (ref 12.0–15.0)
MCH: 31 pg (ref 26.0–34.0)
MCHC: 34.1 g/dL (ref 30.0–36.0)
MCV: 91.1 fL (ref 78.0–100.0)
Platelets: 83 10*3/uL — ABNORMAL LOW (ref 150–400)
RBC: 3.93 MIL/uL (ref 3.87–5.11)
RDW: 13.2 % (ref 11.5–15.5)
WBC: 5.6 10*3/uL (ref 4.0–10.5)

## 2013-08-28 LAB — CMV ANTIBODY, IGG (EIA): CMV AB - IGG: 2.3 U/mL — AB (ref <0.60)

## 2013-08-28 LAB — EBV AB TO VIRAL CAPSID AG PNL, IGG+IGM: EBV VCA IgM: <10 U/mL (ref <36.0)

## 2013-08-28 LAB — CULTURE, BLOOD (SINGLE)

## 2013-08-28 LAB — CMV IGM: CMV IgM: <8 AU/mL (ref <30.00)

## 2013-08-28 MED ORDER — DOXYCYCLINE HYCLATE 100 MG PO TABS: 100.0000 mg | ORAL_TABLET | Freq: Two times a day (BID) | ORAL | Status: AC

## 2013-08-28 MED ORDER — ACETAMINOPHEN 325 MG PO TABS: 650.0000 mg | ORAL_TABLET | Freq: Four times a day (QID) | ORAL | Status: DC | PRN

## 2013-08-28 NOTE — Progress Notes (Signed)
Discharged home in good condition, accompanied by friend.

## 2013-08-28 NOTE — Discharge Summary (Signed)
DISCHARGE SUMMARY  Katherine Atkins  MR#: [id number]  DOB:01-20-1941  Date of Admission: 08/25/2013 Date of Discharge: 08/28/2013  Attending Physician:Tephanie Escorcia, Gwyndolyn Saxon  Patient's PCP: Burnard Bunting  Consults:  Carlyle Basques, MD- Infectious Diseases  Discharge Diagnoses: Active Problems:   Fever, unspecified   Pancytopenia   Elevated liver function tests   Rash and nonspecific skin eruption  Past Medical History  Diagnosis Date  . Osteoporosis   . Heart murmur   . Cataract   . Blood dyscrasia    Past Surgical History  Procedure Laterality Date  . Eye surgery    . Cataract extraction  2013    Discharge Medications:   Medication List         acetaminophen 325 MG tablet  Commonly known as:  TYLENOL  Take 2 tablets (650 mg total) by mouth every 6 (six) hours as needed for mild pain (or Fever >/= 101).     beta carotene w/minerals tablet  Take 1 tablet by mouth daily.     CALCIUM 600/VITAMIN D3 600-800 MG-UNIT Tabs  Generic drug:  Calcium Carb-Cholecalciferol  Take 1 tablet by mouth daily.     doxycycline 100 MG tablet  Commonly known as:  VIBRA-TABS  Take 1 tablet (100 mg total) by mouth 2 (two) times daily.     Fish Oil 600 MG Caps  Take 1 capsule by mouth daily at 12 noon.     GARLIQUE PO  Take 1 tablet by mouth every morning.        Hospital Procedures: Dg Chest 2 View  08/25/2013   CLINICAL DATA:  Fever  EXAM: CHEST  2 VIEW  COMPARISON:  06/23/2012  FINDINGS: Heart size and vascular pattern are normal. Lungs are clear. Uncoiling of the aorta is stable.  IMPRESSION: No active cardiopulmonary disease.   Electronically Signed   By: Skipper Cliche M.D.   On: 08/25/2013 15:36   US Abdomen Limited Ruq  08/27/2013   CLINICAL DATA:  Elevated liver function tests.  Fever.  EXAM: US ABDOMEN LIMITED - RIGHT UPPER QUADRANT  COMPARISON:  None.  FINDINGS: Gallbladder:  No gallstones or wall thickening visualized. No sonographic Murphy sign noted.  Common bile duct:   Diameter: 3 mm  Liver:  No focal lesion identified. Within normal limits in parenchymal echogenicity.  IMPRESSION: Normal right upper quadrant ultrasound.   Electronically Signed   By: Lajean Manes M.D.   On: 08/27/2013 15:57    History of Present Illness: 73 year old female active on a written form with a past medical history significant for mild osteoarthritis, osteoporosis, allergic rhinitis and mild hyperlipidemia on no active prescription medications except for intravenous Reclast last administered last year. She does have some issues with chronic mild cough secondary to postnasal drip from allergies.  Patient and noted a 2-3 day history of intermittent fevers up 102.53F with general malaise some arthralgias associated with high fever some cold intolerance as a result, but denies any visual changes headaches, difficulty swallowing, does have chronic dry cough, which is unchanged per patient report, denies any shortness of breath, wheezing, bronchospasm, chest pain, palpitations, orthopnea, presyncope or syncope, denies nausea, vomiting, constipation, blood in stool however did have 2 episodes of diarrhea without blood last Thursday, denies any lower extremity edema, dysuria, frequency, gross hematuria. Denies any skin changes. However when evaluated in urgent care earlier today, skin rash was noted and she did observe that this was new for her. Denies any new medications and indeed is quite active around her property,  has been painting a deck, fixing fence, and has been exposed to multiple takes, last exposed off approximately 2 weeks ago, denies any synovitis, has had a decrease in appetite over the last 24-48 hours. But denies any gastric irritation.   Hospital Course: Ms. Bolls was admitted to a medical bed. She empirically placed on doxycycline to cover tickborne illnesses. Multiple labs urology's were obtained including RMSF, Lyme, and Ehrlichia titers, EBV/CMV antibodies, HIV antibody, and  blood cultures. Blood cultures initially grew gram-positive cocci in clusters so vancomycin was added pending sensitivities. This is now been confirmed to be coag-negative staph.  Her RMSF IgM is pending but IgG is negative. Lyme disease is negative. Ehrlichia is pending. With these treatments she has improved significantly with resolution of her fever, myalgias, headache, and  Improved overall sense of well-being.  Her rash has faded and was never a classic RMSF rash as it did not involve the palms or soles and was primarily truncal rash.  Her liver tests continue to trend up slightly. Right quadrant ultrasound shows no acute liver process. Her pancytopenia has improved consistent with resolving infectious process. Vancomycin can be discontinued at this point but she'll continue on doxycycline for 14 days to adequately cover of tick/arthropod borne illness. With improvement in her condition she is stable for discharge home. Of note, infectious disease did evaluate the patient and concurred with management. Consider checking hepatitis serologies if LFTs continue to trend up. This can be followed as an outpatient.  Day of Discharge Exam BP 137/76  Pulse 55  Temp(Src) 98.2 F (36.8 C) (Oral)  Resp 17  Ht $R'5\' 6"'MZ$  (1.676 m)  Wt 65.772 kg (145 lb)  BMI 23.41 kg/m2  SpO2 99%  Physical Exam: General appearance: alert and no distress Eyes: no scleral icterus Throat: oropharynx moist without erythema Resp: clear to auscultation bilaterally Cardio: regular rate and rhythm GI: soft, non-tender; bowel sounds normal; no masses,  no organomegaly Extremities: no clubbing, cyanosis or edema  Skin: Fading fine lacy rash over the abdomen and lower chest with no involvement of palms or soles; possible small petechial rash over the dorsum of the left foot  Discharge Labs:  Recent Labs  08/27/13 0315 08/28/13 0521  NA 138 140  K 3.4* 3.6*  CL 103 102  CO2 23 25  GLUCOSE 109* 94  BUN 8 7  CREATININE 0.73  0.55  CALCIUM 7.9* 8.3*   Hepatic Function Latest Ref Rng 08/28/2013 08/27/2013 08/26/2013  Total Protein 6.0 - 8.3 g/dL 6.0 5.7(L) 5.9(L)  Albumin 3.5 - 5.2 g/dL 2.9(L) 2.6(L) 2.8(L)  AST 0 - 37 U/L 167(H) 138(H) 145(H)  ALT 0 - 35 U/L 140(H) 113(H) 119(H)  Alk Phosphatase 39 - 117 U/L 310(H) 255(H) 195(H)  Total Bilirubin 0.3 - 1.2 mg/dL 0.7 1.0 1.4(H)    CBC Latest Ref Rng 08/28/2013 08/27/2013 08/26/2013  WBC 4.0 - 10.5 K/uL 5.6 2.9(L) 2.1(L)  Hemoglobin 12.0 - 15.0 g/dL 12.2 11.6(L) 11.9(L)  Hematocrit 36.0 - 46.0 % 35.8(L) 33.8(L) 35.5(L)  Platelets 150 - 400 K/uL 83(L) 56(L) 58(L)   EBV IgG >750, IgM <10 CMV IgG 2.30, IgM <8.0 HIV antibody negative RMSF IgG 0.15, IgM pending B. Burgdorferi IgM, IgG negative Ehrlichia pending Blood Cultures- Coag Negative Staph X 1 (from urgent care), 6/20 and 6/21 cultures negative to date X 4 Sed Rate 5  Discharge instructions:     Discharge Instructions   Diet general    Complete by:  As directed  Discharge instructions    Complete by:  As directed   Call if fever above 101.5, increasing headache, worsening rash, or muscle aches. Push fluid intake.     Increase activity slowly    Complete by:  As directed            Disposition: to home  Follow-up Appts: Follow-up with Dr. Reynaldo Minium at [facility] in 1 week.  Condition on Discharge: Improved  Tests Needing Follow-up: RMSF IgM, Ehrlichia antibodies, final blood cultures pending; Repeat CBC and CMET at follow-up visit  Signed: Marton Redwood 08/28/2013, 7:46 AM

## 2013-08-28 NOTE — Discharge Instructions (Signed)

## 2013-08-29 LAB — ROCKY MTN SPOTTED FVR AB, IGM-BLOOD
RMSF IgM: 0.15 IV (ref 0.00–0.89)
ROCKY MTN SPOTTED FEVER, IGM: 0.17 IV

## 2013-08-29 LAB — ROCKY MTN SPOTTED FVR AB, IGG-BLOOD: RMSF IGG: 0.15 IV

## 2013-08-30 LAB — EHRLICHIA ANTIBODY PANEL: E chaffeensis (HGE) Ab, IgM: <1:20 {titer}

## 2013-09-01 LAB — CULTURE, BLOOD (ROUTINE X 2)
Culture: NO GROWTH
Culture: NO GROWTH

## 2013-09-02 LAB — CULTURE, BLOOD (ROUTINE X 2)
CULTURE: NO GROWTH
Culture: NO GROWTH

## 2013-09-19 ENCOUNTER — Other Ambulatory Visit: Payer: Self-pay | Admitting: Internal Medicine

## 2013-09-19 DIAGNOSIS — R509 Fever, unspecified: Secondary | ICD-10-CM

## 2013-09-24 ENCOUNTER — Ambulatory Visit
Admission: RE | Admit: 2013-09-24 | Discharge: 2013-09-24 | Disposition: A | Discharge status: Home / Self Care | Payer: Medicare Other | Source: Ambulatory Visit | Attending: Internal Medicine | Admitting: Internal Medicine

## 2013-09-24 DIAGNOSIS — R509 Fever, unspecified: Secondary | ICD-10-CM

## 2013-09-24 MED ORDER — IOHEXOL 300 MG/ML  SOLN
100.0000 mL | Freq: Once | INTRAMUSCULAR | Status: AC | PRN
  Administered 2013-09-24: 100 mL via INTRAVENOUS

## 2014-09-02 ENCOUNTER — Other Ambulatory Visit: Payer: Self-pay

## 2015-12-18 ENCOUNTER — Ambulatory Visit (INDEPENDENT_AMBULATORY_CARE_PROVIDER_SITE_OTHER): Payer: Self-pay | Admitting: Orthopaedic Surgery

## 2016-01-29 IMAGING — CR DG CHEST 2V
2 series · 2 of 2 positions shown · non-contrast
Comparison: 06/23/2012

CLINICAL DATA: Fever

EXAM:
CHEST  2 VIEW

[PA]
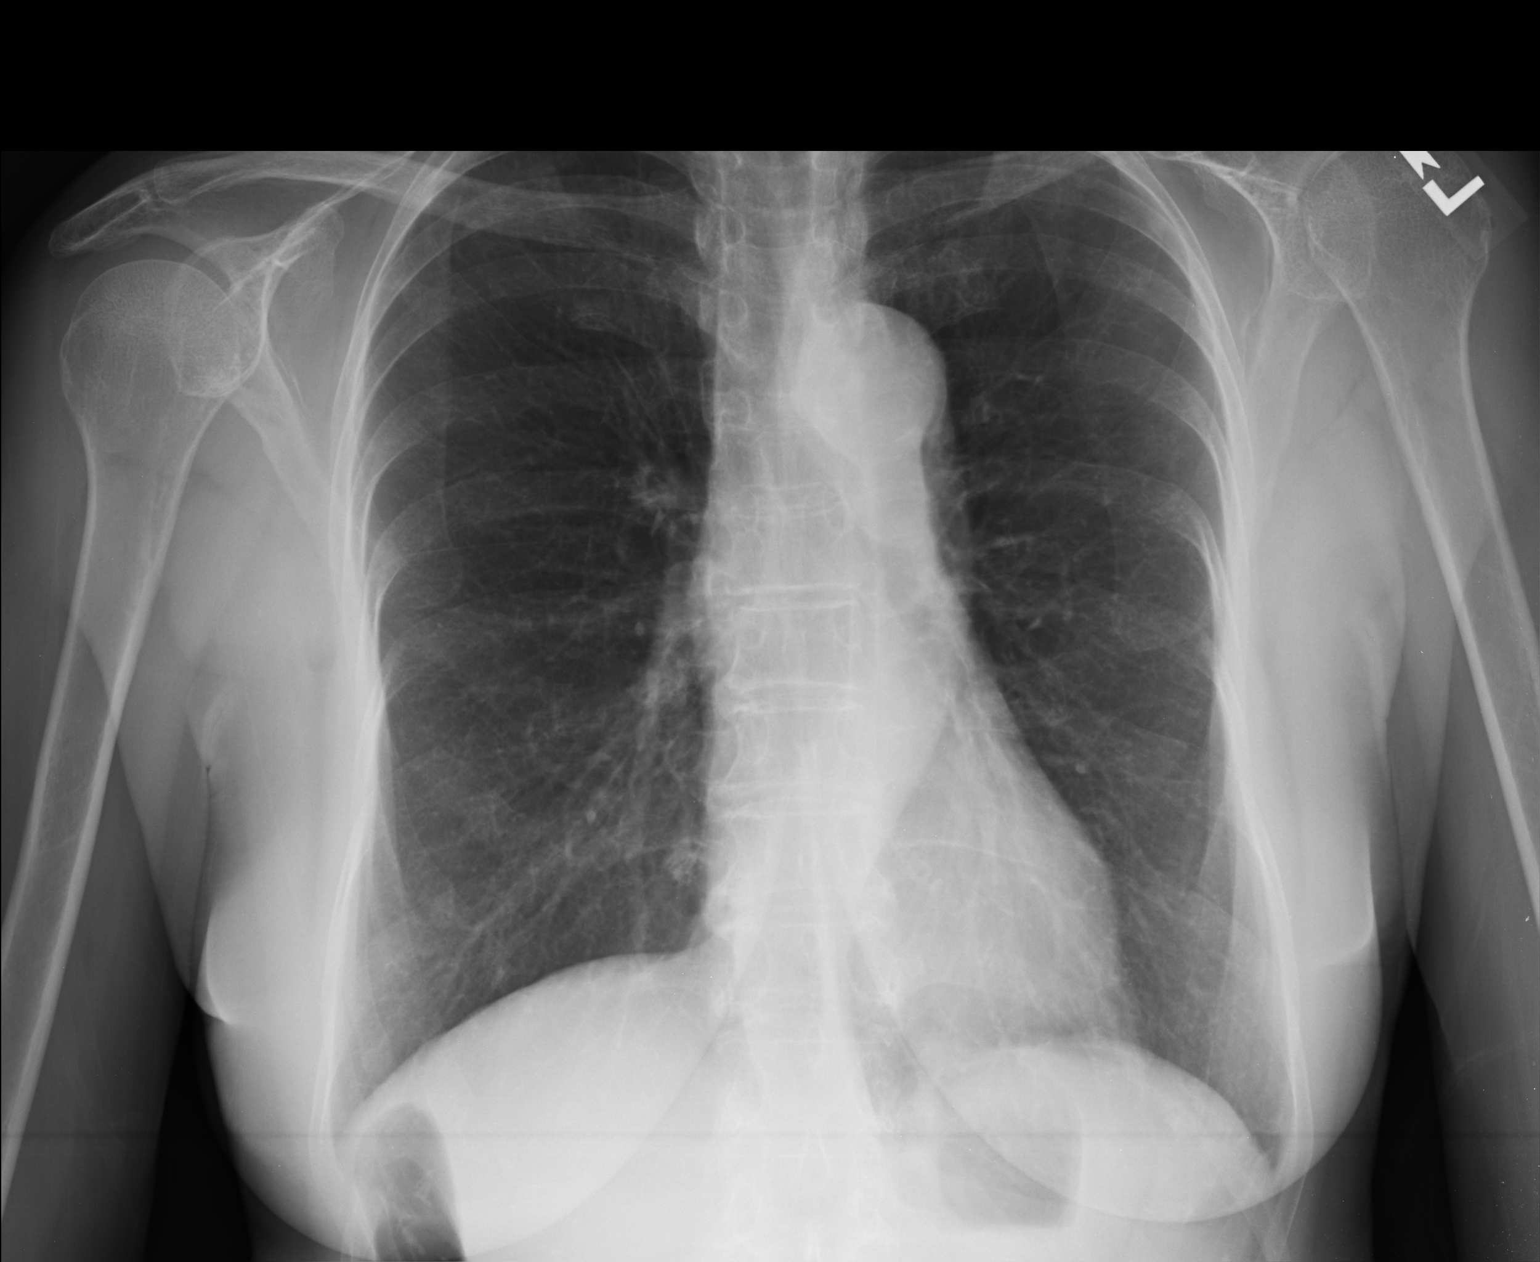

[lateral]
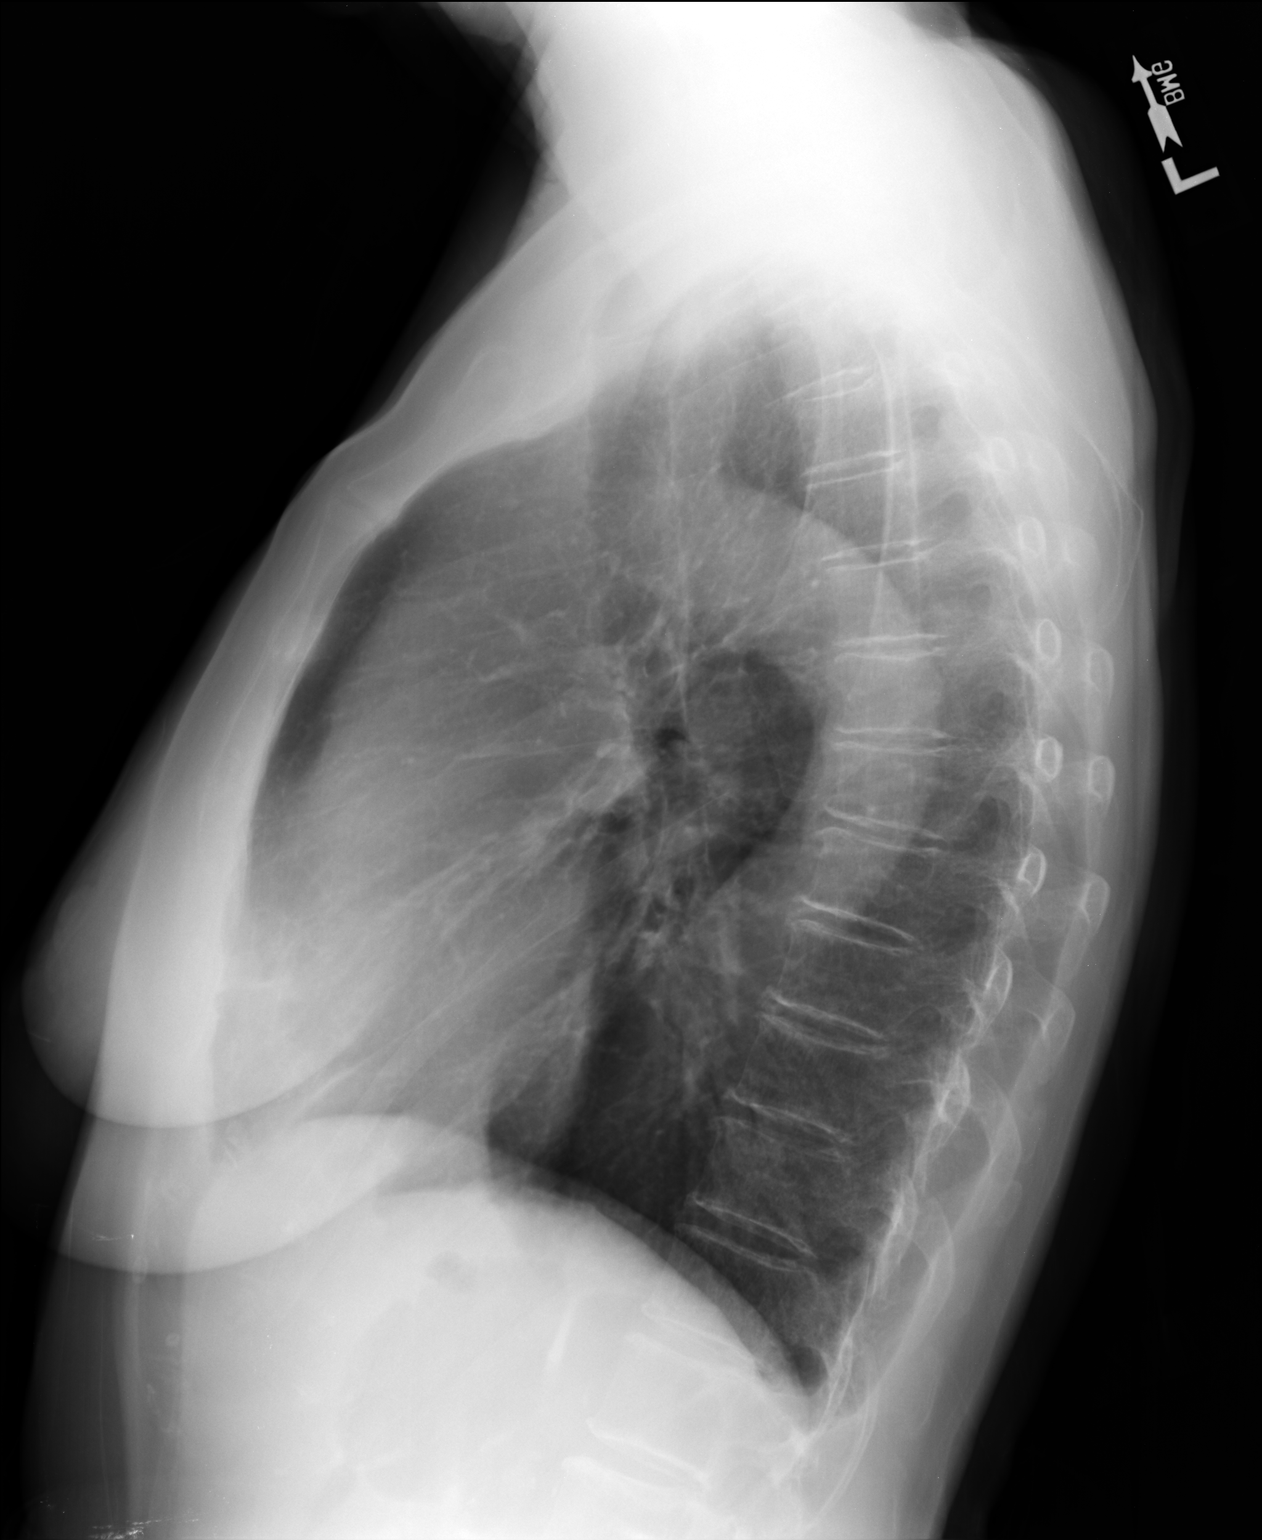

[2 of 2 positions shown; findings below may reference images not displayed]

FINDINGS: Heart size and vascular pattern are normal. Lungs are clear.
Uncoiling of the aorta is stable.
IMPRESSION: No active cardiopulmonary disease.

## 2016-01-31 IMAGING — US US ABDOMEN LIMITED
1 series · 14 of 25 positions shown · non-contrast
Comparison: None.

CLINICAL DATA: Elevated liver function tests.  Fever.

EXAM:
US ABDOMEN LIMITED - RIGHT UPPER QUADRANT

[Series 1: us abdomen limited · 0.24mm/px · 14 of 45 slices shown]
[im 1/45]
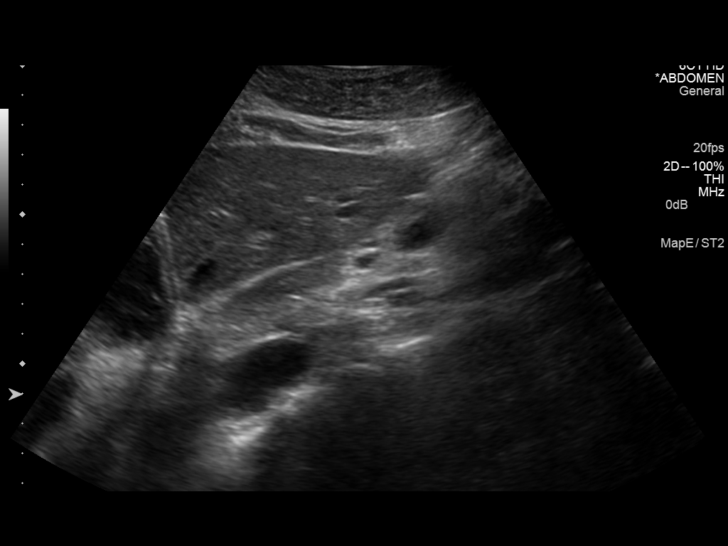
[im 4/45]
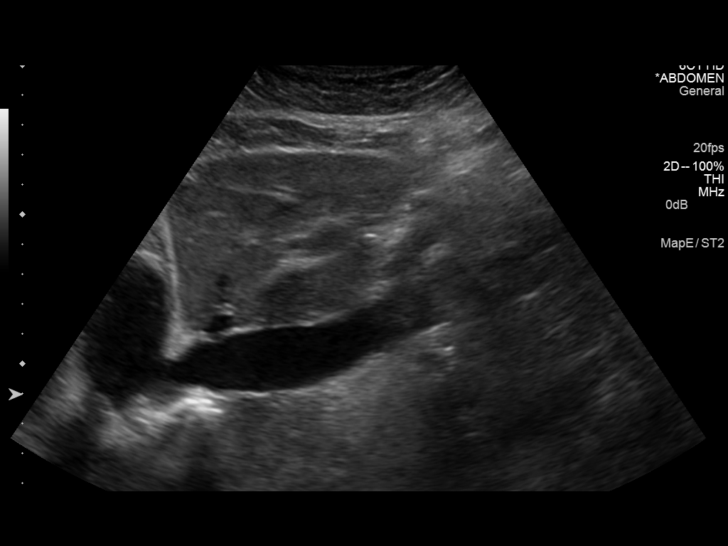
[im 8/45]
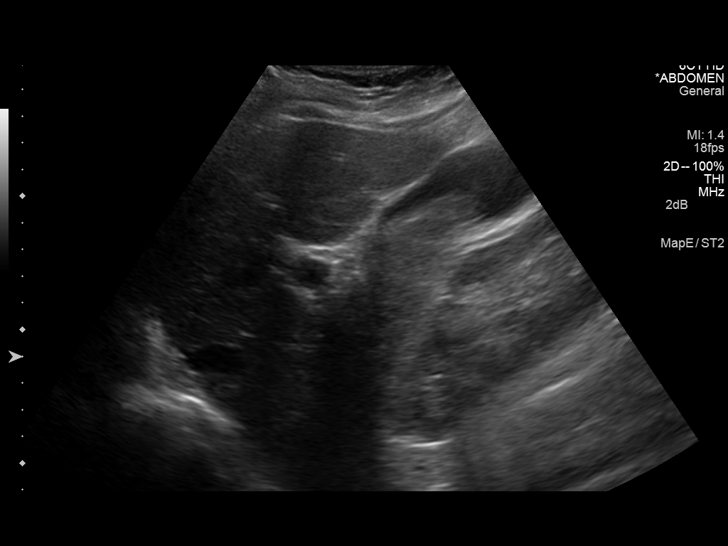
[im 12/45]
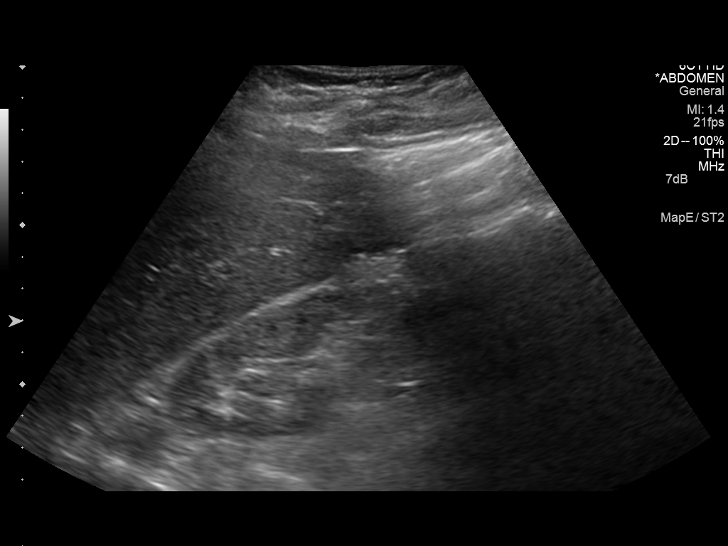
[im 15/45]
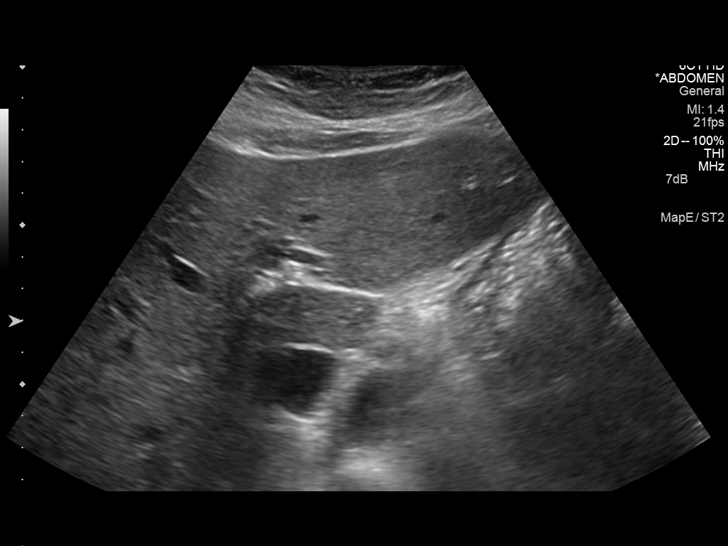
[im 17/45]
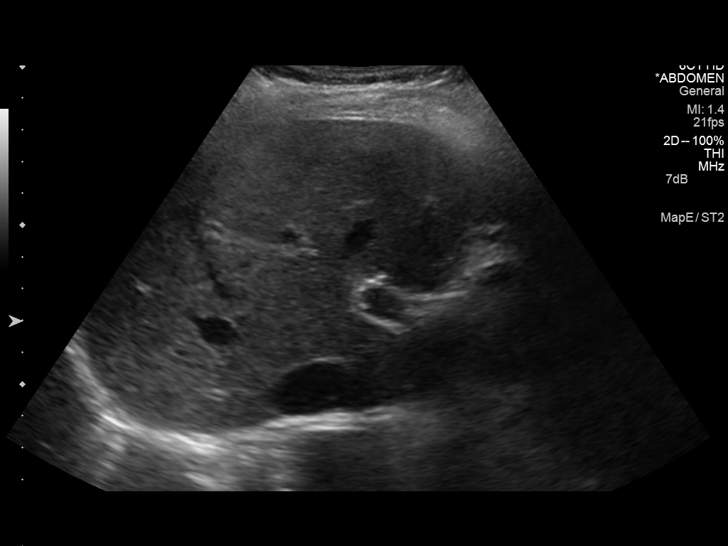
[im 21/45]
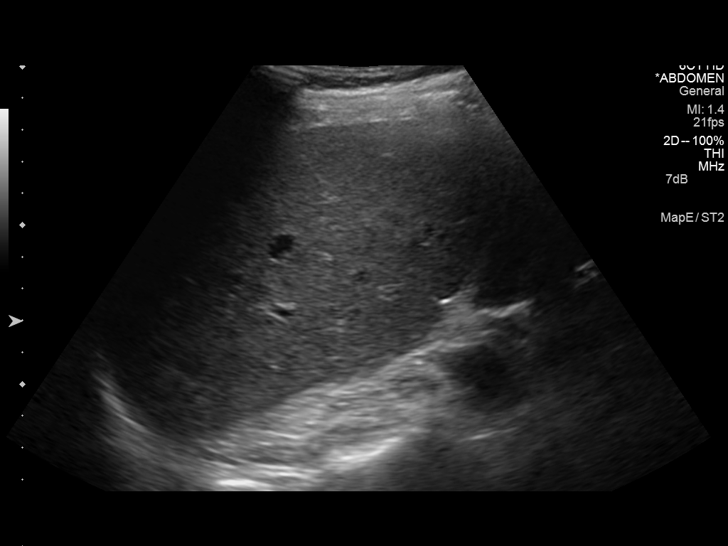
[im 24/45]
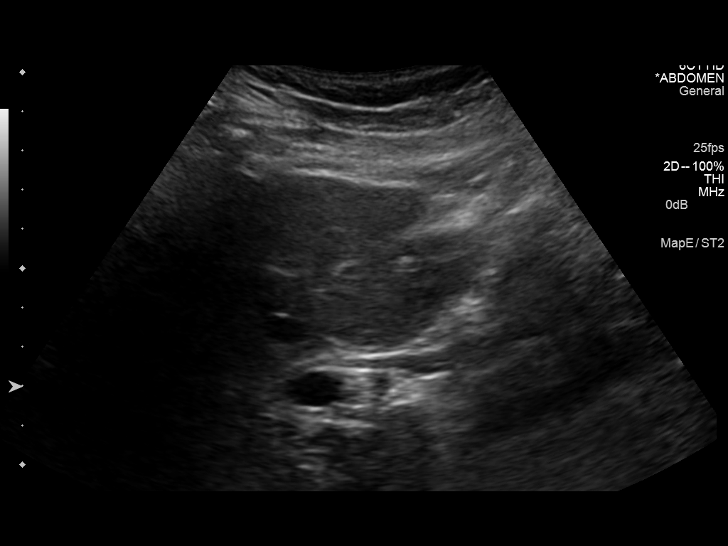
[im 28/45]
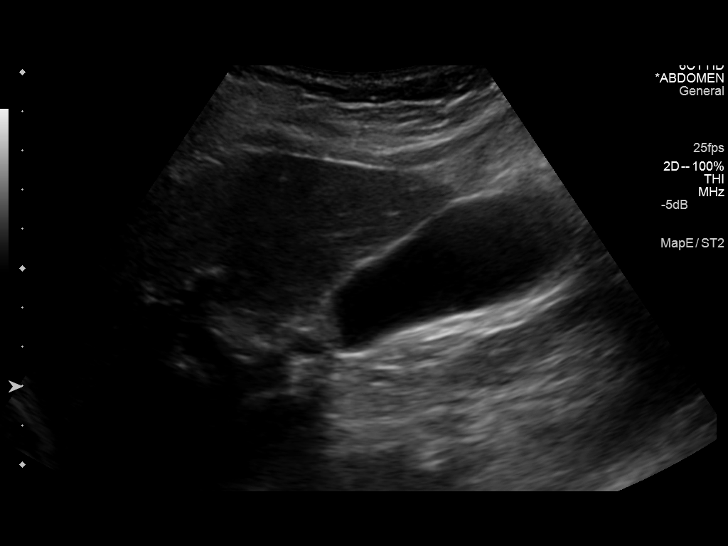
[im 30/45]
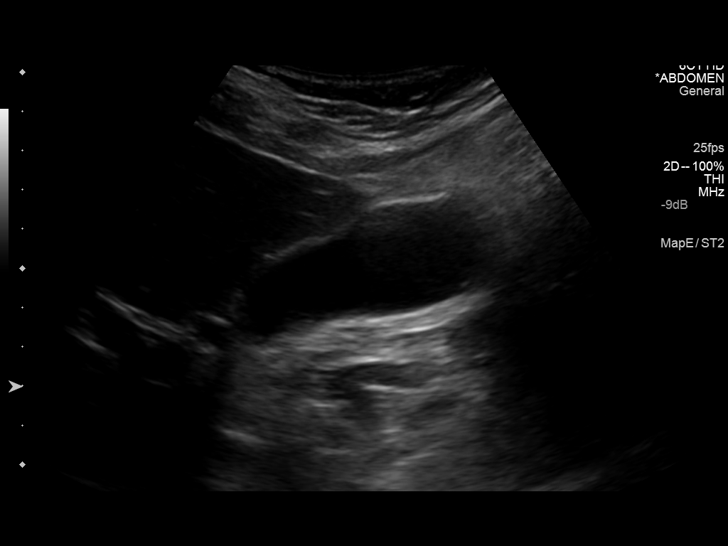
[im 34/45]
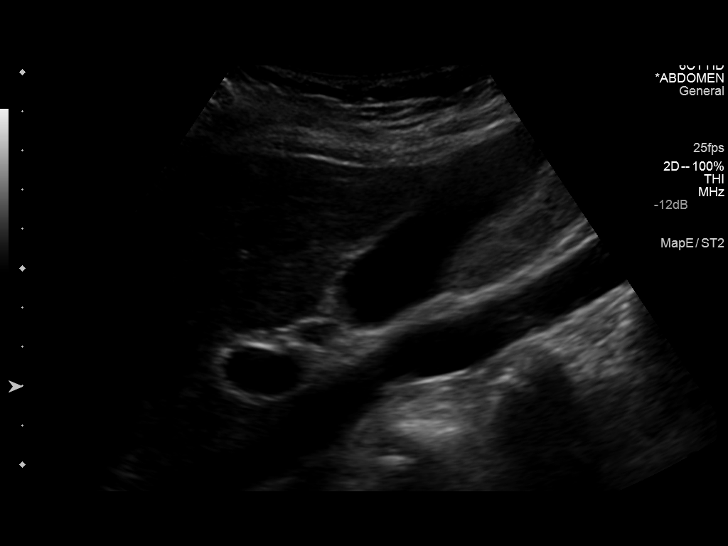
[im 37/45]
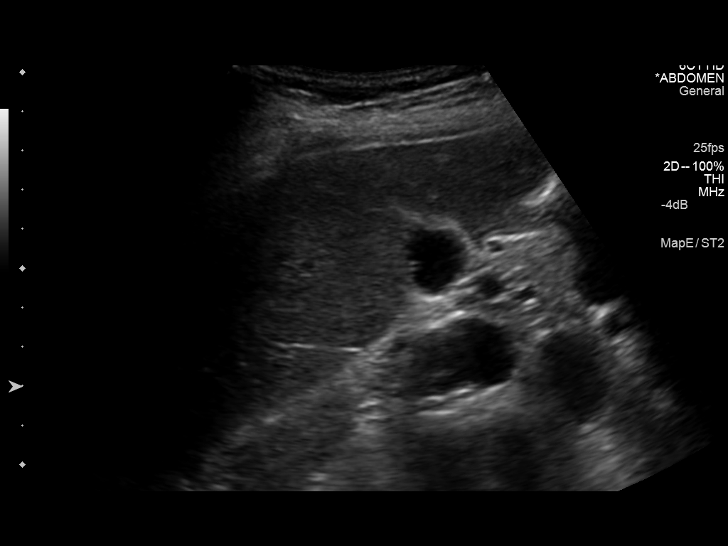
[im 41/45]
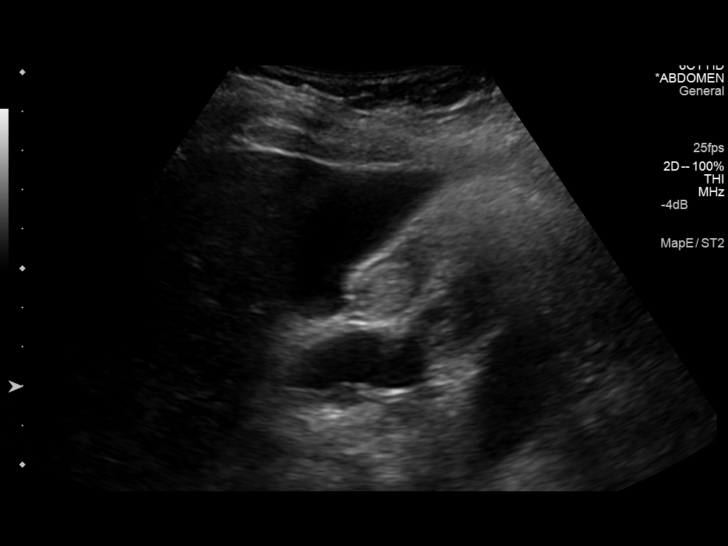
[im 45/45]
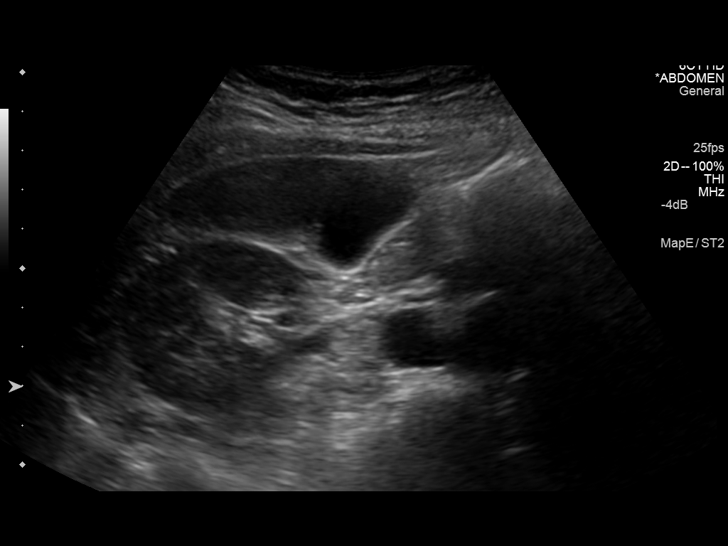

[14 of 25 positions shown; findings below may reference images not displayed]

FINDINGS: Gallbladder:

No gallstones or wall thickening visualized. No sonographic Murphy
sign noted.

Common bile duct:

Diameter: 3 mm

Liver:

No focal lesion identified. Within normal limits in parenchymal
echogenicity.
IMPRESSION: Normal right upper quadrant ultrasound.

## 2016-08-11 ENCOUNTER — Other Ambulatory Visit (INDEPENDENT_AMBULATORY_CARE_PROVIDER_SITE_OTHER): Payer: Self-pay

## 2016-08-11 ENCOUNTER — Ambulatory Visit (INDEPENDENT_AMBULATORY_CARE_PROVIDER_SITE_OTHER): Payer: Medicare Other | Admitting: Orthopaedic Surgery

## 2016-08-11 DIAGNOSIS — M25551 Pain in right hip: Secondary | ICD-10-CM

## 2016-08-11 NOTE — Progress Notes (Signed)
The patient is a 76 year old left seen for right hip before. We had x-rays back in October that shoulder normal-appearing hip. 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 She feels like she has a fall risk and that balance can be issue in the hip just "gives out on her".  On examination I can fluidly and easily move her right hip through full range of motion with no blocks to rotation of the hip and no significant pain at all around her hip socket or hip in general. Her leg feels strong and there is no significant pain on compressing the hip joint.  I would like to send her though to physical therapy as an outpatient to work on strengthening of muscle around the hip in general and any other modalities to decrease her pain. I will see her back in 4 weeks to see if therapy is helping. If not we may consider an MRI of her right hip or even an intra-articular injection.

## 2016-08-12 ENCOUNTER — Other Ambulatory Visit (INDEPENDENT_AMBULATORY_CARE_PROVIDER_SITE_OTHER): Payer: Self-pay

## 2016-08-24 ENCOUNTER — Ambulatory Visit: Payer: Medicare Other | Attending: Internal Medicine | Admitting: Physical Therapy

## 2016-08-24 ENCOUNTER — Encounter: Payer: Self-pay | Admitting: Physical Therapy

## 2016-08-24 DIAGNOSIS — M25651 Stiffness of right hip, not elsewhere classified: Secondary | ICD-10-CM | POA: Diagnosis present

## 2016-08-24 DIAGNOSIS — M25551 Pain in right hip: Secondary | ICD-10-CM | POA: Diagnosis present

## 2016-08-24 NOTE — Therapy (Signed)
[facility] [address] [place], [place], [address] Phone: [phone]   Fax:  [phone]  Physical Therapy Evaluation  Patient Details  Name: Katherine StandardGail Atkins MRN: [id number] Date of Birth: 11-29-1940 Referring Provider: Kathryne HitchBlackman, Christopher Y, MD  Encounter Date: 08/24/2016      PT End of Session - 08/24/16 1153    Visit Number 1   Number of Visits 9   Date for PT Re-Evaluation 09/24/16   Authorization Type UHC MCR   PT Start Time 1150   PT Stop Time 1232   PT Time Calculation (min) 42 min   Activity Tolerance Patient tolerated treatment well   Behavior [facility] for tasks assessed/performed      Past Medical History:  Diagnosis Date  . Blood dyscrasia   . Cataract   . Fever 08/27/2013  . Heart murmur   . Osteoporosis     Past Surgical History:  Procedure Laterality Date  . CATARACT EXTRACTION  2013  . EYE SURGERY      There were no vitals filed for this visit.       Subjective Assessment - 08/24/16 1154    Subjective 1964 broke L leg skiing. 3-4 years ago decided she should use of L leg more, realized one day that she was using her arms to lift R leg. Feels her R hip is weak and tight. Some friends who are PTs gave her exercises which were helpful. R leg gives out on her for no reason, randomly. REports MD diagnosed stretched tendon. Pain is exruciating, sharp and dehabilitating pain. When the pain comes on, she does not feel that she can rub the pain, it is deep in the joint. Knows L leg is 3/4 of 1 inch shorter   Patient Stated Goals ride her horse, decrease pain, reduce giving out   Currently in Pain? Yes   Pain Score --  low   Pain Location Hip   Pain Orientation Right   Pain Descriptors / Indicators Sharp   Aggravating Factors  unknown   Pain Relieving Factors just wait until it goes away            [facility] - 08/24/16 0001      Assessment   Medical Diagnosis R hip pain    Referring Provider Kathryne HitchBlackman, Christopher Y, MD   Hand Dominance Right   Next MD Visit 7/6   Prior Therapy no     Precautions   Precautions None     Restrictions   Weight Bearing Restrictions No     Balance Screen   Has the patient fallen in the past 6 months No     Home Environment   Living Environment Private residence   Additional Comments no stairs at home     Prior Function   Level of Independence Independent     Cognition   Overall Cognitive Status Within Functional Limits for tasks assessed     Observation/Other Assessments   Focus on Therapeutic Outcomes (FOTO)  40% limited (goal 29%     Sensation   Additional [facility]     Posture/Postural Control   Posture Comments hips anteriorly placed "hanging off the Ys"     ROM / Strength   AROM / PROM / Strength PROM;Strength     PROM   PROM Assessment Site Hip   Right/Left Hip Right;Left   Right Hip External Rotation  34   Left Hip External Rotation  50     Strength  Overall Strength Comments pain with R hip flexion     Palpation   Palpation comment TTP R TFL, great troch bursa; concordant pain in distal illiopsoas     Transfers   Comments valgus collapse in sit<>stand            Objective measurements completed on examination: See above findings.          [facility] Adult PT Treatment/Exercise - 08/24/16 0001      Exercises   Exercises Knee/Hip     Knee/Hip Exercises: Stretches   Other Knee/Hip Stretches thomas test stretch     Knee/Hip Exercises: Standing   Other Standing Knee Exercises glut sets                PT Education - 08/24/16 1257    Education provided Yes   Education Details anatomy of condition, POC, HEP, exercise form/rationale   Person(s) Educated Patient   Methods Explanation;Demonstration;Tactile cues;Verbal cues   Comprehension Verbalized understanding;Returned demonstration;Verbal cues required;Tactile cues required;Need further instruction              PT Long Term Goals - 08/24/16 1302      PT LONG TERM GOAL #1   Title FOTO to 29% limitation to indicate significant improvement in functional ability by 7/20   Baseline 40% limitation at eval   Time 4   Period Weeks   Status New     PT LONG TERM GOAL #2   Title Pt will be able to ride her horse without limitation by hip pain to return to PLOF   Baseline not riding due to fear of falling at eval   Time 4   Period Weeks   Status New     PT LONG TERM GOAL #3   Title Pt will verbalize resolution of hip "giving out" to decrease fall risk   Baseline so far has been lucky and had something to grab on to when her hip has given out   Time 4   Period Weeks   Status New     PT LONG TERM GOAL #4   Title Hip MMT 5/5 to indicate proper support from surrounding musculature to decrease demand on illiopsoas   Baseline N/T at eval due to pain   Time 4   Period Weeks   Status New                Plan - 08/24/16 1257    Clinical Impression Statement Pt presents to PT with complaints of R anterior hip pain that is chronic. LLD that was reported by pt is not notable. No pain with scour, limited hip ER PROM on R. R innom post rotation corrected with gentle long axis RLE traction. Concordant pain found in trigger point of R distal illiopsoas. Pt verbalized feeling good and that we found the right spot. Pt will benefit from skilled PT in order to decrease demand on illiopsoas and improve biomechanical chain activation and decrease hip pain.    History and Personal Factors relevant to plan of care: none   Clinical Presentation Stable   Clinical Presentation due to: n/a   Clinical Decision Making Low   Rehab Potential Good   PT Frequency 2x / week   PT Duration 4 weeks   PT Treatment/Interventions ADLs/Self Care Home Management;Cryotherapy;Electrical Stimulation;Iontophoresis 4mg /ml Dexamethasone;Functional mobility training;Stair training;Gait training;Ultrasound;Traction;Moist  Heat;Therapeutic activities;Therapeutic exercise;Balance training;Neuromuscular re-education;Patient/family education;Passive range of motion;Manual techniques;Dry needling;Taping   PT Next Visit Plan abdominal activation, illiopsoas release prn   PT  Home Exercise Plan thomas test stretch, glut sets   Consulted and Agree with Plan of Care Patient      Patient will benefit from skilled therapeutic intervention in order to improve the following deficits and impairments:  Decreased range of motion, Increased muscle spasms, Decreased activity tolerance, Pain, Impaired flexibility, Decreased strength, Postural dysfunction  Visit Diagnosis: Pain in right hip - Plan: PT plan of care cert/re-cert  Stiffness of right hip, not elsewhere classified - Plan: PT plan of care cert/re-cert      G-Codes - 08-25-2016 1305    Functional Assessment Tool Used (Outpatient Only) FOTO 40% limited, clinical judgement   Functional Limitation Mobility: Walking and moving around   Mobility: Walking and Moving Around Current Status (Z6109) At least 40 percent but less than 60 percent impaired, limited or restricted   Mobility: Walking and Moving Around Goal Status [phone]) At least 20 percent but less than 40 percent impaired, limited or restricted       Problem List Patient Active Problem List   Diagnosis Date Noted  . Rash and nonspecific skin eruption 08/28/2013  . Fever, unspecified 08/27/2013  . Pancytopenia (HCC) 08/27/2013  . Elevated liver function tests 08/27/2013  . Leukopenia 08/25/2013  . HEMORRHOIDS, INTERNAL 01/05/2010  . FECAL INCONTINENCE 01/05/2010    Katherine Atkins C. Ki Luckman PT, DPT August 25, 2016 1:10 PM   [facility] [address] [place], [place], [address] Phone: [phone]   Fax:  [phone]  Name: Katherine Atkins MRN: [id number] Date of Birth: 07/17/1940

## 2016-08-24 NOTE — Therapy (Deleted)
[facility] [address] [place], [place], [address] Phone: [phone]   Fax:  [phone]  Physical Therapy Treatment  Patient Details  Name: Katherine Atkins MRN: [id number] Date of Birth: [birth year] Referring Provider: Kathryne Hitch, MD  Encounter Date: 08/24/2016      PT End of Session - 08/24/16 1153    Visit Number 1   Number of Visits 9   Date for PT Re-Evaluation 09/24/16   Authorization Type UHC MCR   PT Start Time 1150   PT Stop Time 1232   PT Time Calculation (min) 42 min   Activity Tolerance Patient tolerated treatment well   Behavior During Therapy [facility] for tasks assessed/performed      Past Medical History:  Diagnosis Date  . Blood dyscrasia   . Cataract   . Fever 08/27/2013  . Heart murmur   . Osteoporosis     Past Surgical History:  Procedure Laterality Date  . CATARACT EXTRACTION  2013  . EYE SURGERY      There were no vitals filed for this visit.      Subjective Assessment - 08/24/16 1154    Subjective 1964 broke L leg skiing. 3-4 years ago decided she should use of L leg more, realized one day that she was using her arms to lift R leg. Feels her R hip is weak and tight. Some friends who are PTs gave her exercises which were helpful. R leg gives out on her for no reason, randomly. REports MD diagnosed stretched tendon. Pain is exruciating, sharp and dehabilitating pain. When the pain comes on, she does not feel that she can rub the pain, it is deep in the joint. Knows L leg is 3/4 of 1 inch shorter   Patient Stated Goals ride her horse, decrease pain, reduce giving out   Currently in Pain? Yes   Pain Score --  low   Pain Location Hip   Pain Orientation Right   Pain Descriptors / Indicators Sharp   Aggravating Factors  unknown   Pain Relieving Factors just wait until it goes away            [facility] PT Assessment - 08/24/16 0001      Assessment   Medical Diagnosis R hip pain    Referring Provider Kathryne Hitch, MD   Hand Dominance Right   Next MD Visit 7/6   Prior Therapy no     Precautions   Precautions None     Restrictions   Weight Bearing Restrictions No     Balance Screen   Has the patient fallen in the past 6 months No     Home Environment   Living Environment Private residence   Additional Comments no stairs at home     Prior Function   Level of Independence Independent     Cognition   Overall Cognitive Status Within Functional Limits for tasks assessed     Observation/Other Assessments   Focus on Therapeutic Outcomes (FOTO)  40% limited (goal 29%     Sensation   Additional Comments [facility]     Posture/Postural Control   Posture Comments hips anteriorly placed "hanging off the Ys"     ROM / Strength   AROM / PROM / Strength PROM;Strength     PROM   PROM Assessment Site Hip   Right/Left Hip Right;Left   Right Hip External Rotation  34   Left Hip External Rotation  50     Strength  Overall Strength Comments pain with R hip flexion     Palpation   Palpation comment TTP R TFL, great troch bursa; concordant pain in distal illiopsoas     Transfers   Comments valgus collapse in sit<>stand                     [facility] Adult PT Treatment/Exercise - 08/24/16 0001      Exercises   Exercises Knee/Hip     Knee/Hip Exercises: Stretches   Other Knee/Hip Stretches thomas test stretch     Knee/Hip Exercises: Standing   Other Standing Knee Exercises glut sets                PT Education - 08/24/16 1257    Education provided Yes   Education Details anatomy of condition, POC, HEP, exercise form/rationale   Person(s) Educated Patient   Methods Explanation;Demonstration;Tactile cues;Verbal cues   Comprehension Verbalized understanding;Returned demonstration;Verbal cues required;Tactile cues required;Need further instruction             PT Long Term Goals - 08/24/16 1302      PT LONG TERM GOAL #1    Title FOTO to 29% limitation to indicate significant improvement in functional ability by 7/20   Baseline 40% limitation at eval   Time 4   Period Weeks   Status New     PT LONG TERM GOAL #2   Title Pt will be able to ride her horse without limitation by hip pain to return to PLOF   Baseline not riding due to fear of falling at eval   Time 4   Period Weeks   Status New     PT LONG TERM GOAL #3   Title Pt will verbalize resolution of hip "giving out" to decrease fall risk   Baseline so far has been lucky and had something to grab on to when her hip has given out   Time 4   Period Weeks   Status New     PT LONG TERM GOAL #4   Title Hip MMT 5/5 to indicate proper support from surrounding musculature to decrease demand on illiopsoas   Baseline N/T at eval due to pain   Time 4   Period Weeks   Status New               Plan - 08/24/16 1257    Clinical Impression Statement Pt presents to PT with complaints of R anterior hip pain that is chronic. LLD that was reported by pt is not notable. No pain with scour, limited hip ER PROM on R. R innom post rotation corrected with gentle long axis RLE traction. Concordant pain found in trigger point of R distal illiopsoas. Pt verbalized feeling good and that we found the right spot. Pt will benefit from skilled PT in order to decrease demand on illiopsoas and improve biomechanical chain activation and decrease hip pain.    History and Personal Factors relevant to plan of care: none   Clinical Presentation Stable   Clinical Presentation due to: n/a   Clinical Decision Making Low   Rehab Potential Good   PT Frequency 2x / week   PT Duration 4 weeks   PT Treatment/Interventions ADLs/Self Care Home Management;Cryotherapy;Electrical Stimulation;Iontophoresis 4mg /ml Dexamethasone;Functional mobility training;Stair training;Gait training;Ultrasound;Traction;Moist Heat;Therapeutic activities;Therapeutic exercise;Balance training;Neuromuscular  re-education;Patient/family education;Passive range of motion;Manual techniques;Dry needling;Taping   PT Next Visit Plan abdominal activation, illiopsoas release prn   PT Home Exercise Plan thomas test stretch, glut sets  Consulted and Agree with Plan of Care Patient      Patient will benefit from skilled therapeutic intervention in order to improve the following deficits and impairments:  Decreased range of motion, Increased muscle spasms, Decreased activity tolerance, Pain, Impaired flexibility, Decreased strength, Postural dysfunction  Visit Diagnosis: Pain in right hip - Plan: PT plan of care cert/re-cert  Stiffness of right hip, not elsewhere classified - Plan: PT plan of care cert/re-cert       G-Codes - 08/24/16 1305    Functional Assessment Tool Used (Outpatient Only) FOTO 40% limited, clinical judgement   Functional Limitation Mobility: Walking and moving around   Mobility: Walking and Moving Around Current Status ([id number]) At least 40 percent but less than 60 percent impaired, limited or restricted   Mobility: Walking and Moving Around Goal Status [phone]([id number]) At least 20 percent but less than 40 percent impaired, limited or restricted      Problem List Patient Active Problem List   Diagnosis Date Noted  . Rash and nonspecific skin eruption 08/28/2013  . Fever, unspecified 08/27/2013  . Pancytopenia (HCC) 08/27/2013  . Elevated liver function tests 08/27/2013  . Leukopenia 08/25/2013  . HEMORRHOIDS, INTERNAL 01/05/2010  . FECAL INCONTINENCE 01/05/2010    Wang [place]ada C. Sindi Beckworth PT, DPT 08/24/16 1:10 PM   [facility] [address] [place], [place], [address] Phone: [phone]   Fax:  [phone]  Name: Katherine Atkins MRN: [id number] Date of Birth: 07/06/1940

## 2016-08-26 ENCOUNTER — Ambulatory Visit: Payer: Medicare Other | Admitting: Physical Therapy

## 2016-08-26 DIAGNOSIS — M25651 Stiffness of right hip, not elsewhere classified: Secondary | ICD-10-CM

## 2016-08-26 DIAGNOSIS — M25551 Pain in right hip: Secondary | ICD-10-CM

## 2016-08-26 NOTE — Therapy (Signed)
[facility] [address] [place], [place], [address] Phone: [phone]   Fax:  [phone]  Physical Therapy Treatment  Patient Details  Name: Katherine StandardGail Umstead MRN: [id number] Date of Birth: September 22, 1940 Referring Provider: Kathryne HitchBlackman, Christopher Y, MD  Encounter Date: 08/26/2016      PT End of Session - 08/26/16 1213    Visit Number 2   Number of Visits 9   Date for PT Re-Evaluation 09/24/16   Authorization Type UHC MCR   PT Start Time 1146   PT Stop Time 1225   PT Time Calculation (min) 39 min      Past Medical History:  Diagnosis Date  . Blood dyscrasia   . Cataract   . Fever 08/27/2013  . Heart murmur   . Osteoporosis     Past Surgical History:  Procedure Laterality Date  . CATARACT EXTRACTION  2013  . EYE SURGERY      There were no vitals filed for this visit.      Subjective Assessment - 08/26/16 1149    Subjective I never use these muscles.    Currently in Pain? No/denies                         [facility] Adult PT Treatment/Exercise - 08/26/16 0001      Lumbar Exercises: Supine   Ab Set 5 reps   AB Set Limitations Transverse abdominal draw in with gentle pelvic floor contraction   Glut Set 10 reps   Clam 10 reps   Clam Limitations unilateral, cues for breathing, core    Bent Knee Raise 10 reps   Bent Knee Raise Limitations cues for breathing, neutral, core      Knee/Hip Exercises: Stretches   Other Knee/Hip Stretches thomas test stretch  3 x30      Knee/Hip Exercises: Standing   Other Standing Knee Exercises --   Other Standing Knee Exercises using wall during pelvic tilt for reference. x 10      Knee/Hip Exercises: Seated   Other Seated Knee/Hip Exercises seated pelvic tilt, engaging core                 PT Education - 08/26/16 1236    Education provided Yes   Education Details HEP   Person(s) Educated Patient   Methods Explanation;Handout   Comprehension Verbalized  understanding             PT Long Term Goals - 08/24/16 1302      PT LONG TERM GOAL #1   Title FOTO to 29% limitation to indicate significant improvement in functional ability by 7/20   Baseline 40% limitation at eval   Time 4   Period Weeks   Status New     PT LONG TERM GOAL #2   Title Pt will be able to ride her horse without limitation by hip pain to return to PLOF   Baseline not riding due to fear of falling at eval   Time 4   Period Weeks   Status New     PT LONG TERM GOAL #3   Title Pt will verbalize resolution of hip "giving out" to decrease fall risk   Baseline so far has been lucky and had something to grab on to when her hip has given out   Time 4   Period Weeks   Status New     PT LONG TERM GOAL #4   Title Hip MMT 5/5 to indicate  proper support from surrounding musculature to decrease demand on illiopsoas   Baseline N/T at eval due to pain   Time 4   Period Weeks   Status New               Plan - 08/26/16 1236    Clinical Impression Statement Pt reports compliance with initial HEP. Began abdominal and pelvic floor engagement exercises supine, sitting, and standing. Pt reports this is all new to her and she does not use these muscles. HEP updated.    PT Next Visit Plan abdominal activation, illiopsoas release prn, add figure 4    PT Home Exercise Plan thomas test stretch, glut sets, pelvic mobility (anterior and posterior pelvic tilts, neutral spine abdominal and pelvic floor engagement with unilateral clams and bent knee raises.    Consulted and Agree with Plan of Care Patient      Patient will benefit from skilled therapeutic intervention in order to improve the following deficits and impairments:  Decreased range of motion, Increased muscle spasms, Decreased activity tolerance, Pain, Impaired flexibility, Decreased strength, Postural dysfunction  Visit Diagnosis: Pain in right hip  Stiffness of right hip, not elsewhere  classified     Problem List Patient Active Problem List   Diagnosis Date Noted  . Rash and nonspecific skin eruption 08/28/2013  . Fever, unspecified 08/27/2013  . Pancytopenia (HCC) 08/27/2013  . Elevated liver function tests 08/27/2013  . Leukopenia 08/25/2013  . HEMORRHOIDS, INTERNAL 01/05/2010  . FECAL INCONTINENCE 01/05/2010    Sherrie Mustache, PTA 08/26/2016, 1:41 PM  [facility] [address] [place], [place], [address] Phone: [phone]   Fax:  [phone]  Name: Shallen Luedke MRN: [id number] Date of Birth: [birth year]

## 2016-08-26 NOTE — Patient Instructions (Addendum)
Anterior Pelvic Tilt (Supine)    Lie on back with knees bent. Arch back and Hold __5__ seconds.       Exhale, Flatten back by tightening stomach muscles and buttocks. Repeat _10___ times per set. Do ___1_ sets per session. Do __2sessions per day.

## 2016-08-31 ENCOUNTER — Ambulatory Visit: Payer: Medicare Other | Admitting: Physical Therapy

## 2016-08-31 DIAGNOSIS — M25551 Pain in right hip: Secondary | ICD-10-CM | POA: Diagnosis not present

## 2016-08-31 DIAGNOSIS — M25651 Stiffness of right hip, not elsewhere classified: Secondary | ICD-10-CM

## 2016-08-31 NOTE — Therapy (Signed)
[facility] [address] [place], [place], [address] Phone: [phone]   Fax:  [phone]  Physical Therapy Treatment  Patient Details  Name: Katherine Atkins MRN: [id number] Date of Birth: December 17, 1940 Referring Provider: Kathryne Hitch, MD  Encounter Date: 08/31/2016      PT End of Session - 08/31/16 1105    Visit Number 3   Number of Visits 9   Date for PT Re-Evaluation 09/24/16   Authorization Type UHC MCR   PT Start Time 1100   PT Stop Time 1140   PT Time Calculation (min) 40 min   Activity Tolerance Patient tolerated treatment well   Behavior During Therapy [facility] for tasks assessed/performed      Past Medical History:  Diagnosis Date  . Blood dyscrasia   . Cataract   . Fever 08/27/2013  . Heart murmur   . Osteoporosis     Past Surgical History:  Procedure Laterality Date  . CATARACT EXTRACTION  2013  . EYE SURGERY      There were no vitals filed for this visit.      Subjective Assessment - 08/31/16 1108    Subjective [place] and may have aggravated my pain in front and side of hip. One episode of sharp pain and had to stop pulling limbs.    Currently in Pain? Yes   Pain Score 1    Pain Location Buttocks  hip   Pain Orientation Right   Aggravating Factors  pulling limbs    Pain Relieving Factors use hand to support hip                          [facility] Adult PT Treatment/Exercise - 08/31/16 0001      Lumbar Exercises: Supine   Clam 10 reps   Clam Limitations unilateral, cues for breathing, core    Bent Knee Raise 10 reps   Bent Knee Raise Limitations cues for breathing, neutral, core      Knee/Hip Exercises: Stretches   Piriformis Stretch Limitations figure 4 push 3 x 30 sec    Other Knee/Hip Stretches edge of mat hip flexor stretch with overpressure      Knee/Hip Exercises: Supine   Bridges Limitations shoulder bridge x 10      Manual Therapy   Manual Therapy Joint mobilization;Passive ROM;Manual Traction   Joint Mobilization A/P mobs grade 3 to right hip   Passive ROM Right hip flexion, ER    Manual Traction long axis distraction to right hip                 PT Education - 08/31/16 1239    Education provided Yes   Education Details HEP   Person(s) Educated Patient   Methods Explanation;Handout   Comprehension Verbalized understanding             PT Long Term Goals - 08/24/16 1302      PT LONG TERM GOAL #1   Title FOTO to 29% limitation to indicate significant improvement in functional ability by 7/20   Baseline 40% limitation at eval   Time 4   Period Weeks   Status New     PT LONG TERM GOAL #2   Title Pt will be able to ride her horse without limitation by hip pain to return to PLOF   Baseline not riding due to fear of falling at eval   Time 4   Period  Weeks   Status New     PT LONG TERM GOAL #3   Title Pt will verbalize resolution of hip "giving out" to decrease fall risk   Baseline so far has been lucky and had something to grab on to when her hip has given out   Time 4   Period Weeks   Status New     PT LONG TERM GOAL #4   Title Hip MMT 5/5 to indicate proper support from surrounding musculature to decrease demand on illiopsoas   Baseline N/T at eval due to pain   Time 4   Period Weeks   Status New               Plan - 08/31/16 1243    Clinical Impression Statement Pt reports she was feeling stronger and was able to put weight on right leg and turn without pain so she thought she was ready to pull/pick up limbs without difficulty. She experienced sharp, short episode of pain in right hip posterior around to anterior hip. She was unable to do her exercises after ward and into the next morning. She is better now. Performed illiopsoas release with legs elevated. Began Bridging and modified figure 4 stretch. Updated HEP. She reports no pain increase during treatment. Encouraged her to  not pick up or pull limbs.    PT Next Visit Plan abdominal activation, illiopsoas release prn   PT Home Exercise Plan thomas test stretch, glut sets, pelvic mobility (anterior and posterior pelvic tilts, neutral spine abdominal and pelvic floor engagement with unilateral clams and bent knee raises, figure 4 push, bridge    Consulted and Agree with Plan of Care Patient      Patient will benefit from skilled therapeutic intervention in order to improve the following deficits and impairments:  Decreased range of motion, Increased muscle spasms, Decreased activity tolerance, Pain, Impaired flexibility, Decreased strength, Postural dysfunction  Visit Diagnosis: Pain in right hip  Stiffness of right hip, not elsewhere classified     Problem List Patient Active Problem List   Diagnosis Date Noted  . Rash and nonspecific skin eruption 08/28/2013  . Fever, unspecified 08/27/2013  . Pancytopenia (HCC) 08/27/2013  . Elevated liver function tests 08/27/2013  . Leukopenia 08/25/2013  . HEMORRHOIDS, INTERNAL 01/05/2010  . FECAL INCONTINENCE 01/05/2010    Sherrie Mustacheonoho, Torell Minder McGee, PTA 08/31/2016, 12:50 PM  [facility] [address] [place], [place], [address] Phone: [phone]   Fax:  [phone]  Name: Jaci StandardGail Killman MRN: [id number] Date of Birth: [birth year]

## 2016-09-02 ENCOUNTER — Ambulatory Visit: Payer: Medicare Other | Admitting: Physical Therapy

## 2016-09-02 DIAGNOSIS — M25551 Pain in right hip: Secondary | ICD-10-CM | POA: Diagnosis not present

## 2016-09-02 DIAGNOSIS — M25651 Stiffness of right hip, not elsewhere classified: Secondary | ICD-10-CM

## 2016-09-02 NOTE — Therapy (Signed)
[facility] Out[facility] Rehabilitation [facility] [address] [place], [place], [address] Phone: [phone]   Fax:  [phone]  Physical Therapy Treatment  Patient Details  Name: Katherine Atkins MRN: [id number] Date of Birth: 12-04-1940 Referring Provider: Kathryne Hitch, MD  Encounter Date: 09/02/2016      PT End of Session - 09/02/16 0936    Visit Number 4   Number of Visits 9   Date for PT Re-Evaluation 09/24/16   Authorization Type UHC MCR   PT Start Time 0932   PT Stop Time 1015   PT Time Calculation (min) 43 min      Past Medical History:  Diagnosis Date  . Blood dyscrasia   . Cataract   . Fever 08/27/2013  . Heart murmur   . Osteoporosis     Past Surgical History:  Procedure Laterality Date  . CATARACT EXTRACTION  2013  . EYE SURGERY      There were no vitals filed for this visit.      Subjective Assessment - 09/02/16 0934    Subjective I took it easy. 4 tenths of a mile walking I felt the anterior hip start to hurt but it works itself out. Ache 3-4/10. Morning with first weight bearing 4/10 quick sharp pain.    Currently in Pain? No/denies                         [facility] Adult PT Treatment/Exercise - 09/02/16 0001      Lumbar Exercises: Supine   Other Supine Lumbar Exercises Pilates spring board. single leg arcs and circles yellow springs   Other Supine Lumbar Exercises Pilates Reformer foot work on heels and toes then single leg Rt, also worked ER on heels, Bridge all springs                      PT Long Term Goals - 08/24/16 1302      PT LONG TERM GOAL #1   Title FOTO to 29% limitation to indicate significant improvement in functional ability by 7/20   Baseline 40% limitation at eval   Time 4   Period Weeks   Status New     PT LONG TERM GOAL #2   Title Pt will be able to ride her horse without limitation by hip pain to return to PLOF   Baseline not riding due to fear of falling at eval   Time 4   Period Weeks   Status New     PT LONG TERM GOAL #3   Title Pt will verbalize resolution of hip "giving out" to decrease fall risk   Baseline so far has been lucky and had something to grab on to when her hip has given out   Time 4   Period Weeks   Status New     PT LONG TERM GOAL #4   Title Hip MMT 5/5 to indicate proper support from surrounding musculature to decrease demand on illiopsoas   Baseline N/T at eval due to pain   Time 4   Period Weeks   Status New               Plan - 09/02/16 1101    Clinical Impression Statement Challenged lumbopelvic stability and hip strength with pilates reformer and spring board. Pt complete all exercises without pain. Afterward pt reports soeness in hip adductors and gluteals. Encouraged pt to remeber body mechanics, engage core and take breaks today  as she plans to try to move tree limbs again.    PT Next Visit Plan abdominal activation, illiopsoas release prn   PT Home Exercise Plan thomas test stretch, glut sets, pelvic mobility (anterior and posterior pelvic tilts, neutral spine abdominal and pelvic floor engagement with unilateral clams and bent knee raises, figure 4 push, bridge    Consulted and Agree with Plan of Care Patient      Patient will benefit from skilled therapeutic intervention in order to improve the following deficits and impairments:  Decreased range of motion, Increased muscle spasms, Decreased activity tolerance, Pain, Impaired flexibility, Decreased strength, Postural dysfunction  Visit Diagnosis: Pain in right hip  Stiffness of right hip, not elsewhere classified     Problem List Patient Active Problem List   Diagnosis Date Noted  . Rash and nonspecific skin eruption 08/28/2013  . Fever, unspecified 08/27/2013  . Pancytopenia (HCC) 08/27/2013  . Elevated liver function tests 08/27/2013  . Leukopenia 08/25/2013  . HEMORRHOIDS, INTERNAL 01/05/2010  . FECAL INCONTINENCE 01/05/2010    Sherrie Mustacheonoho,  [name], PTA 09/02/2016, 11:04 AM  [facility] [address] [place], [place], [address] Phone: [phone]   Fax:  [phone]  Name: Katherine Atkins MRN: [id number] Date of Birth: [birth year]

## 2016-09-07 ENCOUNTER — Ambulatory Visit: Payer: Medicare Other | Attending: Internal Medicine | Admitting: Physical Therapy

## 2016-09-07 ENCOUNTER — Encounter: Payer: Self-pay | Admitting: Physical Therapy

## 2016-09-07 DIAGNOSIS — M25551 Pain in right hip: Secondary | ICD-10-CM | POA: Diagnosis not present

## 2016-09-07 DIAGNOSIS — M25651 Stiffness of right hip, not elsewhere classified: Secondary | ICD-10-CM | POA: Diagnosis present

## 2016-09-07 NOTE — Therapy (Addendum)
[facility] [address] [place], [place], [address] Phone: [phone]   Fax:  [phone]  Physical Therapy Treatment  Patient Details  Name: Katherine Atkins MRN: [id number] Date of Birth: 07/14/1940 Referring Provider: Kathryne HitchBlackman, Christopher Y, MD  Encounter Date: 09/07/2016      PT End of Session - 09/07/16 1149    Visit Number 5   Number of Visits 9   Date for PT Re-Evaluation 09/24/16   Authorization Type UHC MCR   PT Start Time 1149   PT Stop Time 1231   PT Time Calculation (min) 42 min   Activity Tolerance Patient tolerated treatment well   Behavior During Therapy [facility] for tasks assessed/performed      Past Medical History:  Diagnosis Date  . Blood dyscrasia   . Cataract   . Fever 08/27/2013  . Heart murmur   . Osteoporosis     Past Surgical History:  Procedure Laterality Date  . CATARACT EXTRACTION  2013  . EYE SURGERY      There were no vitals filed for this visit.      Subjective Assessment - 09/07/16 1149    Subjective Will still give out but is now able to catch herself. Is doing her exerciess and feels more confident on her hip. Feels a lot of stretch still in figure 4. Feels soreness but no pain today.    Patient Stated Goals ride her horse, decrease pain, reduce giving out   Currently in Pain? No/denies                         [facility] Adult PT Treatment/Exercise - 09/07/16 0001      Knee/Hip Exercises: Stretches   Passive Hamstring Stretch Limitations supine with green strap   Piriformis Stretch Limitations figure 4     Knee/Hip Exercises: Aerobic   Nustep L5 6 min     Knee/Hip Exercises: Standing   Functional Squat Limitations using cone for lift cue   SLS with mini chops holding green plyo ball   Other Standing Knee Exercises folgers lift     Knee/Hip Exercises: Supine   Bridges with Clamshell 20 reps   Other Supine Knee/Hip Exercises hooklying alt LE clam red tband     Knee/Hip Exercises: Sidelying   Clams x30 each                PT Education - 09/07/16 1236    Education provided Yes   Education Details exercise form/rationale, HEP, try yoga if she would like-ask for modifications.    Person(s) Educated Patient   Methods Explanation;Demonstration;Tactile cues;Verbal cues;Handout   Comprehension Verbalized understanding;Returned demonstration;Verbal cues required;Tactile cues required;Need further instruction             PT Long Term Goals - 08/24/16 1302      PT LONG TERM GOAL #1   Title FOTO to 29% limitation to indicate significant improvement in functional ability by 7/20   Baseline 40% limitation at eval   Time 4   Period Weeks   Status New     PT LONG TERM GOAL #2   Title Pt will be able to ride her horse without limitation by hip pain to return to PLOF   Baseline not riding due to fear of falling at eval   Time 4   Period Weeks   Status New     PT LONG TERM GOAL #3   Title Pt will verbalize resolution of hip "  giving out" to decrease fall risk   Baseline so far has been lucky and had something to grab on to when her hip has given out   Time 4   Period Weeks   Status New     PT LONG TERM GOAL #4   Title Hip MMT 5/5 to indicate proper support from surrounding musculature to decrease demand on illiopsoas   Baseline N/T at eval due to pain   Time 4   Period Weeks   Status New               Plan - 09/07/16 1235    Clinical Impression Statement Pt had significant difficulty with exercises today and required encouragement for repetitions. Poor balance on single leg paired with hip weakness leads to risk of falls and pain with poor body mechanics.    PT Treatment/Interventions ADLs/Self Care Home Management;Cryotherapy;Electrical Stimulation;Iontophoresis 4mg /ml Dexamethasone;Functional mobility training;Stair training;Gait training;Ultrasound;Traction;Moist Heat;Therapeutic activities;Therapeutic exercise;Balance  training;Neuromuscular re-education;Patient/family education;Passive range of motion;Manual techniques;Dry needling;Taping   PT Next Visit Plan standing SLS and squatting-functional lifting   PT Home Exercise Plan thomas test stretch, glut sets, pelvic mobility (anterior and posterior pelvic tilts, neutral spine abdominal and pelvic floor engagement with unilateral clams and bent knee raises, figure 4 push, bridge; clams, bridge, golfers lift, squat   Consulted and Agree with Plan of Care Patient      Patient will benefit from skilled therapeutic intervention in order to improve the following deficits and impairments:  Decreased range of motion, Increased muscle spasms, Decreased activity tolerance, Pain, Impaired flexibility, Decreased strength, Postural dysfunction  Visit Diagnosis: Pain in right hip     Problem List Patient Active Problem List   Diagnosis Date Noted  . Rash and nonspecific skin eruption 08/28/2013  . Fever, unspecified 08/27/2013  . Pancytopenia (HCC) 08/27/2013  . Elevated liver function tests 08/27/2013  . Leukopenia 08/25/2013  . HEMORRHOIDS, INTERNAL 01/05/2010  . FECAL INCONTINENCE 01/05/2010   Rodolph Hagemann C. Innocence Schlotzhauer PT, DPT 09/07/16 12:38 PM   [facility] [address] [place], [place], [address] Phone: [phone]   Fax:  [phone]  Name: Katherine Atkins MRN: [id number] Date of Birth: 1940-04-01

## 2016-09-10 ENCOUNTER — Ambulatory Visit: Payer: Medicare Other | Admitting: Physical Therapy

## 2016-09-10 DIAGNOSIS — M25651 Stiffness of right hip, not elsewhere classified: Secondary | ICD-10-CM

## 2016-09-10 DIAGNOSIS — M25551 Pain in right hip: Secondary | ICD-10-CM | POA: Diagnosis not present

## 2016-09-10 NOTE — Therapy (Signed)
[facility] [address] [place], [place], [address] Phone: [phone]   Fax:  [phone]  Physical Therapy Treatment  Patient Details  Name: Katherine Atkins MRN: [id number] Date of Birth: September 05, 1940 Referring Provider: Kathryne HitchBlackman, Christopher Y, MD  Encounter Date: 09/10/2016      PT End of Session - 09/10/16 0915    Visit Number 6   Number of Visits 9   Date for PT Re-Evaluation 09/24/16   Authorization Type UHC MCR   PT Start Time 0840   PT Stop Time 0925   PT Time Calculation (min) 45 min      Past Medical History:  Diagnosis Date  . Blood dyscrasia   . Cataract   . Fever 08/27/2013  . Heart murmur   . Osteoporosis     Past Surgical History:  Procedure Laterality Date  . CATARACT EXTRACTION  2013  . EYE SURGERY      There were no vitals filed for this visit.                       [facility] Adult PT Treatment/Exercise - 09/10/16 0001      Knee/Hip Exercises: Stretches   Piriformis Stretch Limitations figure 4 push 3 x 30 sec , pull      Knee/Hip Exercises: Aerobic   Nustep L7 5 min     Knee/Hip Exercises: Standing   Functional Squat 2 sets;10 seconds   Functional Squat Limitations using mat table for hip hinge.    Other Standing Knee Exercises golfers lift      Knee/Hip Exercises: Supine   Bridges with Clamshell 20 reps   Other Supine Knee/Hip Exercises hooklying alt LE clam red tband     Knee/Hip Exercises: Sidelying   Clams x30 each                PT Education - 09/10/16 0918    Education provided Yes   Education Details HEP   Person(s) Educated Patient   Methods Explanation;Handout   Comprehension Verbalized understanding             PT Long Term Goals - 08/24/16 1302      PT LONG TERM GOAL #1   Title FOTO to 29% limitation to indicate significant improvement in functional ability by 7/20   Baseline 40% limitation at eval   Time 4   Period Weeks   Status New      PT LONG TERM GOAL #2   Title Pt will be able to ride her horse without limitation by hip pain to return to PLOF   Baseline not riding due to fear of falling at eval   Time 4   Period Weeks   Status New     PT LONG TERM GOAL #3   Title Pt will verbalize resolution of hip "giving out" to decrease fall risk   Baseline so far has been lucky and had something to grab on to when her hip has given out   Time 4   Period Weeks   Status New     PT LONG TERM GOAL #4   Title Hip MMT 5/5 to indicate proper support from surrounding musculature to decrease demand on illiopsoas   Baseline N/T at eval due to pain   Time 4   Period Weeks   Status New               Plan - 09/10/16 0857    Clinical Impression Statement  Less epsiodes of hip giving out. Working back up to 8 laps walking, currently up to 6 3/5ths. No longer assisting left leg using hands. Progressing toward goals. Fatigues quickly with closed chain. Pt reports "charlie horse" from clam exercises. Issued pull across piriformis stretch for HEP. Pt reports this is helpful.    PT Next Visit Plan standing SLS and squatting-functional lifting   PT Home Exercise Plan thomas test stretch, glut sets, pelvic mobility (anterior and posterior pelvic tilts, neutral spine abdominal and pelvic floor engagement with unilateral clams and bent knee raises, figure 4 push, bridge; clams, bridge, golfers lift, squat   Consulted and Agree with Plan of Care Patient      Patient will benefit from skilled therapeutic intervention in order to improve the following deficits and impairments:  Decreased range of motion, Increased muscle spasms, Decreased activity tolerance, Pain, Impaired flexibility, Decreased strength, Postural dysfunction  Visit Diagnosis: Pain in right hip  Stiffness of right hip, not elsewhere classified     Problem List Patient Active Problem List   Diagnosis Date Noted  . Rash and nonspecific skin eruption 08/28/2013  .  Fever, unspecified 08/27/2013  . Pancytopenia (HCC) 08/27/2013  . Elevated liver function tests 08/27/2013  . Leukopenia 08/25/2013  . HEMORRHOIDS, INTERNAL 01/05/2010  . FECAL INCONTINENCE 01/05/2010    Sherrie Mustache, PTA 09/10/2016, 9:37 AM  [facility] [address] [place], [place], [address] Phone: [phone]   Fax:  [phone]  Name: Katherine Atkins MRN: [id number] Date of Birth: Aug 15, 1940

## 2016-09-13 ENCOUNTER — Ambulatory Visit (INDEPENDENT_AMBULATORY_CARE_PROVIDER_SITE_OTHER): Payer: Medicare Other | Admitting: Orthopaedic Surgery

## 2016-09-13 ENCOUNTER — Encounter (INDEPENDENT_AMBULATORY_CARE_PROVIDER_SITE_OTHER): Payer: Self-pay | Admitting: Orthopaedic Surgery

## 2016-09-13 DIAGNOSIS — M25551 Pain in right hip: Secondary | ICD-10-CM

## 2016-09-13 NOTE — Progress Notes (Signed)
The patient is following up after dealing with her right hip. [facility] and said they really were able to get that was bothering her the most. She said her pain is minimal and she feels like they are "curing the problem".  On examination of her right hip her range of motion is fluid and full. There is no pain in the groin. There is no pain of the trochanteric area. She basically normal hip exam today. Her back exam is also normal.  Given her great improvement she'll follow-up as needed at this point she'll finish out her physical therapy as well. All questions were encouraged and answered.

## 2016-09-14 ENCOUNTER — Encounter: Payer: Self-pay | Admitting: Physical Therapy

## 2016-09-14 ENCOUNTER — Ambulatory Visit: Payer: Medicare Other | Admitting: Physical Therapy

## 2016-09-14 DIAGNOSIS — M25551 Pain in right hip: Secondary | ICD-10-CM | POA: Diagnosis not present

## 2016-09-14 DIAGNOSIS — M25651 Stiffness of right hip, not elsewhere classified: Secondary | ICD-10-CM

## 2016-09-14 NOTE — Therapy (Signed)
[place], [place], [address] Phone: [phone]   Fax:  [phone]  Physical Therapy Treatment/Discharge Summary  Patient Details  Name: Katherine Atkins MRN: [id number] Date of Birth: 07-29-1940 Referring Provider: Mcarthur Rossetti, MD  Encounter Date: 09/14/2016      PT End of Session - 09/14/16 1017    Visit Number 7   Number of Visits 9   Date for PT Re-Evaluation 09/24/16   Authorization Type UHC MCR   PT Start Time 1017   PT Stop Time 1051   PT Time Calculation (min) 34 min   Activity Tolerance Patient tolerated treatment well   Behavior During Therapy [facility] for tasks assessed/performed      Past Medical History:  Diagnosis Date  . Blood dyscrasia   . Cataract   . Fever 08/27/2013  . Heart murmur   . Osteoporosis     Past Surgical History:  Procedure Laterality Date  . CATARACT EXTRACTION  2013  . EYE SURGERY      There were no vitals filed for this visit.      Subjective Assessment - 09/14/16 1018    Subjective Pt reports being released from MD. Feels okay today. Swam, walked and did 5 min on nustep at [facility]. Practiced her balance.    Patient Stated Goals ride her horse, decrease pain, reduce giving out   Currently in Pain? No/denies                         [facility] Adult PT Treatment/Exercise - 09/14/16 0001      Knee/Hip Exercises: Stretches   Active Hamstring Stretch Limitations x10 each   Hip Flexor Stretch Limitations kneeling hip flexor stretch   Gastroc Stretch Limitations 2x30s slant board   Other Knee/Hip Stretches figure 4     Knee/Hip Exercises: Aerobic   Nustep L5 5 min                PT Education - 09/14/16 1053    Education provided Yes   Education Details exercise form/rationale, progress with goals, importance of stretching, safety when challenging balance.    Person(s) Educated Patient   Methods Explanation;Demonstration;Tactile  cues;Verbal cues;Handout   Comprehension Verbalized understanding;Returned demonstration;Verbal cues required;Tactile cues required;Need further instruction             PT Long Term Goals - 09/14/16 1040      PT LONG TERM GOAL #1   Title FOTO to 29% limitation to indicate significant improvement in functional ability by 7/20   Baseline 13% limitation   Status Achieved     PT LONG TERM GOAL #2   Title Pt will be able to ride her horse without limitation by hip pain to return to PLOF   Baseline it has been too hot, has not tried yet.   Status Unable to assess     PT LONG TERM GOAL #3   Title Pt will verbalize resolution of hip "giving out" to decrease fall risk   Baseline reports resolution   Status Achieved     PT LONG TERM GOAL #4   Title Hip MMT 5/5 to indicate proper support from surrounding musculature to decrease demand on illiopsoas   Baseline 5/5 gross   Status Achieved               Plan - 09/14/16 1054    Clinical Impression Statement Pt has met her goals and is d/c from PT  at this time. Pt verbalized comfort and understanding of independent exercise program. Pt was instructed to contact us with any further questions.    PT Treatment/Interventions ADLs/Self Care Home Management;Cryotherapy;Electrical Stimulation;Iontophoresis '4mg'$ /ml Dexamethasone;Functional mobility training;Stair training;Gait training;Ultrasound;Traction;Moist Heat;Therapeutic activities;Therapeutic exercise;Balance training;Neuromuscular re-education;Patient/family education;Passive range of motion;Manual techniques;Dry needling;Taping   Consulted and Agree with Plan of Care Patient      Patient will benefit from skilled therapeutic intervention in order to improve the following deficits and impairments:  Decreased range of motion, Increased muscle spasms, Decreased activity tolerance, Pain, Impaired flexibility, Decreased strength, Postural dysfunction  Visit Diagnosis: Pain in right  hip  Stiffness of right hip, not elsewhere classified       G-Codes - 09-25-16 1055    Functional Assessment Tool Used (Outpatient Only) FOTO 13% limited, clinical judgement   Functional Limitation Mobility: Walking and moving around   Mobility: Walking and Moving Around Goal Status [phone]) At least 20 percent but less than 40 percent impaired, limited or restricted   Mobility: Walking and Moving Around Discharge Status [phone]) At least 1 percent but less than 20 percent impaired, limited or restricted      Problem List Patient Active Problem List   Diagnosis Date Noted  . Rash and nonspecific skin eruption 08/28/2013  . Fever, unspecified 08/27/2013  . Pancytopenia ([place]) 08/27/2013  . Elevated liver function tests 08/27/2013  . Leukopenia 08/25/2013  . HEMORRHOIDS, INTERNAL 01/05/2010  . FECAL INCONTINENCE 01/05/2010   PHYSICAL THERAPY DISCHARGE SUMMARY  Visits from Start of Care: 7  Current functional level related to goals / functional outcomes: See above   Remaining deficits: See above   Education / Equipment: Anatomy of condition, POC, HEP, exercise form/rationale  Plan: Patient agrees to discharge.  Patient goals were met. Patient is being discharged due to meeting the stated rehab goals.  ?????     Jahnyla Parrillo C. Tuff Clabo PT, DPT 2016-09-25 10:56 AM   [facility] [address] [place], [place], [address] Phone: [phone]   Fax:  [phone]  Name: Katherine Atkins MRN: [id number] Date of Birth: [birth year]

## 2016-09-16 ENCOUNTER — Ambulatory Visit: Payer: Medicare Other | Admitting: Physical Therapy

## 2016-09-21 ENCOUNTER — Encounter: Payer: Medicare Other | Admitting: Physical Therapy

## 2016-09-23 ENCOUNTER — Encounter: Payer: Medicare Other | Admitting: Physical Therapy

## 2018-05-15 ENCOUNTER — Ambulatory Visit (INDEPENDENT_AMBULATORY_CARE_PROVIDER_SITE_OTHER): Payer: Medicare Other

## 2018-05-15 ENCOUNTER — Encounter (INDEPENDENT_AMBULATORY_CARE_PROVIDER_SITE_OTHER): Payer: Self-pay | Admitting: Orthopaedic Surgery

## 2018-05-15 ENCOUNTER — Ambulatory Visit (INDEPENDENT_AMBULATORY_CARE_PROVIDER_SITE_OTHER): Payer: Medicare Other | Admitting: Orthopaedic Surgery

## 2018-05-15 DIAGNOSIS — M25562 Pain in left knee: Secondary | ICD-10-CM

## 2018-05-15 MED ORDER — LIDOCAINE HCL 1 % IJ SOLN
3.0000 mL | INTRAMUSCULAR | Status: AC | PRN
  Administered 2018-05-15: 3 mL

## 2018-05-15 MED ORDER — METHYLPREDNISOLONE ACETATE 40 MG/ML IJ SUSP
40.0000 mg | INTRAMUSCULAR | Status: AC | PRN
  Administered 2018-05-15: 40 mg via INTRA_ARTICULAR

## 2018-05-15 NOTE — Progress Notes (Signed)
Office Visit Note   Patient: Katherine Atkins           Date of Birth: [birth year]           MRN: [id number] Visit Date: 05/15/2018              Requested by: Geoffry Paradise, MD [address] [place], [place] [address] PCP: Geoffry Paradise, MD   Assessment & Plan: Visit Diagnoses:  1. Acute pain of left knee     Plan: Based on her signs and symptoms and clinical exam I am concerned about a lateral meniscal tear.  I do want her to try a knee sleeve and try at least a one-time steroid injection in the knee to see what it does for her symptoms.  I explained the rationale behind this as well as the risk and benefits.  She did tolerate the steroid injection well.  I would like to see her back in just 2 weeks to see how she is doing from a clinical exam standpoint.  We may need to consider an MRI of her left knee to rule out a meniscal tear.  Follow-Up Instructions: Return in about 2 years (around 05/14/2020).   Orders:  Orders Placed This Encounter  Procedures  . Large Joint Inj  . XR Knee 1-2 Views Left   No orders of the defined types were placed in this encounter.     Procedures: Large Joint Inj: L knee on 05/15/2018 3:54 PM Indications: diagnostic evaluation and pain Details: 22 G 1.5 in needle, superolateral approach  Arthrogram: No  Medications: 3 mL lidocaine 1 %; 40 mg methylPREDNISolone acetate 40 MG/ML Outcome: tolerated well, no immediate complications Procedure, treatment alternatives, risks and benefits explained, specific risks discussed. Consent was given by the patient. Immediately prior to procedure a time out was called to verify the correct patient, procedure, equipment, support staff and site/side marked as required. Patient was prepped and draped in the usual sterile fashion.       Clinical Data: No additional findings.   Subjective: Chief Complaint  Patient presents with  . Left Knee - Pain  The patient is very active and athletic 78 year old female  who reports left knee pain since she stepped in a hole with boots on and twisted her knee back on January 28.  She still has pain on the lateral aspect of her knee with locking and catching.  She did have swelling at one point but she is not having swelling now.  She still having to protect that knee from activities and occasional walks stiff leg to try to protect the knee.  She had never injured that knee before and did not have problems with the knee. HPI  Review of Systems She currently denies a headache, chest pain, shortness of breath, fever, chills, nausea, vomiting  Objective: Vital Signs: There were no vitals taken for this visit.  Physical Exam She is alert and orient x3 and in no acute distress she appears younger than her stated age. Ortho Exam Examination of her left knee shows no effusion but she does have lateral joint line tenderness and pain along the lateral collateral ligament and the IT band.  She has a positive McMurray sign to the lateral compartment of the knee.  Otherwise her knee patella tracks well. Specialty Comments:  No specialty comments available.  Imaging: Xr Knee 1-2 Views Left  Result Date: 05/15/2018 2 views of the left knee show no acute findings.  There is osteopenic bone but  the joint space is well aligned and always shows slight narrowing.    PMFS History: Patient Active Problem List   Diagnosis Date Noted  . Rash and nonspecific skin eruption 08/28/2013  . Fever, unspecified 08/27/2013  . Pancytopenia (HCC) 08/27/2013  . Elevated liver function tests 08/27/2013  . Leukopenia 08/25/2013  . HEMORRHOIDS, INTERNAL 01/05/2010  . FECAL INCONTINENCE 01/05/2010   Past Medical History:  Diagnosis Date  . Blood dyscrasia   . Cataract   . Fever 08/27/2013  . Heart murmur   . Osteoporosis     Family History  Problem Relation Age of Onset  . Kidney disease Father   . Heart disease Maternal Grandfather   . Heart disease Paternal Grandmother       Past Surgical History:  Procedure Laterality Date  . CATARACT EXTRACTION  2013  . EYE SURGERY     Social History   Occupational History  . Not on file  Tobacco Use  . Smoking status: Never Smoker  . Smokeless tobacco: Never Used  Substance and Sexual Activity  . Alcohol use: No  . Drug use: No  . Sexual activity: Yes

## 2018-05-29 ENCOUNTER — Ambulatory Visit (INDEPENDENT_AMBULATORY_CARE_PROVIDER_SITE_OTHER): Payer: Medicare Other | Admitting: Orthopaedic Surgery

## 2018-06-22 ENCOUNTER — Encounter (INDEPENDENT_AMBULATORY_CARE_PROVIDER_SITE_OTHER): Payer: Self-pay | Admitting: Orthopaedic Surgery

## 2018-06-26 ENCOUNTER — Encounter (INDEPENDENT_AMBULATORY_CARE_PROVIDER_SITE_OTHER): Payer: Self-pay | Admitting: Orthopaedic Surgery

## 2018-06-26 ENCOUNTER — Other Ambulatory Visit: Payer: Self-pay

## 2018-06-26 ENCOUNTER — Ambulatory Visit (INDEPENDENT_AMBULATORY_CARE_PROVIDER_SITE_OTHER): Payer: Medicare Other | Admitting: Orthopaedic Surgery

## 2018-06-26 DIAGNOSIS — M7632 Iliotibial band syndrome, left leg: Secondary | ICD-10-CM

## 2018-06-26 DIAGNOSIS — M25562 Pain in left knee: Secondary | ICD-10-CM

## 2018-06-26 NOTE — Progress Notes (Signed)
The patient is very well-known to me.  We have been seeing her for acute left knee pain.  At her last visit I did place a steroid injection in the left knee.  This did not help a lot.  She denies any locking catching but she points to the lateral aspect of her knee as source of her pain.  She is tried some stretching exercises as it relates to the lateral collateral ligament.  She denies any locking catching in her knee she denies any left knee swelling.  On examination of her left knee her McMurray's exam is negative to the lateral compartment and there is no lateral joint line tenderness.  She is tender over the lateral IT band.  The knee feels ligamentously stable.  I pointed her in the direction of stretching for IT band syndrome.  I think this will help her quite a bit.  My neck step would be to set her up for formal outpatient physical therapy if this does not work.  All questions concerns were answered and addressed.

## 2018-08-31 ENCOUNTER — Encounter: Payer: Self-pay | Admitting: Orthopaedic Surgery

## 2018-09-06 ENCOUNTER — Other Ambulatory Visit: Payer: Self-pay

## 2018-09-06 DIAGNOSIS — M25562 Pain in left knee: Secondary | ICD-10-CM

## 2018-09-18 ENCOUNTER — Ambulatory Visit: Payer: Medicare Other | Attending: Orthopaedic Surgery | Admitting: Physical Therapy

## 2018-09-18 ENCOUNTER — Encounter: Payer: Self-pay | Admitting: Physical Therapy

## 2018-09-18 ENCOUNTER — Other Ambulatory Visit: Payer: Self-pay

## 2018-09-18 DIAGNOSIS — G8929 Other chronic pain: Secondary | ICD-10-CM | POA: Diagnosis present

## 2018-09-18 DIAGNOSIS — R2689 Other abnormalities of gait and mobility: Secondary | ICD-10-CM | POA: Insufficient documentation

## 2018-09-18 DIAGNOSIS — M25562 Pain in left knee: Secondary | ICD-10-CM | POA: Diagnosis not present

## 2018-09-18 NOTE — Therapy (Signed)
[facility] [address] [place], [place], [address] Phone: [phone]   Fax:  [phone]  Physical Therapy Evaluation  Patient Details  Name: Katherine StandardGail Delatorre MRN: [id number] Date of Birth: 15-Jul-1940 Referring Provider (PT): Dr Doneen Poissonhristopher Blackman    Encounter Date: 09/18/2018  PT End of Session - 09/18/18 1032    Visit Number  1    Number of Visits  6    Date for PT Re-Evaluation  10/30/18    Authorization Type  UHC medicaire 10 visit progress note 15 visit Kx modifier    PT Start Time  0915    PT Stop Time  1010    PT Time Calculation (min)  55 min    Activity Tolerance  Patient tolerated treatment well    Behavior During Therapy  [facility] for tasks assessed/performed       Past Medical History:  Diagnosis Date  . Blood dyscrasia   . Cataract   . Fever 08/27/2013  . Heart murmur   . Osteoporosis     Past Surgical History:  Procedure Laterality Date  . CATARACT EXTRACTION  2013  . EYE SURGERY      There were no vitals filed for this visit.   Subjective Assessment - 09/18/18 0927    Subjective  Patient began having lateral knee pain in Febuary. [place] farm and is very [place]. She feels like she is walking flat footed. 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 she feels like she is having weakness going down the steps.    Pertinent History  right hip discomfort 2018    Limitations  Standing;Walking    How long can you sit comfortably?  No limit    How long can you stand comfortably?  No limit    How long can you walk comfortably?  pain after long distances    Diagnostic tests  X-ray: No significant degeneration or joint space loss    Patient Stated Goals  to have no pain/ improve her gait    Currently in Pain?  Yes    Pain Score  3     Pain Location  Knee    Pain Orientation  Left    Pain Descriptors / Indicators  Aching    Pain Type  Chronic pain    Pain Onset  More than a month ago    Pain Frequency  Intermittent    Aggravating Factors   when she is up a lot    Pain Relieving Factors  resting    Effect of Pain on Daily Activities  pain when she is up on her knee for long periods of time.         [facility] - 09/18/18 0001      Assessment   Medical Diagnosis  Left knee pain     Referring Provider (PT)  Dr Doneen Poissonhristopher Blackman     Prior Therapy  for her right hip       Precautions   Precautions  None      Restrictions   Weight Bearing Restrictions  No      Balance Screen   Has the patient fallen in the past 6 months  Yes    How many times?  --   Has had many falls    Has the patient had a decrease in activity level because of a fear of falling?   No  fell 9 days ago    Is the patient reluctant to leave their home because of a fear of falling?   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    Leisure  likes to swim and walk       Cognition   Overall Cognitive Status  Within Functional Limits for tasks assessed    Attention  Focused    Focused Attention  Appears intact    Memory  Appears intact    Awareness  Appears intact    Problem Solving  Appears intact      Observation/Other Assessments   Observations  bilateral flat foot L > R with pronation     Focus on Therapeutic Outcomes (FOTO)   37% limitation       Sensation   Light Touch  Appears Intact    Additional Comments  denies parathesias       Coordination   Gross Motor Movements are Fluid and Coordinated  Yes    Fine Motor Movements are Fluid and Coordinated  Yes      ROM / Strength   AROM / PROM / Strength  AROM;PROM;Strength      AROM   Overall AROM Comments  pain at end range of flexion       Strength   Strength  Assessment Site  Knee;Hip    Right/Left Hip  Right;Left    Right Hip Flexion  4/5    Right Hip ABduction  4+/5    Right Hip ADduction  4+/5    Left Hip Flexion  4/5    Left Hip ABduction  4+/5    Left Hip ADduction  4+/5      Palpation   Patella mobility  normal patellar movement     Palpation comment  mild tenderness to palapation in the left IT band       Special Tests   Other special tests  mcmurrys test (+) lateral on left valgus/verus (-)       Ambulation/Gait   Gait Comments  lateral movement to the left with gait.                 Objective measurements completed on examination: See above findings.      [facility] Adult PT Treatment/Exercise - 09/18/18 0001      Exercises   Exercises  Knee/Hip      Knee/Hip Exercises: Stretches   Other Knee/Hip Stretches  IT band roll out with cardboard roller      Knee/Hip Exercises: Supine   Bridges Limitations  2x10     Straight Leg Raises Limitations  x10 left     Other Supine Knee/Hip Exercises  supine hip abduction 2x10              PT Education - 09/18/18 1026    Education Details  HEP, symptom mangement;    Person(s) Educated  Patient    Methods  Explanation;Demonstration    Comprehension  Verbalized understanding;Returned demonstration;Verbal cues required;Tactile cues required;Need further instruction       PT Short Term Goals - 09/18/18 1055      PT SHORT TERM GOAL #1   Title  Patient  will increase bilateral LE strength to 5/5    Time  3    Period  Weeks    Status  New    Target Date  10/09/18      PT SHORT TERM GOAL #2   Title  Patient will report no pain with active right knee motion    Time  3    Period  Weeks    Target Date  10/09/18      PT SHORT TERM GOAL #3   Title  Patient will be independent with intial HEP    Time  3    Period  Weeks    Target Date  10/09/18        PT Long Term Goals - 09/18/18 1056      PT LONG TERM GOAL #1   Title  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 Her left knee is most painfull when she walks long ditances or twits. signs and symptos are consitent with IT band irritation as well as likley lateral meniscal involvement. She had a positive Mcmurrys test for the lateral meniscus. She notices popping in her lsateral knee when she sits for too long. She has pain with end range flexion. She would benefit from skilled therapy to improve left LE stabiity. She is very motivated. She was also given a heel lift to try in her walking shoes. She does have lateral movement with gait especially with left single leg stance phase.    Personal Factors and Comorbidities  Comorbidity 1    Comorbidities  osteoperosis    Examination-Activity Limitations  Stairs;Locomotion Level    Examination-Participation Restrictions  Community Activity;Yard Work    Stability/Clinical Decision Making  Stable/Uncomplicated    OptometristClinical Decision Making  Low    Rehab Potential  Excellent    PT Frequency  1x / week    PT Duration  6 weeks    PT Treatment/Interventions  ADLs/Self Care Home Management;Electrical Stimulation;Iontophoresis 4mg /ml Dexamethasone;Moist Heat;Ultrasound;Gait training;Stair training;Functional mobility training;Therapeutic activities;Therapeutic exercise;Patient/family education;Manual techniques;Neuromuscular re-education;Passive range of motion;Taping    PT Next Visit Plan  review strengthening  exercises; add nu-step; standing exercises; side lying hip abduction, consider SAQ; hamstring and IT band stretch; consdier manual therapy to IT band. Patient also has a pool.    PT Home Exercise Plan  bridging, SLR, hip abduction    Consulted and Agree with Plan of Care  Patient       Patient will benefit from skilled therapeutic intervention in order to improve the following deficits and impairments:  Abnormal gait, Decreased mobility, Increased muscle spasms, Improper body mechanics, Decreased activity tolerance, Decreased strength, Pain, Increased fascial restricitons, Decreased range of motion  Visit Diagnosis: 1. Chronic pain of left knee   2. Other abnormalities of gait and mobility        Problem List Patient Active Problem List   Diagnosis Date Noted  . Rash and nonspecific skin eruption 08/28/2013  . Fever, unspecified 08/27/2013  . Pancytopenia (HCC) 08/27/2013  . Elevated liver function tests 08/27/2013  . Leukopenia 08/25/2013  . HEMORRHOIDS, INTERNAL 01/05/2010  . FECAL INCONTINENCE 01/05/2010    Dessie Comaavid J Altovise Wahler PT DPT  09/18/2018, 11:49 AM  [facility] [address] [place], [place], [address] Phone: [phone]   Fax:  [phone]  Name: Katherine StandardGail Brancato MRN: [id number] Date of Birth: 12-11-1940

## 2018-09-26 ENCOUNTER — Ambulatory Visit: Payer: Medicare Other | Admitting: Physical Therapy

## 2018-09-26 ENCOUNTER — Other Ambulatory Visit: Payer: Self-pay

## 2018-09-26 ENCOUNTER — Encounter: Payer: Self-pay | Admitting: Physical Therapy

## 2018-09-26 DIAGNOSIS — M25562 Pain in left knee: Secondary | ICD-10-CM | POA: Diagnosis not present

## 2018-09-26 DIAGNOSIS — R2689 Other abnormalities of gait and mobility: Secondary | ICD-10-CM

## 2018-09-26 DIAGNOSIS — G8929 Other chronic pain: Secondary | ICD-10-CM

## 2018-09-26 NOTE — Therapy (Signed)
[facility] [address] [place], [place], [address] Phone: [phone]   Fax:  [phone]  Physical Therapy Treatment  Patient Details  Name: Katherine Atkins MRN: [id number] Date of Birth: January 29, 1941 Referring Provider (PT): Dr Doneen Poissonhristopher Blackman    Encounter Date: 09/26/2018  PT End of Session - 09/26/18 0846    Visit Number  2    Number of Visits  6    Date for PT Re-Evaluation  10/30/18    Authorization Type  [employer] medicaire 10 visit progress note 15 visit Kx modifier    PT Start Time  0843    PT Stop Time  0930    PT Time Calculation (min)  47 min       Past Medical History:  Diagnosis Date  . Blood dyscrasia   . Cataract   . Fever 08/27/2013  . Heart murmur   . Osteoporosis     Past Surgical History:  Procedure Laterality Date  . CATARACT EXTRACTION  2013  . EYE SURGERY      There were no vitals filed for this visit.  Subjective Assessment - 09/26/18 0845    Subjective  The knee is hurting me. It hurts when it is swollen. The swelling started yesterday.    Currently in Pain?  Yes    Pain Score  3     Pain Location  Knee    Pain Orientation  Left    Pain Descriptors / Indicators  Aching    Pain Type  Chronic pain    Aggravating Factors   when she is up alot    Pain Relieving Factors  resting                       [facility] Adult PT Treatment/Exercise - 09/26/18 0001      Knee/Hip Exercises: Stretches   Active Hamstring Stretch  Left;3 reps;30 seconds    Active Hamstring Stretch Limitations  strap    ITB Stretch  3 reps;30 seconds    ITB Stretch Limitations  Trial of Side lying and supine with strap       Knee/Hip Exercises: Aerobic   Recumbent Bike  L1 x 5 minutes       Knee/Hip Exercises: Supine   Short Arc Quad Sets  20 reps    Bridges Limitations  3 x 10     Straight Leg Raises Limitations  2 x10 left     Other Supine Knee/Hip Exercises  supine hip abduction 2x10       Manual  Therapy   Manual Therapy  Soft tissue mobilization    Soft tissue mobilization  Manual to lateral thigh and knee                PT Short Term Goals - 09/18/18 1055      PT SHORT TERM GOAL #1   Title  Patient will increase bilateral LE strength to 5/5    Time  3    Period  Weeks    Status  New    Target Date  10/09/18      PT SHORT TERM GOAL #2   Title  Patient will report no pain with active right knee motion    Time  3    Period  Weeks    Target Date  10/09/18      PT SHORT TERM GOAL #3   Title  Patient will be independent with intial HEP    Time  3    Period  Weeks    Target Date  10/09/18        PT Long Term Goals - 09/18/18 1056      PT LONG TERM GOAL #1   Title  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 Reviewed HEP and progressed with hamstring and ITB stretching. Updated HEP. Manual STW performed to lateral thigh and knee. She felt better at end of session. She is rolling her lateral thigh at home .    PT Next Visit Plan  review strengthening exercises; add nu-step; standing exercises; side lying hip abduction, consider SAQ; hamstring and IT band stretch; consdier manual therapy to IT band. Patient also has a pool.    PT Home Exercise Plan  bridging, SLR, hip abduction, supine h/s and ITB stretch with strap    Consulted and Agree with Plan of Care  Patient       Patient will benefit  from skilled therapeutic intervention in order to improve the following deficits and impairments:  Abnormal gait, Decreased mobility, Increased muscle spasms, Improper body mechanics, Decreased activity tolerance, Decreased strength, Pain, Increased fascial restricitons, Decreased range of motion  Visit Diagnosis: 1. Chronic pain of left knee   2. Other abnormalities of gait and mobility        Problem List Patient Active Problem List   Diagnosis Date Noted  . Rash and nonspecific skin eruption 08/28/2013  . Fever, unspecified 08/27/2013  . Pancytopenia ([address]) 08/27/2013  . Elevated liver function tests 08/27/2013  . Leukopenia 08/25/2013  . HEMORRHOIDS, INTERNAL 01/05/2010  . FECAL INCONTINENCE 01/05/2010    Dorene Ar, PTA 09/26/2018, 10:14 AM  [facility] [address] [place], [place], [address] Phone: [phone]   Fax:  [phone]  Name: Katherine Atkins MRN: [id number] Date of Birth: [birth year]

## 2018-10-05 ENCOUNTER — Encounter: Payer: Self-pay | Admitting: Physical Therapy

## 2018-10-05 ENCOUNTER — Other Ambulatory Visit: Payer: Self-pay

## 2018-10-05 ENCOUNTER — Ambulatory Visit: Payer: Medicare Other | Admitting: Physical Therapy

## 2018-10-05 DIAGNOSIS — M25562 Pain in left knee: Secondary | ICD-10-CM | POA: Diagnosis not present

## 2018-10-05 DIAGNOSIS — R2689 Other abnormalities of gait and mobility: Secondary | ICD-10-CM

## 2018-10-05 DIAGNOSIS — G8929 Other chronic pain: Secondary | ICD-10-CM

## 2018-10-05 NOTE — Therapy (Signed)
[facility] [address] [place], [place], [address] Phone: [phone]   Fax:  [phone]  Physical Therapy Treatment  Patient Details  Name: Katherine Atkins MRN: [id number] Date of Birth: 1940/06/25 Referring Provider (PT): Dr Doneen Poissonhristopher Blackman    Encounter Date: 10/05/2018  PT End of Session - 10/05/18 0836    Visit Number  3    Number of Visits  6    Date for PT Re-Evaluation  10/30/18    Authorization Type  [employer] medicaire 10 visit progress note 15 visit Kx modifier    PT Start Time  0830    PT Stop Time  0910    PT Time Calculation (min)  40 min    Activity Tolerance  Patient tolerated treatment well    Behavior During Therapy  [facility]FL for tasks assessed/performed       Past Medical History:  Diagnosis Date  . Blood dyscrasia   . Cataract   . Fever 08/27/2013  . Heart murmur   . Osteoporosis     Past Surgical History:  Procedure Laterality Date  . CATARACT EXTRACTION  2013  . EYE SURGERY      There were no vitals filed for this visit.                    [facility] Adult PT Treatment/Exercise - 10/05/18 0001      Knee/Hip Exercises: Stretches   Active Hamstring Stretch  Left;3 reps;30 seconds    Active Hamstring Stretch Limitations  strap    ITB Stretch  3 reps;30 seconds    ITB Stretch Limitations  Trial of Side lying and supine with strap     Other Knee/Hip Stretches  thomas stretch 3x30 sec hold       Knee/Hip Exercises: Aerobic   Recumbent Bike  L1 x 5 minutes       Knee/Hip Exercises: Standing   Heel Raises Limitations  x20    Hip Flexion Limitations  x10 each leg       Knee/Hip Exercises: Supine   Short Arc Quad Sets  20 reps    Short Arc Quad Sets Limitations  2lb     Bridges Limitations  3 x 10     Straight Leg Raises Limitations  2 x10 left 2lb     Other Supine Knee/Hip Exercises  supine clam shell green 2x10              PT Education - 10/05/18 0830    Education Details   HEP, dymtpom mnagement    Person(s) Educated  Patient    Methods  Explanation;Demonstration    Comprehension  Verbalized understanding;Returned demonstration;Verbal cues required;Tactile cues required       PT Short Term Goals - 10/05/18 1517      PT SHORT TERM GOAL #1   Title  Patient will increase bilateral LE strength to 5/5    Time  3    Period  Weeks    Status  On-going      PT SHORT TERM GOAL #2   Title  Patient will report no pain with active right knee motion    Period  Weeks    Status  On-going    Target Date  10/09/18      PT SHORT TERM GOAL #3   Title  Patient will be independent with intial HEP    Time  3    Period  Weeks    Status  On-going  Target Date  10/09/18        PT Long Term Goals - 09/18/18 1056      PT LONG TERM GOAL #1   Title  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 She tolerated ther-ex well. She was given an updated HEP. She is still having pain but it is moving in the right direction. She was advised to be patient. Therapy will advance exercises as tolerated.Therapy was also able to add standing exercises.    Comorbidities  osteoperosis    Examination-Activity Limitations  Stairs;Locomotion Level    Stability/Clinical Decision Making  Stable/Uncomplicated    Clinical Decision Making  Low    Rehab Potential  Excellent    PT Frequency  1x / week    PT Duration  6 weeks    PT Next Visit Plan  review  strengthening exercises; add nu-step; standing exercises; side lying hip abduction, consider SAQ; hamstring and IT band stretch; consdier manual therapy to IT band. Patient also has a pool.    PT Home Exercise Plan  bridging, SLR, hip abduction, supine h/s and ITB stretch with strap       Patient will benefit from skilled therapeutic intervention in order to improve the following deficits and impairments:  Abnormal gait, Decreased mobility, Increased muscle spasms, Improper body mechanics, Decreased activity tolerance, Decreased strength, Pain, Increased fascial restricitons, Decreased range of motion  Visit Diagnosis: 1. Chronic pain of left knee   2. Other abnormalities of gait and mobility        Problem List Patient Active Problem List   Diagnosis Date Noted  . Rash and nonspecific skin eruption 08/28/2013  . Fever, unspecified 08/27/2013  . Pancytopenia ([place]) 08/27/2013  . Elevated liver function tests 08/27/2013  . Leukopenia 08/25/2013  . HEMORRHOIDS, INTERNAL 01/05/2010  . FECAL INCONTINENCE 01/05/2010    Carney Living 10/05/2018, 3:19 PM  [facility] [address] [place], [place], [address] Phone: [phone]   Fax:  [phone]  Name: Katherine Atkins MRN: [id number] Date of Birth: 09/13/1940

## 2018-10-10 ENCOUNTER — Encounter: Payer: Self-pay | Admitting: Physical Therapy

## 2018-10-10 ENCOUNTER — Ambulatory Visit: Payer: Medicare Other | Attending: Orthopaedic Surgery | Admitting: Physical Therapy

## 2018-10-10 ENCOUNTER — Other Ambulatory Visit: Payer: Self-pay

## 2018-10-10 DIAGNOSIS — R2689 Other abnormalities of gait and mobility: Secondary | ICD-10-CM | POA: Diagnosis present

## 2018-10-10 DIAGNOSIS — M25562 Pain in left knee: Secondary | ICD-10-CM | POA: Insufficient documentation

## 2018-10-10 DIAGNOSIS — G8929 Other chronic pain: Secondary | ICD-10-CM | POA: Diagnosis present

## 2018-10-10 NOTE — Therapy (Signed)
[facility] [address] [place], [place], [address] Phone: [phone]   Fax:  [phone]  Physical Therapy Treatment  Patient Details  Name: Katherine Atkins MRN: [id number] Date of Birth: Jul 15, 1940 Referring Provider (PT): Dr Doneen Poissonhristopher Blackman    Encounter Date: 10/10/2018  PT End of Session - 10/10/18 0720    Visit Number  4    Number of Visits  6    Date for PT Re-Evaluation  10/30/18    PT Start Time  0716    PT Stop Time  0759    PT Time Calculation (min)  43 min       Past Medical History:  Diagnosis Date  . Blood dyscrasia   . Cataract   . Fever 08/27/2013  . Heart murmur   . Osteoporosis     Past Surgical History:  Procedure Laterality Date  . CATARACT EXTRACTION  2013  . EYE SURGERY      There were no vitals filed for this visit.  Subjective Assessment - 10/10/18 0718    Subjective  I am doing something that is making the knee hurt.    Currently in Pain?  Yes    Pain Score  3     Pain Location  Knee    Pain Orientation  Left    Pain Descriptors / Indicators  Aching    Pain Type  Chronic pain    Aggravating Factors   more constant    Pain Relieving Factors  resting                       [facility] Adult PT Treatment/Exercise - 10/10/18 0001      Knee/Hip Exercises: Stretches   Active Hamstring Stretch  Left;3 reps;30 seconds    Active Hamstring Stretch Limitations  strap    Hip Flexor Stretch  60 seconds    Hip Flexor Stretch Limitations  1 rep     ITB Stretch  3 reps;30 seconds    ITB Stretch Limitations  supine with strap     Gastroc Stretch Limitations  3 x 30 sec each     Other Knee/Hip Stretches  thomas stretch 3x30 sec hold       Knee/Hip Exercises: Aerobic   Recumbent Bike  L1 x 5 minutes       Knee/Hip Exercises: Standing   Heel Raises Limitations  x20    Other Standing Knee Exercises  3 way hip bilateral x 10       Knee/Hip Exercises: Supine   Bridges Limitations  1  x 10     Bridges with Harley-DavidsonBall Squeeze  10 reps    Straight Leg Raises Limitations  2 x10     Other Supine Knee/Hip Exercises  supine clam shell green 2x10       Knee/Hip Exercises: Sidelying   Hip ABduction  10 reps      Manual Therapy   Soft tissue mobilization  Manual to lateral thigh and knee , patella mobs             PT Education - 10/10/18 0831    Education Details  HEP    Person(s) Educated  Patient    Methods  Explanation;Handout    Comprehension  Verbalized understanding       PT Short Term Goals - 10/05/18 1517      PT SHORT TERM GOAL #1   Title  Patient will increase bilateral LE strength to 5/5  Time  3    Period  Weeks    Status  On-going      PT SHORT TERM GOAL #2   Title  Patient will report no pain with active right knee motion    Period  Weeks    Status  On-going    Target Date  10/09/18      PT SHORT TERM GOAL #3   Title  Patient will be independent with intial HEP    Time  3    Period  Weeks    Status  On-going    Target Date  10/09/18        PT Long Term Goals - 09/18/18 1056      PT LONG TERM GOAL #1   Title  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 She is unsure of cause, maybe the new heel lift because she did not take one level off before using it. Reviewed all HEP and began stretching calf. Manual performed to patella,quads and ITB to decrease pain.    PT Next Visit Plan  review  strengthening exercises; add nu-step; standing exercises; side lying hip abduction, consider SAQ; hamstring and IT band stretch; consdier manual therapy to IT band. Patient also has a pool.    PT Home Exercise Plan  bridging, SLR, hip abduction, supine h/s and ITB stretch with strap, standing 3 way hip, heel raises, bridge with ball squeeze       Patient will benefit from skilled therapeutic intervention in order to improve the following deficits and impairments:  Abnormal gait, Decreased mobility, Increased muscle spasms, Improper body mechanics, Decreased activity tolerance, Decreased strength, Pain, Increased fascial restricitons, Decreased range of motion  Visit Diagnosis: 1. Chronic pain of left knee   2. Other abnormalities of gait and mobility        Problem List Patient Active Problem List   Diagnosis Date Noted  . Rash and nonspecific skin eruption 08/28/2013  . Fever, unspecified 08/27/2013  . Pancytopenia ([place]) 08/27/2013  . Elevated liver function tests 08/27/2013  . Leukopenia 08/25/2013  . HEMORRHOIDS, INTERNAL 01/05/2010  . FECAL INCONTINENCE 01/05/2010    Dorene Ar, PTA 10/10/2018, 8:45 AM  [facility] [address] [place], [place], [address] Phone: [phone]   Fax:  [phone]  Name: Katherine Atkins MRN: [id number] Date of Birth: 1940-06-03

## 2018-10-17 ENCOUNTER — Other Ambulatory Visit: Payer: Self-pay

## 2018-10-17 ENCOUNTER — Ambulatory Visit: Payer: Medicare Other | Admitting: Physical Therapy

## 2018-10-17 ENCOUNTER — Encounter: Payer: Self-pay | Admitting: Physical Therapy

## 2018-10-17 DIAGNOSIS — G8929 Other chronic pain: Secondary | ICD-10-CM

## 2018-10-17 DIAGNOSIS — M25562 Pain in left knee: Secondary | ICD-10-CM | POA: Diagnosis not present

## 2018-10-17 DIAGNOSIS — R2689 Other abnormalities of gait and mobility: Secondary | ICD-10-CM

## 2018-10-17 NOTE — Therapy (Addendum)
[place], [place], [address] Phone: [phone]   Fax:  [phone]  Physical Therapy Treatment  Patient Details  Name: Katherine Atkins MRN: [id number] Date of Birth: [birth year] Referring Provider (PT): Dr Jean Rosenthal    Encounter Date: 10/17/2018  PT End of Session - 10/17/18 0719    Visit Number  5    Number of Visits  6    Date for PT Re-Evaluation  10/30/18    PT Start Time  0715    PT Stop Time  0800    PT Time Calculation (min)  45 min       Past Medical History:  Diagnosis Date  . Blood dyscrasia   . Cataract   . Fever 08/27/2013  . Heart murmur   . Osteoporosis     Past Surgical History:  Procedure Laterality Date  . CATARACT EXTRACTION  2013  . EYE SURGERY      There were no vitals filed for this visit.  Subjective Assessment - 10/17/18 0717    Subjective  Pain is sporadic, sometimes hurts all day and sometimes does not hurt at all. This morning the knee is bothering me.    Currently in Pain?  Yes    Pain Score  4     Pain Location  Knee    Pain Orientation  Left    Pain Descriptors / Indicators  Aching    Aggravating Factors   unsure, hurts at night, with more pressure on left leg    Pain Relieving Factors  rest, unsure                       [facility] Adult PT Treatment/Exercise - 10/17/18 0001      Knee/Hip Exercises: Stretches   Gastroc Stretch Limitations  slant board x 1 min      Knee/Hip Exercises: Aerobic   Recumbent Bike  L2 x 5 minutes       Knee/Hip Exercises: Standing   Heel Raises Limitations  x20    Other Standing Knee Exercises  SLS and tandem     Other Standing Knee Exercises  3 way hip bilateral x 10    1 finger touch for proprioception     Knee/Hip Exercises: Seated   Sit to Sand  10 reps   needs Vc for technique, control     Knee/Hip Exercises: Supine   Short Arc Quad Sets  20 reps    Short Arc Quad Sets Limitations  3lbs      Knee/Hip Exercises: Sidelying   Hip ABduction  20 reps             PT Education - 10/17/18 0947    Education Details  HEP    Person(s) Educated  Patient    Methods  Explanation;Handout    Comprehension  Verbalized understanding       PT Short Term Goals - 10/17/18 0816      PT SHORT TERM GOAL #1   Title  Patient will increase bilateral LE strength to 5/5    Status  Unable to assess      PT SHORT TERM GOAL #2   Title  Patient will report no pain with active right knee motion    Status  Unable to assess      PT SHORT TERM GOAL #3   Title  Patient will be independent with intial HEP    Status  Achieved  PT Long Term Goals - 09/18/18 1056      PT LONG TERM GOAL #1   Title  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 Focused balance today and increased weight bearing exercises. Updated HEP. Reprinted previous HEP. Some pain with sit to stands, otherwise okay with todays session. STG# 3 met.    PT Next Visit Plan  assess response to more closed chain and balance this session, begin step ups, check goals    PT Home Exercise Plan  bridging, SLR, hip abduction, supine h/s and ITB stretch with strap, standing 3 way hip, heel raises, bridge with ball squeeze, SLS, tandem stance, tandem gait, side hip abduction , sit- stand       Patient will benefit from skilled therapeutic intervention in order to improve the following deficits and impairments:   Abnormal gait, Decreased mobility, Increased muscle spasms, Improper body mechanics, Decreased activity tolerance, Decreased strength, Pain, Increased fascial restricitons, Decreased range of motion  Visit Diagnosis: 1. Chronic pain of left knee   2. Other abnormalities of gait and mobility        Problem List Patient Active Problem List   Diagnosis Date Noted  . Rash and nonspecific skin eruption 08/28/2013  . Fever, unspecified 08/27/2013  . Pancytopenia ([place]) 08/27/2013  . Elevated liver function tests 08/27/2013  . Leukopenia 08/25/2013  . HEMORRHOIDS, INTERNAL 01/05/2010  . FECAL INCONTINENCE 01/05/2010    Dorene Ar, PTA 10/17/2018, 9:47 AM  [place], [place], [address] Phone: [phone]   Fax:  [phone]  Name: Katherine Atkins MRN: [id number] Date of Birth: 1940/09/06

## 2018-10-24 ENCOUNTER — Ambulatory Visit: Payer: Medicare Other | Admitting: Physical Therapy

## 2018-10-24 ENCOUNTER — Other Ambulatory Visit: Payer: Self-pay

## 2018-10-24 ENCOUNTER — Encounter: Payer: Self-pay | Admitting: Physical Therapy

## 2018-10-24 DIAGNOSIS — G8929 Other chronic pain: Secondary | ICD-10-CM

## 2018-10-24 DIAGNOSIS — R2689 Other abnormalities of gait and mobility: Secondary | ICD-10-CM

## 2018-10-24 DIAGNOSIS — M25562 Pain in left knee: Secondary | ICD-10-CM | POA: Diagnosis not present

## 2018-10-24 NOTE — Therapy (Signed)
[place], [place], [address] Phone: [phone]   Fax:  [phone]  Physical Therapy Treatment  Patient Details  Name: Katherine Atkins MRN: [id number] Date of Birth: 01-21-1941 Referring Provider (PT): Dr Jean Rosenthal    Encounter Date: 10/24/2018  PT End of Session - 10/24/18 0844    Visit Number  6    Number of Visits  6    Date for PT Re-Evaluation  10/30/18    Authorization Type  [employer] medicaire 10 visit progress note 15 visit Kx modifier    PT Start Time  0845    PT Stop Time  0930    PT Time Calculation (min)  45 min       Past Medical History:  Diagnosis Date  . Blood dyscrasia   . Cataract   . Fever 08/27/2013  . Heart murmur   . Osteoporosis     Past Surgical History:  Procedure Laterality Date  . CATARACT EXTRACTION  2013  . EYE SURGERY      There were no vitals filed for this visit.  Subjective Assessment - 10/24/18 0849    Subjective  I am struggling with the tandem exercises.    Currently in Pain?  Yes    Pain Score  3     Pain Location  Knee    Pain Orientation  Left    Pain Descriptors / Indicators  Aching         [facility] PT Assessment - 10/24/18 0001      Strength   Right Hip Flexion  4/5    Left Hip Flexion  4/5    Right/Left Knee  Left    Left Knee Flexion  4+/5                   [facility] Adult PT Treatment/Exercise - 10/24/18 0001      Knee/Hip Exercises: Aerobic   Recumbent Bike  L2 x 5 minutes       Knee/Hip Exercises: Standing   Lateral Step Up  1 set;10 reps;Hand Hold: 1;Step Height: 4"    Forward Step Up  1 set;10 reps;Step Height: 6"    Gait Training  uses momentum and UEs to ascend stairs, poor eccentric control on descent     Other Standing Knee Exercises  Tandem stance good, tandem gait needs intermittent touch, SLS left 21 seconds       Knee/Hip Exercises: Seated   Long Arc Quad  20 reps    Long Arc Quad Weight  7 lbs.    Long  Arc Quad Limitations  10 sec hold and slow lower      Knee/Hip Exercises: Supine   Short Arc [employer]  20 reps    Short Arc Quad Sets Limitations  7lbs             PT Education - 10/24/18 [phone]    Education Details  HEP    Person(s) Educated  Patient    Methods  Explanation;Handout    Comprehension  Verbalized understanding       PT Short Term Goals - 10/24/18 0923      PT SHORT TERM GOAL #1   Title  Patient will increase bilateral LE strength to 5/5    Baseline  baseline 4/5    Time  3    Period  Weeks    Status  On-going      PT SHORT TERM GOAL #2   Title  Patient  will report no pain with active right knee motion    Time  3    Period  Weeks    Status  Achieved      PT SHORT TERM GOAL #3   Title  Patient will be independent with intial HEP    Time  3    Period  Weeks    Status  Achieved        PT Long Term Goals - 09/18/18 1056      PT LONG TERM GOAL #1   Title  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 SLS improved. Still with comensations and momentum required to step up on 6 inch step as well as decreased eccentric control to descend the stairs. Focused resisted open chain and closed chain quad strength with emphasis on control, updated HEP. Pt will have ERO next visit for extension of POC. STG# 2, #3 met.    PT Next Visit Plan  ERO, needs quad strength, assess response to more closed chain and balance this session, begin  step ups, check goals    PT Home Exercise Plan  bridging, SLR, hip abduction, supine h/s and ITB stretch with strap, standing 3 way hip, heel raises, bridge with ball squeeze, SLS, tandem stance, tandem gait, side hip abduction , sit- stand with LLE back, LAQ eith weight and eccentric focus, step up.       Patient will benefit from skilled therapeutic intervention in order to improve the following deficits and impairments:  Abnormal gait, Decreased mobility, Increased muscle spasms, Improper body mechanics, Decreased activity tolerance, Decreased strength, Pain, Increased fascial restricitons, Decreased range of motion  Visit Diagnosis: 1. Chronic pain of left knee   2. Other abnormalities of gait and mobility        Problem List Patient Active Problem List   Diagnosis Date Noted  . Rash and nonspecific skin eruption 08/28/2013  . Fever, unspecified 08/27/2013  . Pancytopenia ([facility]) 08/27/2013  . Elevated liver function tests 08/27/2013  . Leukopenia 08/25/2013  . HEMORRHOIDS, INTERNAL 01/05/2010  . FECAL INCONTINENCE 01/05/2010    Dorene Ar, PTA 10/24/2018, 9:36 AM  [facility] [address] [place], [place], [address] Phone: [phone]   Fax:  [phone]  Name: Katherine Atkins MRN: [id number] Date of Birth: 05/26/1940

## 2018-11-02 ENCOUNTER — Ambulatory Visit: Payer: Medicare Other | Admitting: Physical Therapy

## 2018-11-02 ENCOUNTER — Encounter: Payer: Self-pay | Admitting: Physical Therapy

## 2018-11-02 ENCOUNTER — Other Ambulatory Visit: Payer: Self-pay

## 2018-11-02 DIAGNOSIS — M25562 Pain in left knee: Secondary | ICD-10-CM | POA: Diagnosis not present

## 2018-11-02 DIAGNOSIS — R2689 Other abnormalities of gait and mobility: Secondary | ICD-10-CM

## 2018-11-02 DIAGNOSIS — G8929 Other chronic pain: Secondary | ICD-10-CM

## 2018-11-02 NOTE — Therapy (Signed)
[facility] [address] [place], [place], [address] Phone: [phone]   Fax:  [phone]  Physical Therapy Treatment/Re-cert   Patient Details  Name: Katherine Atkins MRN: [id number] Date of Birth: 1940/08/22 Referring Provider (PT): Dr Doneen Poisson    Encounter Date: 11/02/2018  PT End of Session - 11/02/18 0850    Visit Number  7    Number of Visits  11    Date for PT Re-Evaluation  12/07/18    Authorization Type  [employer] medicaire 10 visit progress note 15 visit Kx modifier Progress Note done on visit 7    PT Start Time  0845    PT Stop Time  0926    PT Time Calculation (min)  41 min    Activity Tolerance  Patient tolerated treatment well    Behavior During Therapy  [facility] for tasks assessed/performed       Past Medical History:  Diagnosis Date  . Blood dyscrasia   . Cataract   . Fever 08/27/2013  . Heart murmur   . Osteoporosis     Past Surgical History:  Procedure Laterality Date  . CATARACT EXTRACTION  2013  . EYE SURGERY      There were no vitals filed for this visit.  Subjective Assessment - 11/02/18 0848    Subjective  Patient feels like she has improved but she is still haviung difficulty transfering from sit to stand. She feels like her ability to go up and down stairs is progressing.    Pertinent History  right hip discomfort 2018    Limitations  Standing;Walking    How long can you sit comfortably?  No limit    How long can you stand comfortably?  No limit    How long can you walk comfortably?  pain after long distances    Diagnostic tests  X-ray: No significant degeneration or joint space loss    Currently in Pain?  Yes    Pain Score  2     Pain Location  Knee    Pain Orientation  Left    Pain Descriptors / Indicators  Aching    Pain Type  Chronic pain    Pain Onset  More than a month ago    Pain Frequency  Constant    Aggravating Factors   hurts with more activity    Pain Relieving Factors   rest    Effect of Pain on Daily Activities  pain with increased fucntional activity         [facility] PT Assessment - 11/02/18 0001      Strength   Right Hip Flexion  5/5    Left Hip Flexion  5/5    Left Knee Flexion  5/5    Left Knee Extension  5/5                   [facility] Adult PT Treatment/Exercise - 11/02/18 0001      Knee/Hip Exercises: Aerobic   Recumbent Bike  L2 x 5 minutes       Knee/Hip Exercises: Standing   Heel Raises Limitations  x20    Lateral Step Up  1 set;10 reps;Hand Hold: 1;Step Height: 4"    Forward Step Up  10 reps;Step Height: 6";2 sets    Other Standing Knee Exercises  Tandem stance good, tandem gait needs intermittent touch, SLS left 21 seconds     Other Standing Knee Exercises  step onto air-ex mat with UE support 2x10  Knee/Hip Exercises: Seated   Long Arc Quad  20 reps    Long Arc Quad Weight  7 lbs.      Knee/Hip Exercises: Supine   Short Arc [employer] Sets  20 reps    Short Arc Quad Sets Limitations  7lbs             PT Education - 11/02/18 [phone]    Education Details  reviewed progression of POC    Person(s) Educated  Patient    Methods  Explanation;Demonstration;Tactile cues;Verbal cues    Comprehension  Verbalized understanding;Returned demonstration;Verbal cues required;Tactile cues required       PT Short Term Goals - 11/02/18 1310      PT SHORT TERM GOAL #1   Title  Patient will increase bilateral LE strength to 5/5    Baseline  5/5    Time  3    Period  Weeks    Status  On-going      PT SHORT TERM GOAL #2   Title  Patient will report no pain with active right knee motion    Baseline  no pain at htis time    Time  3    Period  Weeks    Status  Achieved      PT SHORT TERM GOAL #3   Title  Patient will be independent with intial HEP    Baseline  perfroming at home    Time  3    Period  Weeks    Status  Achieved        PT Long Term Goals - 11/02/18 1311      PT LONG TERM GOAL #1   Title  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 She feels imporvement in pain and fucntional activity but she feels like she could use a few more visits to reach her max potential. She has imporved strength. She tolerated ther-ex well today. See below for goal specific progress.Therapy will focus on sit to stand transfers and steps.    Personal Factors and Comorbidities  Comorbidity 1    Comorbidities  osteoperosis    Examination-Activity Limitations  Stairs;Locomotion Level    Examination-Participation Restrictions  Community Activity;Yard Work    Stability/Clinical Decision Making  Stable/Uncomplicated    OptometristClinical Decision Making  Low    Rehab Potential  Excellent    PT Frequency  1x / week    PT Duration  6 weeks    PT Treatment/Interventions  ADLs/Self Care Home Management;Electrical Stimulation;Iontophoresis 4mg /ml Dexamethasone;Moist Heat;Ultrasound;Gait training;Stair training;Functional mobility training;Therapeutic activities;Therapeutic exercise;Patient/family education;Manual techniques;Neuromuscular re-education;Passive range of motion;Taping  PT Next Visit Plan  continue with funtional training    PT Home Exercise Plan  bridging, SLR, hip abduction, supine h/s and ITB stretch with strap, standing 3 way hip, heel raises, bridge with ball squeeze, SLS, tandem stance, tandem gait, side hip abduction , sit- stand with LLE back, LAQ eith weight and eccentric  focus, step up.    Consulted and Agree with Plan of Care  Patient       Patient will benefit from skilled therapeutic intervention in order to improve the following deficits and impairments:  Abnormal gait, Decreased mobility, Increased muscle spasms, Improper body mechanics, Decreased activity tolerance, Decreased strength, Pain, Increased fascial restricitons, Decreased range of motion  Visit Diagnosis: Chronic pain of left knee  Other abnormalities of gait and mobility     Problem List Patient Active Problem List   Diagnosis Date Noted  . Rash and nonspecific skin eruption 08/28/2013  . Fever, unspecified 08/27/2013  . Pancytopenia ([facility]) 08/27/2013  . Elevated liver function tests 08/27/2013  . Leukopenia 08/25/2013  . HEMORRHOIDS, INTERNAL 01/05/2010  . FECAL INCONTINENCE 01/05/2010    Carney Living PT DPT  11/02/2018, 1:13 PM  [facility] [address] [place], [place], [address] Phone: [phone]   Fax:  [phone]  Name: Laycie Schriner MRN: [id number] Date of Birth: 10-03-1940

## 2018-11-08 ENCOUNTER — Other Ambulatory Visit: Payer: Self-pay

## 2018-11-08 ENCOUNTER — Ambulatory Visit: Payer: Medicare Other | Attending: Orthopaedic Surgery | Admitting: Physical Therapy

## 2018-11-08 ENCOUNTER — Encounter: Payer: Self-pay | Admitting: Physical Therapy

## 2018-11-08 DIAGNOSIS — R2689 Other abnormalities of gait and mobility: Secondary | ICD-10-CM | POA: Diagnosis present

## 2018-11-08 DIAGNOSIS — M25562 Pain in left knee: Secondary | ICD-10-CM | POA: Diagnosis present

## 2018-11-08 DIAGNOSIS — G8929 Other chronic pain: Secondary | ICD-10-CM | POA: Diagnosis present

## 2018-11-08 NOTE — Therapy (Signed)
[place], [place], [address] Phone: [phone]   Fax:  [phone]  Physical Therapy Treatment  Patient Details  Name: Katherine Atkins MRN: [id number] Date of Birth: November 27, 1940 Referring Provider (PT): Dr Jean Rosenthal    Encounter Date: 11/08/2018  PT End of Session - 11/08/18 1338    Visit Number  8    Number of Visits  11    Date for PT Re-Evaluation  12/07/18    Authorization Type  [employer] medicaire 10 visit progress note 15 visit Kx modifier Progress Note done on visit 7    PT Start Time  0845    PT Stop Time  0925    PT Time Calculation (min)  40 min    Activity Tolerance  Patient tolerated treatment well    Behavior During Therapy  [facility] for tasks assessed/performed       Past Medical History:  Diagnosis Date  . Blood dyscrasia   . Cataract   . Fever 08/27/2013  . Heart murmur   . Osteoporosis     Past Surgical History:  Procedure Laterality Date  . CATARACT EXTRACTION  2013  . EYE SURGERY      There were no vitals filed for this visit.  Subjective Assessment - 11/08/18 0852    Subjective  Patient reports no pain unless she is transfering or going up/down steps. She has been practiving steps on a 4 inch book.    Pertinent History  right hip discomfort 2018    Limitations  Standing;Walking    How long can you sit comfortably?  No limit    How long can you stand comfortably?  No limit    How long can you walk comfortably?  pain after long distances    Diagnostic tests  X-ray: No significant degeneration or joint space loss    Patient Stated Goals  to have no pain/ improve her gait    Currently in Pain?  No/denies   only when transfering from sit to stand                      [facility] Adult PT Treatment/Exercise - 11/08/18 0001      Knee/Hip Exercises: Stretches   Active Hamstring Stretch  Left;30 seconds;2 reps    Active Hamstring Stretch Limitations  strap      Knee/Hip Exercises: Aerobic   Recumbent Bike  L4x 5 minutes       Knee/Hip Exercises: Standing   Heel Raises Limitations  x20    Lateral Step Up  1 set;10 reps;Hand Hold: 1;Step Height: 4"    Forward Step Up  10 reps;Step Height: 6";2 sets    Other Standing Knee Exercises  Tandem stance good, tandem gait needs intermittent touch, SLS left 21 seconds     Other Standing Knee Exercises  significant time spent reviewing the hip hinge. 2x10. Advised not to over-do at home. Needs min/mod cuing for proper hip hinge.       Knee/Hip Exercises: Seated   Long Arc Quad  20 reps    Long Arc Quad Weight  7 lbs.    Sit to Sand  10 reps      Knee/Hip Exercises: Supine   Bridges Limitations  2x10    Straight Leg Raises Limitations  2 x10              PT Education - 11/08/18 1338    Education Details  reviewed HEp and symptom manegement  Person(s) Educated  Patient    Methods  Explanation;Demonstration;Verbal cues;Tactile cues    Comprehension  Verbalized understanding;Returned demonstration;Verbal cues required;Tactile cues required       PT Short Term Goals - 11/02/18 1310      PT SHORT TERM GOAL #1   Title  Patient will increase bilateral LE strength to 5/5    Baseline  5/5    Time  3    Period  Weeks    Status  On-going      PT SHORT TERM GOAL #2   Title  Patient will report no pain with active right knee motion    Baseline  no pain at htis time    Time  3    Period  Weeks    Status  Achieved      PT SHORT TERM GOAL #3   Title  Patient will be independent with intial HEP    Baseline  perfroming at home    Time  3    Period  Weeks    Status  Achieved        PT Long Term Goals - 11/02/18 1311      PT LONG TERM GOAL #1   Title  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 She required mod cuing for each but was able to complete each with minor cuing. She was advised to not over-do these activityies at home.    Personal Factors and Comorbidities  Comorbidity 1    Examination-Activity Limitations  Stairs;Locomotion Level    Examination-Participation Restrictions  Community Activity;Yard Work    Stability/Clinical Decision Making  Stable/Uncomplicated    Optometrist  Low    Rehab Potential  Excellent    PT Frequency  1x / week    PT Duration  6 weeks    PT Treatment/Interventions  ADLs/Self Care Home Management;Electrical Stimulation;Iontophoresis 4mg /ml Dexamethasone;Moist Heat;Ultrasound;Gait training;Stair training;Functional mobility training;Therapeutic activities;Therapeutic exercise;Patient/family education;Manual techniques;Neuromuscular re-education;Passive range of motion;Taping    PT Next Visit Plan  continue with funtional training    PT Home Exercise Plan  bridging, SLR, hip abduction, supine h/s and ITB stretch with strap, standing 3 way hip, heel raises, bridge with ball squeeze, SLS, tandem stance, tandem gait, side hip abduction , sit- stand with LLE back, LAQ eith weight and eccentric focus, step up.    Consulted and Agree with Plan of Care  Patient       Patient will benefit from skilled therapeutic intervention in order to improve the following deficits and impairments:  Abnormal gait,  Decreased mobility, Increased muscle spasms, Improper body mechanics, Decreased activity tolerance, Decreased strength, Pain, Increased fascial restricitons,  Decreased range of motion  Visit Diagnosis: Chronic pain of left knee  Other abnormalities of gait and mobility     Problem List Patient Active Problem List   Diagnosis Date Noted  . Rash and nonspecific skin eruption 08/28/2013  . Fever, unspecified 08/27/2013  . Pancytopenia ([facility]) 08/27/2013  . Elevated liver function tests 08/27/2013  . Leukopenia 08/25/2013  . HEMORRHOIDS, INTERNAL 01/05/2010  . FECAL INCONTINENCE 01/05/2010    Dessie Comaavid J Onnie Alatorre PT DPT  11/08/2018, 1:43 PM  [facility] [address] [place], [place], [address] Phone: [phone]   Fax:  [phone]  Name: Katherine Atkins MRN: [id number] Date of Birth: Feb 10, 1941

## 2018-11-17 ENCOUNTER — Encounter

## 2018-11-22 ENCOUNTER — Encounter: Payer: Self-pay | Admitting: Physical Therapy

## 2018-11-22 ENCOUNTER — Other Ambulatory Visit: Payer: Self-pay

## 2018-11-22 ENCOUNTER — Ambulatory Visit: Payer: Medicare Other | Admitting: Physical Therapy

## 2018-11-22 DIAGNOSIS — G8929 Other chronic pain: Secondary | ICD-10-CM

## 2018-11-22 DIAGNOSIS — R2689 Other abnormalities of gait and mobility: Secondary | ICD-10-CM

## 2018-11-22 DIAGNOSIS — M25562 Pain in left knee: Secondary | ICD-10-CM

## 2018-11-22 NOTE — Therapy (Addendum)
[facility] [address] [place], [place], [address] Phone: [phone]   Fax:  [phone]  Physical Therapy Treatment  Patient Details  Name: Katherine Atkins MRN: [id number] Date of Birth: [birth year] Referring Provider (PT): Dr Doneen Poissonhristopher Blackman    Encounter Date: 11/22/2018  PT End of Session - 11/22/18 0719    Visit Number  9    Number of Visits  11    Date for PT Re-Evaluation  12/07/18    Authorization Type  [employer] medicaire 10 visit progress note 15 visit Kx modifier Progress Note done on visit 7    PT Start Time  0715    PT Stop Time  0758    PT Time Calculation (min)  43 min       Past Medical History:  Diagnosis Date  . Blood dyscrasia   . Cataract   . Fever 08/27/2013  . Heart murmur   . Osteoporosis     Past Surgical History:  Procedure Laterality Date  . CATARACT EXTRACTION  2013  . EYE SURGERY      There were no vitals filed for this visit.  Subjective Assessment - 11/22/18 0717    Subjective  When I wear my slippers without the heel lift I can tell I more knee pain. Getting up from chairs is not painful if left leg is back.    Currently in Pain?  Yes    Pain Score  2     Pain Location  Knee    Pain Orientation  Left    Pain Descriptors / Indicators  Aching    Aggravating Factors   wearing house slippers    Pain Relieving Factors  rest                       [facility] Adult PT Treatment/Exercise - 11/22/18 0001      Knee/Hip Exercises: Stretches   Active Hamstring Stretch  Left;30 seconds;2 reps    Active Hamstring Stretch Limitations  seated     Gastroc Stretch Limitations  slant board x 1 min      Knee/Hip Exercises: Aerobic   Recumbent Bike  L3 x 5 min      Knee/Hip Exercises: Standing   Lateral Step Up  1 set;10 reps;Hand Hold: 1;Step Height: 4"    Forward Step Up  10 reps;Step Height: 6";2 sets    Step Down  10 reps;Hand Hold: 1;Step Height: 4"    Other Standing Knee  Exercises  tandem and SLS on foam pad today, 20 sec SLS on foam       Knee/Hip Exercises: Seated   Long Arc Quad  20 reps    Long Arc Quad Limitations  green    Hamstring Curl  20 reps    Hamstring Limitations  green     Sit to Sand  20 reps   LLE back     Knee/Hip Exercises: Sidelying   Hip ABduction  20 reps             PT Education - 11/22/18 0831    Education Details  HEP    Person(s) Educated  Patient    Methods  Explanation;Handout    Comprehension  Verbalized understanding       PT Short Term Goals - 11/02/18 1310      PT SHORT TERM GOAL #1   Title  Patient will increase bilateral LE strength to 5/5    Baseline  5/5  Time  3    Period  Weeks    Status  On-going      PT SHORT TERM GOAL #2   Title  Patient will report no pain with active right knee motion    Baseline  no pain at htis time    Time  3    Period  Weeks    Status  Achieved      PT SHORT TERM GOAL #3   Title  Patient will be independent with intial HEP    Baseline  perfroming at home    Time  3    Period  Weeks    Status  Achieved        PT Long Term Goals - 11/02/18 1311      PT LONG TERM GOAL #1   Title  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 Progressed static balance to unlevel surface with good tolerance. Able to SLS on foam 20sec each. Progressed step strengthening to eccentric side  steps and step downs.    PT Next Visit Plan  continue with funtional training    PT Home Exercise Plan  bridging, SLR, hip abduction, supine h/s and ITB stretch with strap, standing 3 way hip, heel raises, bridge with ball squeeze, SLS, tandem stance, tandem gait, side hip abduction , sit- stand with LLE back, LAQ eith weight and eccentric focus, step up. step down, lateral step , SLS and Tandem on Foam pad       Patient will benefit from skilled therapeutic intervention in order to improve the following deficits and impairments:  Abnormal gait, Decreased mobility, Increased muscle spasms, Improper body mechanics, Decreased activity tolerance, Decreased strength, Pain, Increased fascial restricitons, Decreased range of motion  Visit Diagnosis: Chronic pain of left knee  Other abnormalities of gait and mobility     Problem List Patient Active Problem List   Diagnosis Date Noted  . Rash and nonspecific skin eruption 08/28/2013  . Fever, unspecified 08/27/2013  . Pancytopenia ([place]) 08/27/2013  . Elevated liver function tests 08/27/2013  . Leukopenia 08/25/2013  . HEMORRHOIDS, INTERNAL 01/05/2010  . FECAL INCONTINENCE 01/05/2010    Dorene Ar, PTA 11/22/2018, 8:32 AM  [facility] [address] [place], [place], [address] Phone: [phone]   Fax:  [phone]  Name: Katherine Atkins MRN: [id number] Date of Birth: Apr 08, 1940

## 2018-11-29 ENCOUNTER — Encounter: Payer: Self-pay | Admitting: Physical Therapy

## 2018-11-29 ENCOUNTER — Ambulatory Visit: Payer: Medicare Other | Admitting: Physical Therapy

## 2018-11-29 ENCOUNTER — Other Ambulatory Visit: Payer: Self-pay

## 2018-11-29 DIAGNOSIS — M25562 Pain in left knee: Secondary | ICD-10-CM | POA: Diagnosis not present

## 2018-11-29 DIAGNOSIS — R2689 Other abnormalities of gait and mobility: Secondary | ICD-10-CM

## 2018-11-29 DIAGNOSIS — G8929 Other chronic pain: Secondary | ICD-10-CM

## 2018-11-29 NOTE — Therapy (Addendum)
[place], [place], [address] Phone: [phone]   Fax:  [phone]  Physical Therapy Treatment/Discharge   Patient Details  Name: Katherine Atkins MRN: [id number] Date of Birth: 09/21/1940 Referring Provider (PT): Dr Jean Rosenthal    Encounter Date: 11/29/2018  PT End of Session - 11/29/18 0718    Visit Number  10    Number of Visits  11    Date for PT Re-Evaluation  12/07/18    Authorization Type  [employer] medicaire 10 visit progress note 15 visit Kx modifier Progress Note done on visit 7    PT Start Time  0715    PT Stop Time  0759    PT Time Calculation (min)  44 min       Past Medical History:  Diagnosis Date  . Blood dyscrasia   . Cataract   . Fever 08/27/2013  . Heart murmur   . Osteoporosis     Past Surgical History:  Procedure Laterality Date  . CATARACT EXTRACTION  2013  . EYE SURGERY      There were no vitals filed for this visit.      [facility] PT Assessment - 11/29/18 0001      Observation/Other Assessments   Focus on Therapeutic Outcomes (FOTO)   19%      Strength   Right Hip Flexion  5/5    Left Hip Flexion  5/5    Left Knee Flexion  5/5    Left Knee Extension  5/5                   [facility] Adult PT Treatment/Exercise - 11/29/18 0001      Knee/Hip Exercises: Stretches   Active Hamstring Stretch  Left;30 seconds;2 reps    Active Hamstring Stretch Limitations  seated       Knee/Hip Exercises: Aerobic   Nustep  L5 x 5 minutes LE only       Knee/Hip Exercises: Standing   Lateral Step Up  1 set;10 reps;Hand Hold: 1;Step Height: 4"    Forward Step Up  10 reps;Step Height: 6";2 sets    Step Down  10 reps;20 reps;Hand Hold: 1;Step Height: 4";Step Height: 6"    Stairs  up and down 6 inch stairs without UE 4 starirs x 3     Other Standing Knee Exercises  tandem and SLS on foam pad today, 20 sec SLS on foam       Knee/Hip Exercises: Seated   Sit to Sand  20 reps       Knee/Hip Exercises: Sidelying   Hip ABduction  20 reps               PT Short Term Goals - 11/29/18 0749      PT SHORT TERM GOAL #1   Title  Patient will increase bilateral LE strength to 5/5    Baseline  5/5    Time  3    Period  Weeks    Status  Achieved      PT SHORT TERM GOAL #2   Title  Patient will report no pain with active right knee motion    Time  3    Period  Weeks    Status  Achieved      PT SHORT TERM GOAL #3   Title  Patient will be independent with intial HEP    Time  3    Period  Weeks    Status  Achieved  PT Long Term Goals - 11/29/18 0749      PT LONG TERM GOAL #1   Title  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 Her strength and ROM are full. She has improved her FOTO score to 19 %limited (29% predicted). She is able to descend the stairs with control and no inreased pain which is a significant improvement since beginning PT. She is pleased with her progress and all LTGS met. She is agreeable to DC to HEP.    PT Next Visit Plan  discharge to HEP    PT Home Exercise Plan  bridging, SLR, hip abduction, supine h/s and ITB stretch with strap, standing 3 way hip, heel raises, bridge with ball squeeze, SLS, tandem stance, tandem gait, side hip  abduction , sit- stand with LLE back, LAQ eith weight and eccentric focus, step up. step down, lateral step , SLS and Tandem on Foam pad       Patient will benefit from skilled therapeutic intervention in order to improve the following deficits and impairments:  Abnormal gait, Decreased mobility, Increased muscle spasms, Improper body mechanics, Decreased activity tolerance, Decreased strength, Pain, Increased fascial restricitons, Decreased range of motion  Visit Diagnosis: Chronic pain of left knee  Other abnormalities of gait and mobility  PHYSICAL THERAPY DISCHARGE SUMMARY  Visits from Start of Care: 10  Current functional level related to goals / functional outcomes: improved ability to stand and walk    Remaining deficits:  pain at times    Education / Equipment: HEP   Plan: Patient agrees to discharge.  Patient goals were met. Patient is being discharged due to being pleased with the current functional level.  ?????       Problem List Patient Active Problem List   Diagnosis Date Noted  . Rash and nonspecific skin eruption 08/28/2013  . Fever, unspecified 08/27/2013  . Pancytopenia ([place]) 08/27/2013  . Elevated liver function tests 08/27/2013  . Leukopenia 08/25/2013  . HEMORRHOIDS, INTERNAL 01/05/2010  . FECAL INCONTINENCE 01/05/2010   Carolyne Littles PT DPT  01/08/2019    Dorene Ar, PTA 11/29/2018, 8:08 AM  [place], [place], [address] Phone: [phone]   Fax:  [phone]  Name: Katherine Atkins MRN: [id number] Date of Birth: [birth year]

## 2020-02-25 ENCOUNTER — Ambulatory Visit (INDEPENDENT_AMBULATORY_CARE_PROVIDER_SITE_OTHER): Payer: Medicare Other | Admitting: Ophthalmology

## 2020-02-25 ENCOUNTER — Encounter (INDEPENDENT_AMBULATORY_CARE_PROVIDER_SITE_OTHER): Payer: Self-pay | Admitting: Ophthalmology

## 2020-02-25 ENCOUNTER — Other Ambulatory Visit: Payer: Self-pay

## 2020-02-25 DIAGNOSIS — H43813 Vitreous degeneration, bilateral: Secondary | ICD-10-CM | POA: Diagnosis not present

## 2020-02-25 DIAGNOSIS — H353132 Nonexudative age-related macular degeneration, bilateral, intermediate dry stage: Secondary | ICD-10-CM

## 2020-02-25 NOTE — Assessment & Plan Note (Signed)

## 2020-02-25 NOTE — Progress Notes (Signed)
02/25/2020     CHIEF COMPLAINT Patient presents for Retina Follow Up (1 YR FU OU////Pt reports change in vision especially watching TV, no new f/f, no pain or pressure. )   HISTORY OF PRESENT ILLNESS: Katherine Atkins is a 79 y.o. female who presents to the clinic today for:   HPI    Retina Follow Up    Patient presents with  Dry AMD.  In both eyes.  This started 1 year ago.  Duration of 1 year.  Since onset it is gradually worsening. Additional comments: 1 YR FU OU    Pt reports change in vision especially watching TV, no new f/f, no pain or pressure.        Last edited by Varney Biles D on 02/25/2020  8:07 AM. (History)      Referring physician: Geoffry Paradise, MD [address] [place],  [place] [address]  HISTORICAL INFORMATION:   Selected notes from the MEDICAL RECORD NUMBER       CURRENT MEDICATIONS: No current outpatient medications on file. (Ophthalmic Drugs)   No current facility-administered medications for this visit. (Ophthalmic Drugs)   Current Outpatient Medications (Other)  Medication Sig  . acetaminophen (TYLENOL) 325 MG tablet Take 2 tablets (650 mg total) by mouth every 6 (six) hours as needed for mild pain (or Fever >/= 101). (Patient not taking: Reported on 08/24/2016)  . beta carotene w/minerals (OCUVITE) tablet Take 1 tablet by mouth daily.  . Calcium Carb-Cholecalciferol (CALCIUM 600/VITAMIN D3) 600-800 MG-UNIT TABS Take 1 tablet by mouth daily.  . Garlic (GARLIQUE PO) Take 1 tablet by mouth every morning.  . Omega-3 Fatty Acids (FISH OIL) 600 MG CAPS Take 1 capsule by mouth daily at 12 noon.   No current facility-administered medications for this visit. (Other)      REVIEW OF SYSTEMS:    ALLERGIES No Known Allergies  PAST MEDICAL HISTORY Past Medical History:  Diagnosis Date  . Blood dyscrasia   . Cataract   . Fever 08/27/2013  . Heart murmur   . Osteoporosis    Past Surgical History:  Procedure Laterality Date  . CATARACT  EXTRACTION  2013  . EYE SURGERY      FAMILY HISTORY Family History  Problem Relation Age of Onset  . Kidney disease Father   . Heart disease Maternal Grandfather   . Heart disease Paternal Grandmother     SOCIAL HISTORY Social History   Tobacco Use  . Smoking status: Never Smoker  . Smokeless tobacco: Never Used  Substance Use Topics  . Alcohol use: No  . Drug use: No         OPHTHALMIC EXAM:  Base Eye Exam    Visual Acuity (ETDRS)      Right Left   Dist [place] 20/25 -2 20/50 -2   Dist ph [place]  20/20 -1       Tonometry (Tonopen, 8:12 AM)      Right Left   Pressure 18 16       Pupils      Dark Light Shape React APD   Right 4 3 Round Brisk None   Left 4 3 Round Brisk None       Visual Fields (Counting fingers)      Left Right    Full Full       Extraocular Movement      Right Left    Full Full       Neuro/Psych    Oriented x3: Yes  Dilation    Both eyes: 1.0% Mydriacyl, 2.5% Phenylephrine @ 8:12 AM        Slit Lamp and Fundus Exam    External Exam      Right Left   External Normal Normal       Slit Lamp Exam      Right Left   Lids/Lashes Normal Normal   Conjunctiva/Sclera White and quiet White and quiet   Cornea Clear Clear   Anterior Chamber Deep and quiet Deep and quiet   Iris Round and reactive Round and reactive   Lens Centered posterior chamber intraocular lens Centered posterior chamber intraocular lens   Anterior Vitreous Normal Normal       Fundus Exam      Right Left   Posterior Vitreous Posterior vitreous detachment Posterior vitreous detachment   Disc Normal Normal   C/D Ratio 0.65 0.65   Macula Intermediate age related macular degeneration, no macular thickening, no hemorrhage, no exudates Intermediate age related macular degeneration, no macular thickening, no hemorrhage, no exudates   Vessels Normal Normal   Periphery Normal Normal          IMAGING AND PROCEDURES  Imaging and Procedures for 02/25/20  OCT,  Retina - OU - Both Eyes       Right Eye Quality was good. Scan locations included subfoveal. Central Foveal Thickness: 316. Progression has been stable. Findings include no IRF, retinal drusen , no SRF, pigment epithelial detachment.   Left Eye Quality was good. Scan locations included subfoveal. Central Foveal Thickness: 302. Progression has been stable. Findings include no SRF, no IRF, pigment epithelial detachment.   Notes Subfoveal drusenoid pigment epithelial detachments, no signs of active CNVM                ASSESSMENT/PLAN:  Intermediate stage nonexudative age-related macular degeneration of both eyes The nature of age--related macular degeneration was discussed with the patient as well as the distinction between dry and wet types. Checking an Amsler Grid daily with advice to return immediately should a distortion develop, was given to the patient. The patient 's smoking status now and in the past was determined and advice based on the AREDS study was provided regarding the consumption of antioxidant supplements. AREDS 2 vitamin formulation was recommended. Consumption of dark leafy vegetables and fresh fruits of various colors was recommended. Treatment modalities for wet macular degeneration particularly the use of intravitreal injections of anti-blood vessel growth factors was discussed with the patient. Avastin, Lucentis, and Eylea are the available options. On occasion, therapy includes the use of photodynamic therapy and thermal laser. Stressed to the patient do not rub eyes.  Patient was advised to check Amsler Grid daily and return immediately if changes are noted. Instructions on using the grid were given to the patient. All patient questions were answered.  Posterior vitreous detachment of both eyes   The nature of posterior vitreous detachment was discussed with the patient as well as its physiology, its age prevalence, and its possible implication regarding retinal  breaks and detachment.  An informational brochure was given to the patient.  All the patient's questions were answered.  The patient was asked to return if new or different flashes or floaters develops.   Patient was instructed to contact office immediately if any changes were noticed. I explained to the patient that vitreous inside the eye is similar to jello inside a bowl. As the jello melts it can start to pull away from the bowl, similarly the vitreous  throughout our lives can begin to pull away from the retina. That process is called a posterior vitreous detachment. In some cases, the vitreous can tug hard enough on the retina to form a retinal tear. I discussed with the patient the signs and symptoms of a retinal detachment.  Do not rub the eye.      ICD-10-CM   1. Intermediate stage nonexudative age-related macular degeneration of both eyes  H35.3132 OCT, Retina - OU - Both Eyes  2. Posterior vitreous detachment of both eyes  H43.813 OCT, Retina - OU - Both Eyes    1.  2.  3.  Ophthalmic Meds Ordered this visit:  No orders of the defined types were placed in this encounter.      Return in about 1 year (around 02/24/2021) for DILATE OU, OCT.  There are no Patient Instructions on file for this visit.   Explained the diagnoses, plan, and follow up with the patient and they expressed understanding.  Patient expressed understanding of the importance of proper follow up care.   Alford [facility] [employer] M.D. Diseases & Surgery of the Retina and Vitreous [facility] 02/25/20     Abbreviations: M myopia (nearsighted); A astigmatism; H hyperopia (farsighted); P presbyopia; Mrx spectacle prescription;  CTL contact lenses; OD right eye; OS left eye; OU both eyes  XT exotropia; ET esotropia; PEK punctate epithelial keratitis; PEE punctate epithelial erosions; DES dry eye syndrome; MGD meibomian gland dysfunction; ATs artificial tears; PFAT's preservative free artificial tears;  NSC nuclear sclerotic cataract; PSC posterior subcapsular cataract; ERM epi-retinal membrane; PVD posterior vitreous detachment; RD retinal detachment; DM diabetes mellitus; DR diabetic retinopathy; NPDR non-proliferative diabetic retinopathy; PDR proliferative diabetic retinopathy; CSME clinically significant macular edema; DME diabetic macular edema; dbh dot blot hemorrhages; CWS cotton wool spot; POAG primary open angle glaucoma; C/D cup-to-disc ratio; HVF humphrey visual field; GVF goldmann visual field; OCT optical coherence tomography; IOP intraocular pressure; BRVO Branch retinal vein occlusion; CRVO central retinal vein occlusion; CRAO central retinal artery occlusion; BRAO branch retinal artery occlusion; RT retinal tear; SB scleral buckle; PPV pars plana vitrectomy; VH Vitreous hemorrhage; PRP panretinal laser photocoagulation; IVK intravitreal kenalog; VMT vitreomacular traction; MH Macular hole;  NVD neovascularization of the disc; NVE neovascularization elsewhere; AREDS age related eye disease study; ARMD age related macular degeneration; POAG primary open angle glaucoma; EBMD epithelial/anterior basement membrane dystrophy; ACIOL anterior chamber intraocular lens; IOL intraocular lens; PCIOL posterior chamber intraocular lens; Phaco/IOL phacoemulsification with intraocular lens placement; PRK photorefractive keratectomy; LASIK laser assisted in situ keratomileusis; HTN hypertension; DM diabetes mellitus; COPD chronic obstructive pulmonary disease

## 2020-02-25 NOTE — Assessment & Plan Note (Signed)

## 2020-09-17 ENCOUNTER — Encounter: Payer: Self-pay | Admitting: Orthopaedic Surgery

## 2020-09-17 ENCOUNTER — Ambulatory Visit: Payer: Medicare Other | Admitting: Orthopaedic Surgery

## 2020-09-17 ENCOUNTER — Other Ambulatory Visit: Payer: Self-pay

## 2020-09-17 ENCOUNTER — Ambulatory Visit: Payer: Self-pay

## 2020-09-17 DIAGNOSIS — S8264XA Nondisplaced fracture of lateral malleolus of right fibula, initial encounter for closed fracture: Secondary | ICD-10-CM | POA: Diagnosis not present

## 2020-09-17 DIAGNOSIS — M25571 Pain in right ankle and joints of right foot: Secondary | ICD-10-CM

## 2020-09-17 MED ORDER — TRAMADOL HCL 50 MG PO TABS: 50.0000 mg | ORAL_TABLET | Freq: Four times a day (QID) | ORAL | 0 refills | Status: DC | PRN
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
## 2020-10-20 ENCOUNTER — Other Ambulatory Visit: Payer: Self-pay

## 2020-10-20 ENCOUNTER — Ambulatory Visit: Payer: Self-pay

## 2020-10-20 ENCOUNTER — Ambulatory Visit: Payer: Medicare Other | Admitting: Orthopaedic Surgery

## 2020-10-20 ENCOUNTER — Encounter: Payer: Self-pay | Admitting: Orthopaedic Surgery

## 2020-10-20 DIAGNOSIS — S8264XD Nondisplaced fracture of lateral malleolus of right fibula, subsequent encounter for closed fracture with routine healing: Secondary | ICD-10-CM | POA: Diagnosis not present

## 2020-10-20 DIAGNOSIS — S8264XA Nondisplaced fracture of lateral malleolus of right fibula, initial encounter for closed fracture: Secondary | ICD-10-CM

## 2020-10-20 NOTE — Progress Notes (Signed)
The patient is a very active 80 year old female who is about 7 weeks into a right ankle nondisplaced lateral malleolus fracture.  She is not wearing an ankle brace at this point and says her discomfort is mostly at night and going downstairs.  She says that during the day she is having no issues.  I did watch her walk and she is not walking with any type of a limp.  There is some residual right ankle swelling and some lateral ankle pain but range of motion is full and swelling is minimal.  NuPrep 3 views of the right ankle show a healing lateral malleolus fracture with no complicating features.  The ankle mortise is congruent.  This point we do not need to really x-rays again since she is doing so well.  She understands that she should give this another least 6 weeks to feel better she can slowly increase her activities as comfort allows.  No therapy is really needed since her range of motion is full and she has good strength.  All questions and concerns were answered and addressed.  If things worsen she will let us know.

## 2020-11-27 ENCOUNTER — Ambulatory Visit (INDEPENDENT_AMBULATORY_CARE_PROVIDER_SITE_OTHER): Payer: Medicare Other | Admitting: Orthopedic Surgery

## 2020-11-27 ENCOUNTER — Other Ambulatory Visit: Payer: Self-pay

## 2020-11-27 ENCOUNTER — Ambulatory Visit: Payer: Self-pay

## 2020-11-27 DIAGNOSIS — M79671 Pain in right foot: Secondary | ICD-10-CM

## 2021-01-04 ENCOUNTER — Encounter: Payer: Self-pay | Admitting: Orthopedic Surgery

## 2021-01-04 NOTE — Progress Notes (Signed)
Office Visit Note   Patient: Katherine Atkins           Date of Birth: Aug 18, 1940           MRN: [id number] Visit Date: 11/27/2020              Requested by: Geoffry Paradise, MD [address] [place],  [place] [address] PCP: Geoffry Paradise, MD  Chief Complaint  Patient presents with   Right Foot - Pain      HPI: Patient is an 80 year old woman who presents complaining of pain with ambulation with the right foot and decrease sleep secondary to pain.  Patient is also status post nondisplaced right ankle lateral malleolar fracture with a congruent mortise.  Assessment & Plan: Visit Diagnoses:  1. Pain in right foot     Plan: Recommended stiff soled walking shoes recommended Achilles stretching.  Follow-Up Instructions: Return if symptoms worsen or fail to improve.   Ortho Exam  Patient is alert, oriented, no adenopathy, well-dressed, normal affect, normal respiratory effort. Examination patient has good range of motion of her ankle she has good pulses no arterial insufficiency there is minimal swelling no ulcers.  There is no tenderness to palpation around the great toe or metatarsal heads.  Imaging: No results found. No images are attached to the encounter.  Labs: Lab Results  Component Value Date   ESRSEDRATE 5 08/25/2013   REPTSTATUS 09/02/2013 FINAL 08/26/2013   CULT  08/26/2013    NO GROWTH 5 DAYS Performed at [facility]   LABORGA STAPHYLOCOCCUS SPECIES (COAGULASE NEGATIVE) 08/25/2013   LABORGA Multiple bacterial morphotypes present, none 08/25/2013   LABORGA predominant. Suggest appropriate recollection if 08/25/2013   LABORGA clinically indicated. 08/25/2013     Lab Results  Component Value Date   ALBUMIN 2.9 (L) 08/28/2013   ALBUMIN 2.6 (L) 08/27/2013   ALBUMIN 2.8 (L) 08/26/2013    No results found for: MG No results found for: VD25OH  No results found for: PREALBUMIN CBC EXTENDED Latest Ref Rng & Units 08/28/2013 08/27/2013 08/26/2013   WBC 4.0 - 10.5 K/uL 5.6 2.9(L) 2.1(L)  RBC 3.87 - 5.11 MIL/uL 3.93 3.75(L) 3.92  HGB 12.0 - 15.0 g/dL 52.7 11.6(L) 11.9(L)  HCT 36.0 - 46.0 % 35.8(L) 33.8(L) 35.5(L)  PLT 150 - 400 K/uL 83(L) 56(L) 58(L)     There is no height or weight on file to calculate BMI.  Orders:  Orders Placed This Encounter  Procedures   XR Foot 2 Views Right   No orders of the defined types were placed in this encounter.    Procedures: No procedures performed  Clinical Data: No additional findings.  ROS:  All other systems negative, except as noted in the HPI. Review of Systems  Objective: Vital Signs: There were no vitals taken for this visit.  Specialty Comments:  No specialty comments available.  PMFS History: Patient Active Problem List   Diagnosis Date Noted   Intermediate stage nonexudative age-related macular degeneration of both eyes 02/25/2020   Posterior vitreous detachment of both eyes 02/25/2020   Rash and nonspecific skin eruption 08/28/2013   Fever, unspecified 08/27/2013   Pancytopenia ([facility]) 08/27/2013   Elevated liver function tests 08/27/2013   Leukopenia 08/25/2013   HEMORRHOIDS, INTERNAL 01/05/2010   FECAL INCONTINENCE 01/05/2010   Past Medical History:  Diagnosis Date   Blood dyscrasia    Cataract    Fever 08/27/2013   Heart murmur    Osteoporosis     Family History  Problem Relation  Age of Onset   Kidney disease Father    Heart disease Maternal Grandfather    Heart disease Paternal Grandmother     Past Surgical History:  Procedure Laterality Date   CATARACT EXTRACTION  2013   EYE SURGERY     Social History   Occupational History   Not on file  Tobacco Use   Smoking status: Never   Smokeless tobacco: Never  Substance and Sexual Activity   Alcohol use: No   Drug use: No   Sexual activity: Yes

## 2021-02-03 ENCOUNTER — Encounter: Payer: Self-pay | Admitting: Podiatry

## 2021-02-03 ENCOUNTER — Other Ambulatory Visit: Payer: Self-pay

## 2021-02-03 ENCOUNTER — Ambulatory Visit (INDEPENDENT_AMBULATORY_CARE_PROVIDER_SITE_OTHER): Payer: Medicare Other

## 2021-02-03 ENCOUNTER — Other Ambulatory Visit: Payer: Self-pay | Admitting: Podiatry

## 2021-02-03 ENCOUNTER — Ambulatory Visit (INDEPENDENT_AMBULATORY_CARE_PROVIDER_SITE_OTHER): Payer: Medicare Other | Admitting: Podiatry

## 2021-02-03 DIAGNOSIS — M778 Other enthesopathies, not elsewhere classified: Secondary | ICD-10-CM

## 2021-02-03 DIAGNOSIS — S92324A Nondisplaced fracture of second metatarsal bone, right foot, initial encounter for closed fracture: Secondary | ICD-10-CM
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
## 2021-02-09 ENCOUNTER — Telehealth: Payer: Self-pay | Admitting: Orthopedic Surgery

## 2021-02-09 NOTE — Telephone Encounter (Signed)
Patient wants copy of 9/22 foot xray. Please call when ready. [phone]

## 2021-02-09 NOTE — Telephone Encounter (Signed)
Spoke with patient who requested CD be mailed to her home, placed CD in mail.

## 2021-02-16 ENCOUNTER — Telehealth: Payer: Self-pay | Admitting: Orthopedic Surgery

## 2021-02-16 NOTE — Telephone Encounter (Signed)
Spoke with patient who stated she has not received the xray of her CD, told patient that I placed it in the mail 12/05 but that it could take awhile to make it to he. Advised her to call back if she still has not received it in a few days and I can make her another copy for pickup if needed.

## 2021-02-16 NOTE — Telephone Encounter (Signed)
Pt called sating she was supposed to have a CD with her x-rays mailed to her and the last person she spoke with was [place]. Pt would like a CB in regards to the CD.   [phone]

## 2021-02-19 ENCOUNTER — Telehealth: Payer: Self-pay | Admitting: Radiology

## 2021-02-19 NOTE — Telephone Encounter (Signed)
Patient called and stated she still has not received her CD in the mail, I will make her a new copy and she will pick it up in office today.

## 2021-02-24 ENCOUNTER — Other Ambulatory Visit: Payer: Self-pay

## 2021-02-24 ENCOUNTER — Ambulatory Visit (INDEPENDENT_AMBULATORY_CARE_PROVIDER_SITE_OTHER): Payer: Medicare Other | Admitting: Ophthalmology

## 2021-02-24 ENCOUNTER — Encounter (INDEPENDENT_AMBULATORY_CARE_PROVIDER_SITE_OTHER): Payer: Self-pay | Admitting: Ophthalmology

## 2021-02-24 DIAGNOSIS — H353132 Nonexudative age-related macular degeneration, bilateral, intermediate dry stage: Secondary | ICD-10-CM

## 2021-02-24 DIAGNOSIS — H43813 Vitreous degeneration, bilateral: Secondary | ICD-10-CM

## 2021-02-24 NOTE — Assessment & Plan Note (Signed)
OD, with large subfoveal drusenoid deposit over the last 2 years which has spontaneously resolved over the last year.  No sign of CNVM

## 2021-02-24 NOTE — Assessment & Plan Note (Signed)
Physiologic OU 

## 2021-02-24 NOTE — Progress Notes (Signed)
02/24/2021     CHIEF COMPLAINT Patient presents for  Chief Complaint  Patient presents with   Retina Follow Up      HISTORY OF PRESENT ILLNESS: Katherine Atkins is a 80 y.o. female who presents to the clinic today for:   HPI     Retina Follow Up           Diagnosis: Dry AMD   Laterality: both eyes   Onset: 1 year ago   Severity: mild   Duration: 1 year   Course: stable         Comments   1 year fu ou and oct- Intermediate stage NEARMD   Pt states, "I cannot see that anything has really changed with my distance but my close up eye seems to be a little diminished."  Pt states VA OU stable since last visit. Pt denies FOL, floaters, or ocular pain OU.         Last edited by Demetrios Loll, COA on 02/24/2021  8:01 AM.      Referring physician: Maris Berger, MD [address] [place],  [place] [address]  HISTORICAL INFORMATION:   Selected notes from the MEDICAL RECORD NUMBER       CURRENT MEDICATIONS: No current outpatient medications on file. (Ophthalmic Drugs)   No current facility-administered medications for this visit. (Ophthalmic Drugs)   No current outpatient medications on file. (Other)   No current facility-administered medications for this visit. (Other)      REVIEW OF SYSTEMS: ROS   Negative for: Constitutional, Gastrointestinal, Neurological, Skin, Genitourinary, Musculoskeletal, HENT, Endocrine, Cardiovascular, Eyes, Respiratory, Psychiatric, Allergic/Imm, Heme/Lymph Last edited by Edmon Crape, MD on 02/24/2021  8:17 AM.       ALLERGIES No Known Allergies  PAST MEDICAL HISTORY Past Medical History:  Diagnosis Date   Blood dyscrasia    Cataract    Fever 08/27/2013   Heart murmur    Osteoporosis    Past Surgical History:  Procedure Laterality Date   CATARACT EXTRACTION  2013   EYE SURGERY      FAMILY HISTORY Family History  Problem Relation Age of Onset   Kidney disease Father    Heart disease Maternal  Grandfather    Heart disease Paternal Grandmother     SOCIAL HISTORY Social History   Tobacco Use   Smoking status: Never   Smokeless tobacco: Never  Substance Use Topics   Alcohol use: No   Drug use: No         OPHTHALMIC EXAM:  Base Eye Exam     Visual Acuity (ETDRS)       Right Left   Dist [place] 20/20 -2 20/40   Dist ph [place]  20/20         Tonometry (Tonopen, 8:04 AM)       Right Left   Pressure 10 13         Pupils       Pupils React APD   Right PERRL Brisk None   Left PERRL Brisk None         Visual Fields (Counting fingers)       Left Right    Full Full         Extraocular Movement       Right Left    Full, Ortho Full, Ortho         Neuro/Psych     Oriented x3: Yes         Dilation  Both eyes: 1.0% Mydriacyl, 2.5% Phenylephrine @ 8:04 AM           Slit Lamp and Fundus Exam     External Exam       Right Left   External Normal Normal         Slit Lamp Exam       Right Left   Lids/Lashes Normal Normal   Conjunctiva/Sclera White and quiet White and quiet   Cornea Clear Clear   Anterior Chamber Deep and quiet Deep and quiet   Iris Round and reactive Round and reactive   Lens Centered posterior chamber intraocular lens Centered posterior chamber intraocular lens   Anterior Vitreous Normal Normal         Fundus Exam       Right Left   Posterior Vitreous Posterior vitreous detachment Posterior vitreous detachment   Disc Normal Normal   C/D Ratio 0.65 0.65   Macula Intermediate age related macular degeneration, no macular thickening, no hemorrhage, no exudates, Pigmented atrophy in the faz Intermediate age related macular degeneration, no macular thickening, no hemorrhage, no exudates   Vessels Normal Normal   Periphery Normal Normal            IMAGING AND PROCEDURES  Imaging and Procedures for 02/24/21  OCT, Retina - OU - Both Eyes       Right Eye Quality was good. Scan locations included  subfoveal. Central Foveal Thickness: 316. Progression has been stable. Findings include no IRF, retinal drusen , no SRF, pigment epithelial detachment.   Left Eye Quality was good. Scan locations included subfoveal. Central Foveal Thickness: 302. Progression has been stable. Findings include no SRF, no IRF, pigment epithelial detachment.   Notes Subfoveal drusenoid pigment epithelial detachments initially seen some 2 years previous and then worsening at the end of 2021 have completely resolved in a subfoveal location OD., no signs of active CNVM OU.             ASSESSMENT/PLAN:  Intermediate stage nonexudative age-related macular degeneration of both eyes OD, with large subfoveal drusenoid deposit over the last 2 years which has spontaneously resolved over the last year.  No sign of CNVM  Posterior vitreous detachment of both eyes Physiologic OU     ICD-10-CM   1. Intermediate stage nonexudative age-related macular degeneration of both eyes  H35.3132 OCT, Retina - OU - Both Eyes    2. Posterior vitreous detachment of both eyes  H43.813       1.  OU continue with Amsler grid on a daily basis.  2.  Patient continues on using AREDS 2 recipe for Ocuvite or vitamin therapy  3.  Follow-up with [facility] as scheduled, Dr. Maris Berger  Ophthalmic Meds Ordered this visit:  No orders of the defined types were placed in this encounter.      Return in about 1 year (around 02/24/2022) for DILATE OU, OCT.  There are no Patient Instructions on file for this visit.   Explained the diagnoses, plan, and follow up with the patient and they expressed understanding.  Patient expressed understanding of the importance of proper follow up care.   Alford Highland Kerrick Miler M.D. Diseases & Surgery of the Retina and Vitreous [facility] 02/24/21     Abbreviations: M myopia (nearsighted); A astigmatism; H hyperopia (farsighted); P presbyopia; Mrx  spectacle prescription;  CTL contact lenses; OD right eye; OS left eye; OU both eyes  XT exotropia; ET esotropia; PEK punctate epithelial  keratitis; PEE punctate epithelial erosions; DES dry eye syndrome; MGD meibomian gland dysfunction; ATs artificial tears; PFAT's preservative free artificial tears; NSC nuclear sclerotic cataract; PSC posterior subcapsular cataract; ERM epi-retinal membrane; PVD posterior vitreous detachment; RD retinal detachment; DM diabetes mellitus; DR diabetic retinopathy; NPDR non-proliferative diabetic retinopathy; PDR proliferative diabetic retinopathy; CSME clinically significant macular edema; DME diabetic macular edema; dbh dot blot hemorrhages; CWS cotton wool spot; POAG primary open angle glaucoma; C/D cup-to-disc ratio; HVF humphrey visual field; GVF goldmann visual field; OCT optical coherence tomography; IOP intraocular pressure; BRVO Branch retinal vein occlusion; CRVO central retinal vein occlusion; CRAO central retinal artery occlusion; BRAO branch retinal artery occlusion; RT retinal tear; SB scleral buckle; PPV pars plana vitrectomy; VH Vitreous hemorrhage; PRP panretinal laser photocoagulation; IVK intravitreal kenalog; VMT vitreomacular traction; MH Macular hole;  NVD neovascularization of the disc; NVE neovascularization elsewhere; AREDS age related eye disease study; ARMD age related macular degeneration; POAG primary open angle glaucoma; EBMD epithelial/anterior basement membrane dystrophy; ACIOL anterior chamber intraocular lens; IOL intraocular lens; PCIOL posterior chamber intraocular lens; Phaco/IOL phacoemulsification with intraocular lens placement; PRK photorefractive keratectomy; LASIK laser assisted in situ keratomileusis; HTN hypertension; DM diabetes mellitus; COPD chronic obstructive pulmonary disease

## 2021-03-23 ENCOUNTER — Telehealth: Payer: Self-pay | Admitting: Orthopedic Surgery

## 2021-03-23 ENCOUNTER — Other Ambulatory Visit: Payer: Self-pay

## 2021-03-23 DIAGNOSIS — M79671 Pain in right foot: Secondary | ICD-10-CM

## 2021-03-23 NOTE — Telephone Encounter (Signed)
Called pt and lm on vm to advise that order for PT has been entered and she can call and make appt left number for the [address] location [phone] to call with any questions.

## 2021-03-23 NOTE — Telephone Encounter (Signed)
Received call from pt,lmvm stating wants to be referred to [facility] rehab. Pts callback [phone]

## 2021-03-26 ENCOUNTER — Ambulatory Visit: Payer: Medicare Other | Attending: Orthopedic Surgery

## 2021-03-26 ENCOUNTER — Other Ambulatory Visit: Payer: Self-pay

## 2021-03-26 DIAGNOSIS — M79671 Pain in right foot: Secondary | ICD-10-CM | POA: Diagnosis present

## 2021-03-26 DIAGNOSIS — M6281 Muscle weakness (generalized): Secondary | ICD-10-CM | POA: Diagnosis present

## 2021-03-26 DIAGNOSIS — R2689 Other abnormalities of gait and mobility: Secondary | ICD-10-CM | POA: Insufficient documentation

## 2021-03-26 DIAGNOSIS — G8929 Other chronic pain: Secondary | ICD-10-CM | POA: Diagnosis present
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
## 2021-04-02 NOTE — Therapy (Signed)
OUTPATIENT PHYSICAL THERAPY TREATMENT NOTE   Patient Name: Katherine Atkins MRN: [id number] DOB:02/24/1941, 81 y.o., female Today's Date: 04/03/2021  PCP: Burnard Bunting, MD REFERRING PROVIDER: Newt Minion, MD   PT End of Session - 04/03/21 0726     Visit Number 2    Number of Visits 13    Date for PT Re-Evaluation 05/15/21    Authorization Type [employer] MEDICARE    Progress Note Due on Visit 10    PT Start Time 0718    PT Stop Time 0800    PT Time Calculation (min) 42 min    Activity Tolerance Patient tolerated treatment well    Behavior During Therapy [facility] for tasks assessed/performed             Past Medical History:  Diagnosis Date   Blood dyscrasia    Cataract    Fever 08/27/2013   Heart murmur    Osteoporosis    Past Surgical History:  Procedure Laterality Date   CATARACT EXTRACTION  2013   EYE SURGERY     Patient Active Problem List   Diagnosis Date Noted   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 Pt reports occasional dizziness/light-headedness   PERTINENT HISTORY: Nondisplaced right ankle lateral malleolar fracture; Intra-articular, small avulsion fracture at the base of the proximal phalanx second digit laterally. Cateracts, osteoporosis. Fractured L LE 50 years ago in a skiing accident.   PAIN:  Are you having pain? Yes NPRS scale: [id number] with certain  movement Pain location: Foot Pain orientation: Right  PAIN TYPE: aching Pain description: intermittent  Aggravating factors: Certain steps Relieving factors: Rest   PRECAUTIONS: None   OCCUPATION: [employer]   PLOF: Independent   PATIENT GOALS : To strengthening and improve my balance     OBJECTIVE:    DIAGNOSTIC FINDINGS:  Xray 11/27/20 R foot 2 view radiographs of the right foot shows a nondisplaced fracture base of  the tuft of the great toe as well as a comminuted fracture base of the  proximal phalanx second toe.   PATIENT SURVEYS:  FOTO 61%   COGNITION:          Overall cognitive status: Within functional limits for tasks assessed                        SENSATION:          Light touch: Appears intact             POSTURE:   Neutral arch height R, Pes planus L   PALPATION: No tenderness R foot   LE AROM/PROM:   A/PROM Right 03/26/2021 Left 03/26/2021  Hip flexion      Hip extension      Hip abduction      Hip adduction      Hip internal rotation      Hip external rotation  Knee flexion      Knee extension      Ankle dorsiflexion 5 5  Ankle plantarflexion 35 33  Ankle inversion 30 40  Ankle eversion 30 25   (Blank rows = not tested)   LE MMT:   MMT Right 03/26/2021 Left 03/26/2021  Hip flexion 4+ 4+  Hip extension 4+ 4+  Hip abduction 4+ 4+  Hip adduction 5 5  Hip internal rotation 5 5  Hip external rotation 4+ 4+  Knee flexion 5 5  Knee extension 5 5  Ankle dorsiflexion 5 5  Ankle plantarflexion 4+ 4+  Ankle inversion 4+ 4+  Ankle eversion 4+ 4+   (Blank rows = not tested)     FUNCTIONAL TESTS:  5 times sit to stand: 20.8 2 minute walk test: 471 ft Berg Balance Scale: 49/56   GAIT: Distance walked: Within the clinic Assistive device utilized: None Level of assistance: Complete Independence Comments: A heal to toe pattern,min L LE medial rotation      [facility] Adult PT Treatment:                                                 DATE: 04/03/21 Therapeutic Exercise: Bridging 2x10, 5" Supine hip clam 2x10 3"   Towel scrunches x10 Standing ankle DF/PF x10 at counter SL balance, 2x, 20 sec, R and L  Therapeutic Activity: 2MWT- 486ft BERG BALANCE Sitting to Standing: Numbers; 0-4: 4  4. Stands without using hands and stabilize independently  3. Stands independently using hands  2. Stands using hands after multiple trials  1. Min A to stand  0. Mod-Max A to stand Standing unsupported: Numbers; 0-4: 4  4. Stands safely for 2 minutes  3. Stands 2 minutes with supervision  2. Stands 30 seconds unsupported  1. Needs several tries to stand unsupported for 30 seconds  0. Unable to stand unsupported for 30 seconds Sitting unsupported: Numbers; 0-4: 4  4. Sits for 2 minutes independently  3. Sits for 2 minutes with supervision  2. Able to sit 30 seconds  1. Able to sit 10 seconds  0. Unable to sit for 10 seconds Standing to Sitting: Numbers; 0-4: 4 4. Sits safely with minimal use of hands 3. Controls descent with hands 2. Uses back of legs against chair to control descent 1. Sits independently, but uncontrolled descent 0. Needs assistance Transfers: Numbers; 0-4: 4  4. Transfers safely with minor use of hands  3. Transfers safely definite use of hands  2. Transfers with verbal cueing/supervision  1. Needs 1 person assist  0. Needs 2 person assist  Standing with eyes closed: Numbers; 0-4: 3  4. Stands safely for 10 seconds  3. Stands 10 seconds with supervision   2. Able to stand for 3 seconds  1. Unable to keep eyes closed for 3 seconds, but is safe  0. Needs assist to keep from falling Standing with feet together: Numbers; 0-4: 4 4. Stands for 1 minute safely 3. Stands for 1 minute with supervision 2. Unable to hold for 30 seconds  1. Needs help to attain position but can hold for 15 seconds  0. Needs help to attain position and unable to hold for 15 seconds Reaching forward with outstretched arm:  Numbers; 0-4: 4  4. Reaches forward 10 inches  3. Reaches forward 5 inches  2. Reaches  forward 2 inches  1. Reaches forward with supervision  0. Loses balance/requires assistace Retrieving object from the floor: Numbers; 0-4: 4 4. Able to pick up easily and safely 3. Able to pick up with supervision 2. Unable to pick up, but reaches within 1-2 inches independently 1. Unable to pick up and needs supervision 0. Unable/needs assistance to keep from falling  Turning to look behind: Numbers; 0-4: 3  4. Looks behind from both sides and weight shifts well  3. Looks behind one side only, other side less weight shift  2. Turns sideways only, maintains balance  1. Needs supervision when turning  0. Needs assistance  Turning 360 degrees: Numbers; 0-4: 3  4. Able to turn in </=4 seconds  3. Able to turn on one side in </= 4 seconds   2. Able to turn slowly, but safely  1. Needs supervision or verbal cueing  0. Needs assistance Place alternate foot on stool: Numbers; 0-4: 4 4. Completes 8 steps in 20 seconds 3. Completes 8 steps in >20 seconds 2. 4 steps without assistance/supervision 1. Completes >2 steps with minimal assist 0. Unable, needs assist to keep from falling Standing with one foot in front: Numbers; 0-4: 3  4. Independent tandem for 30 seconds  3. Independent foot ahead for 30 seconds  2. Independent small step for 30 seconds  1. Needs help to step, but can hold for 15 seconds  0. Loses balance while standing/stepping Standing on one foot: Numbers; 0-4: 1 4. Holds >10 seconds 3. Holds 5-10 seconds 2. Holds >/=3 seconds  1. Holds <3 seconds 0. Unable  Total Score: 49/56   Self Care: Updated HEP      TODAY'S TREATMENT: Eval Towel scrunches x10 Standing ankle DF/PF x10 at counter SL balance, 2x, 20 sec, R and L       PATIENT EDUCATION:  Education details: Eval findings, POC, HEP, value/purpose of supportive shoes Person educated: Patient Education method:  Explanation, Demonstration, Tactile cues, Verbal cues, and Handouts Education comprehension: verbalized understanding, returned demonstration, verbal cues required, and tactile cues required     HOME EXERCISE PROGRAM: Access Code: [email or web address] URL: [email or web address] Date: 04/03/2021 Prepared by: Joellyn Rued  Exercises Seated Toe Towel Scrunches - 1 x daily - 7 x weekly - 2 sets - 15 reps Heel Toe Raises with Counter Support - 1 x daily - 7 x weekly - 2 sets - 15 reps - 2 hold Standing Single Leg Stance with Counter Support - 1 x daily - 7 x weekly - 1 sets - 5 reps - 20 hold Tandem Stance with Support - 1 x daily - 7 x weekly - 3 sets - 5 reps - 20 hold Supine Bridge - 1 x daily - 7 x weekly - 2 sets - 10 reps - 5 hold Hooklying Clamshell with Resistance - 1 x daily - 7 x weekly - 2 sets - 10 reps - 3 hold      ASSESSMENT:   CLINICAL IMPRESSION: PT was completed for LE strengthening c focus on the hips and for balance. Pt's 2 minute walking test was found to be within normal limits for pt's age. Balance was found to be appropriate, but with pt having more difficulty with SL, narrow BOS, and with eyes closed. Pt's report of dizziness and light headedness seem to be have a degree occular and/or vertibular component. Pt's HEP was updated. Pt tolerated today's PT session without adverse effects. Pt will continue to benefit from skilled PT to  address deficits to optimize functional mobility and balance.   REHAB POTENTIAL: Good   CLINICAL DECISION MAKING: Stable/uncomplicated   EVALUATION COMPLEXITY: Low     GOALS:   SHORT TERM GOALS=LTGs     LONG TERM GOALS:    LTG Name Target Date Goal status  1 Pt will be Ind in a final HEP to maintain achieved LOF Baseline: 05/15/21 INITIAL  2 Increase bilat ankle to 10d bilat for improved ankle function and mobility Baseline: 05/15/21 INITIAL  3 Increase R ankle and hip strength to 5/5 for improved ankle function and  mobility Baseline:see flow sheets 05/15/21 INITIAL  4 Increase pt's FOTO score to the predicted value of 70% Baseline:61% 05/15/21 INITIAL  5 Improve pt's 5xSTS to 15 sec or less for improved function Baseline: 05/15/21 INITIAL  6  Improve pt's Berg Balance to 52/56 score as indication of imprved functional balance Baseline:49/56 05/15/21  INITIAL    PLAN: PT FREQUENCY: 2x/week   PT DURATION: 6 weeks   PLANNED INTERVENTIONS: Therapeutic exercises, Therapeutic activity, Neuro Muscular re-education, Balance training, Gait training, Patient/Family education, Joint mobilization, Dry Needling, Electrical stimulation, Cryotherapy, Moist heat, Taping, Ultrasound, Ionotophoresis 4mg /ml Dexamethasone, and Manual therapy   PLAN FOR NEXT SESSION: Assess 2MWT and Berg and set goals; Assess response to HEP: Progress therex as indicated. Introduce balance exs with eyes closed.  Zayneb Baucum MS, PT 04/03/21 10:41 AM

## 2021-04-03 ENCOUNTER — Ambulatory Visit: Payer: Medicare Other

## 2021-04-03 ENCOUNTER — Other Ambulatory Visit: Payer: Self-pay

## 2021-04-03 DIAGNOSIS — G8929 Other chronic pain: Secondary | ICD-10-CM

## 2021-04-03 DIAGNOSIS — M79671 Pain in right foot: Secondary | ICD-10-CM | POA: Diagnosis not present

## 2021-04-03 DIAGNOSIS — M6281 Muscle weakness (generalized): Secondary | ICD-10-CM

## 2021-04-03 DIAGNOSIS — R2689 Other abnormalities of gait and mobility: Secondary | ICD-10-CM

## 2021-04-07 ENCOUNTER — Ambulatory Visit: Payer: Medicare Other

## 2021-04-07 ENCOUNTER — Other Ambulatory Visit: Payer: Self-pay

## 2021-04-07 DIAGNOSIS — G8929 Other chronic pain: Secondary | ICD-10-CM

## 2021-04-07 DIAGNOSIS — M79671 Pain in right foot: Secondary | ICD-10-CM

## 2021-04-07 DIAGNOSIS — M6281 Muscle weakness (generalized): Secondary | ICD-10-CM

## 2021-04-07 DIAGNOSIS — R2689 Other abnormalities of gait and mobility: Secondary | ICD-10-CM

## 2021-04-07 NOTE — Therapy (Signed)
OUTPATIENT PHYSICAL THERAPY TREATMENT NOTE   Patient Name: Katherine Atkins MRN: [id number] DOB:February 02, 1941, 81 y.o., female Today's Date: 04/07/2021  PCP: Geoffry ParadiseAronson, Richard, MD REFERRING PROVIDER: Geoffry ParadiseAronson, Richard, MD   PT End of Session - 04/07/21 [phone]     Visit Number 3    Number of Visits 13    Date for PT Re-Evaluation 05/15/21    Authorization Type [employer] MEDICARE    Progress Note Due on Visit 10    PT Start Time 0809    PT Stop Time 0850    PT Time Calculation (min) 41 min    Activity Tolerance Patient tolerated treatment well    Behavior During Therapy [facility] for tasks assessed/performed              Past Medical History:  Diagnosis Date   Blood dyscrasia    Cataract    Fever 08/27/2013   Heart murmur    Osteoporosis    Past Surgical History:  Procedure Laterality Date   CATARACT EXTRACTION  2013   EYE SURGERY     Patient Active Problem List   Diagnosis Date Noted   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   PERTINENT HISTORY: Nondisplaced right ankle lateral malleolar fracture; Intra-articular, small avulsion fracture at the base of the proximal phalanx second digit laterally. Cateracts, osteoporosis. Fractured L LE 50 years ago in a skiing accident.   PAIN:  Are you having pain? Yes NPRS scale: 0/10 with certain movement Pain location: Foot Pain orientation: Right  PAIN TYPE: aching Pain  description: intermittent  Aggravating factors: Certain steps Relieving factors: Rest   PRECAUTIONS: None   OCCUPATION: [employer]   PLOF: Independent   PATIENT GOALS : To strengthening and improve my balance     OBJECTIVE:    DIAGNOSTIC FINDINGS:  Xray 11/27/20 R foot 2 view radiographs of the right foot shows a nondisplaced fracture base of  the tuft of the great toe as well as a comminuted fracture base of the  proximal phalanx second toe.   PATIENT SURVEYS:  FOTO 61%   COGNITION:          Overall cognitive status: Within functional limits for tasks assessed                        SENSATION:          Light touch: Appears intact             POSTURE:   Neutral arch height R, Pes planus L   PALPATION: No tenderness R foot   LE AROM/PROM:   A/PROM Right 03/26/2021 Left 03/26/2021  Hip flexion      Hip extension      Hip abduction      Hip adduction      Hip internal rotation      Hip external rotation      Knee flexion      Knee extension  Ankle dorsiflexion 5 5  Ankle plantarflexion 35 33  Ankle inversion 30 40  Ankle eversion 30 25   (Blank rows = not tested)   LE MMT:   MMT Right 03/26/2021 Left 03/26/2021  Hip flexion 4+ 4+  Hip extension 4+ 4+  Hip abduction 4+ 4+  Hip adduction 5 5  Hip internal rotation 5 5  Hip external rotation 4+ 4+  Knee flexion 5 5  Knee extension 5 5  Ankle dorsiflexion 5 5  Ankle plantarflexion 4+ 4+  Ankle inversion 4+ 4+  Ankle eversion 4+ 4+   (Blank rows = not tested)     FUNCTIONAL TESTS:  5 times sit to stand: 20.8 2 minute walk test: 471 ft Berg Balance Scale: 49/56   GAIT: Distance walked: Within the clinic Assistive device utilized: None Level of assistance: Complete Independence Comments: A heal to toe pattern,min L LE medial rotation   [facility] Adult PT Treatment:                                                DATE: 04/07/21 Therapeutic Exercise: NuStep 5mins, L7, UE/LE   Gastro stretch,  x2, 20 sec, L and R Bridging 2x10, 5" Supine hip clam GTB 2x10 3"   Towel scrunches 2x10 Standing ankle DF/PF x10 at counter x10 STS  Therapeutic Activity: SLS, x2, 30", L and R DLS c eyes closed, normal stance and narrow stance,x2 each  Self Care: Updated HEP     [facility] Adult PT Treatment:                                                DATE: 04/03/21 Therapeutic Exercise: Bridging 2x10, 5" Supine hip clam 2x10 3"   Towel scrunches x10 Standing ankle DF/PF x10 at counter SL balance, 2x, 20 sec, R and L  Therapeutic Activity: 2MWT- 49341ft BERG BALANCE Sitting to Standing: Numbers; 0-4: 4  4. Stands without using hands and stabilize independently  3. Stands independently using hands  2. Stands using hands after multiple trials  1. Min A to stand  0. Mod-Max A to stand Standing unsupported: Numbers; 0-4: 4  4. Stands safely for 2 minutes  3. Stands 2 minutes with supervision  2. Stands 30 seconds unsupported  1. Needs several tries to stand unsupported for 30 seconds  0. Unable to stand unsupported for 30 seconds Sitting unsupported: Numbers; 0-4: 4  4. Sits for 2 minutes independently  3. Sits for 2 minutes with supervision  2. Able to sit 30 seconds  1. Able to sit 10 seconds  0. Unable to sit for 10 seconds Standing to Sitting: Numbers; 0-4: 4 4. Sits safely with minimal use of hands 3. Controls descent with hands 2. Uses back of legs against chair to control descent 1. Sits independently, but uncontrolled descent 0. Needs assistance Transfers: Numbers; 0-4: 4  4. Transfers safely with minor use of hands  3. Transfers safely definite use of hands  2. Transfers with verbal cueing/supervision  1. Needs 1 person assist  0. Needs 2 person assist  Standing with eyes closed: Numbers; 0-4: 3  4. Stands safely for 10 seconds  3. Stands 10 seconds with supervision   2. Able to stand  for 3 seconds  1. Unable to keep eyes closed for 3 seconds, but is safe  0. Needs  assist to keep from falling Standing with feet together: Numbers; 0-4: 4 4. Stands for 1 minute safely 3. Stands for 1 minute with supervision 2. Unable to hold for 30 seconds  1. Needs help to attain position but can hold for 15 seconds  0. Needs help to attain position and unable to hold for 15 seconds Reaching forward with outstretched arm: Numbers; 0-4: 4  4. Reaches forward 10 inches  3. Reaches forward 5 inches  2. Reaches forward 2 inches  1. Reaches forward with supervision  0. Loses balance/requires assistace Retrieving object from the floor: Numbers; 0-4: 4 4. Able to pick up easily and safely 3. Able to pick up with supervision 2. Unable to pick up, but reaches within 1-2 inches independently 1. Unable to pick up and needs supervision 0. Unable/needs assistance to keep from falling  Turning to look behind: Numbers; 0-4: 3  4. Looks behind from both sides and weight shifts well  3. Looks behind one side only, other side less weight shift  2. Turns sideways only, maintains balance  1. Needs supervision when turning  0. Needs assistance  Turning 360 degrees: Numbers; 0-4: 3  4. Able to turn in </=4 seconds  3. Able to turn on one side in </= 4 seconds   2. Able to turn slowly, but safely  1. Needs supervision or verbal cueing  0. Needs assistance Place alternate foot on stool: Numbers; 0-4: 4 4. Completes 8 steps in 20 seconds 3. Completes 8 steps in >20 seconds 2. 4 steps without assistance/supervision 1. Completes >2 steps with minimal assist 0. Unable, needs assist to keep from falling Standing with one foot in front: Numbers; 0-4: 3  4. Independent tandem for 30 seconds  3. Independent foot ahead for 30 seconds  2. Independent small step for 30 seconds  1. Needs help to step, but can hold for 15 seconds  0. Loses balance while standing/stepping Standing on one foot: Numbers; 0-4: 1 4. Holds >10 seconds 3. Holds 5-10 seconds 2. Holds >/=3 seconds  1. Holds <3  seconds 0. Unable  Total Score: 49/56   Self Care: Updated HEP      TODAY'S TREATMENT: Eval Towel scrunches x10 Standing ankle DF/PF x10 at counter SL balance, 2x, 20 sec, R and L       PATIENT EDUCATION:  Education details: Eval findings, POC, HEP, value/purpose of supportive shoes Person educated: Patient Education method: Explanation, Demonstration, Tactile cues, Verbal cues, and Handouts Education comprehension: verbalized understanding, returned demonstration, verbal cues required, and tactile cues required     HOME EXERCISE PROGRAM: Access Code: [email or web address] URL: [email or web address] Date: 04/07/2021 Prepared by: Joellyn Rued  Exercises Seated Toe Towel Scrunches - 1 x daily - 7 x weekly - 2 sets - 15 reps Heel Toe Raises with Counter Support - 1 x daily - 7 x weekly - 2 sets - 15 reps - 2 hold Standing Single Leg Stance with Counter Support - 1 x daily - 7 x weekly - 1 sets - 5 reps - 20 hold Tandem Stance with Support - 1 x daily - 7 x weekly - 1 sets - 5 reps - 20 hold Supine Bridge - 1 x daily - 7 x weekly - 2 sets - 10 reps - 5 hold Hooklying Clamshell with Resistance - 1 x daily - 7 x weekly - 2 sets -  10 reps - 3 hold Gastroc Stretch on Wall - 1 x daily - 7 x weekly - 1 sets - 2 reps - 20 hold Corner Balance Feet Together Eyes Closed - 1 x daily - 7 x weekly - 1 sets - 2-4 reps - 20 hold     ASSESSMENT:   CLINICAL IMPRESSION: PT was completed for LE strengthening and balance activities. LE strengthening included the hips, knees and ankles. With balance activities, close eyes corner balance exercises were added. Pt needed hand assist intermittently with this activity. Pt reported some dizziness after completing the balance exs c eyes closed which resolved after a brief rest period. Pt tolerated the PT session without adverse effects. Pt will continue to benefit from skilled PT to improve LE strength and balance for optimized functional mobility.    REHAB POTENTIAL: Good   CLINICAL DECISION MAKING: Stable/uncomplicated   EVALUATION COMPLEXITY: Low     GOALS:   SHORT TERM GOALS=LTGs     LONG TERM GOALS:    LTG Name Target Date Goal status  1 Pt will be Ind in a final HEP to maintain achieved LOF Baseline: 05/15/21 INITIAL  2 Increase bilat ankle to 10d bilat for improved ankle function and mobility Baseline: 05/15/21 INITIAL  3 Increase R ankle and hip strength to 5/5 for improved ankle function and mobility Baseline:see flow sheets 05/15/21 INITIAL  4 Increase pt's FOTO score to the predicted value of 70% Baseline:61% 05/15/21 INITIAL  5 Improve pt's 5xSTS to 15 sec or less for improved function Baseline: 05/15/21 INITIAL  6  Improve pt's Berg Balance to 52/56 score as indication of imprved functional balance Baseline:49/56 05/15/21  INITIAL    PLAN: PT FREQUENCY: 2x/week   PT DURATION: 6 weeks   PLANNED INTERVENTIONS: Therapeutic exercises, Therapeutic activity, Neuro Muscular re-education, Balance training, Gait training, Patient/Family education, Joint mobilization, Dry Needling, Electrical stimulation, Cryotherapy, Moist heat, Taping, Ultrasound, Ionotophoresis 4mg /ml Dexamethasone, and Manual therapy   PLAN FOR NEXT SESSION: Assess and Berg and set goals; Assess response to HEP: Progress therex as indicated. Introduce balance exs with eyes closed.  Anginette Espejo MS, PT 04/07/21 10:19 AM

## 2021-04-10 ENCOUNTER — Ambulatory Visit: Payer: Medicare Other | Attending: Orthopedic Surgery | Admitting: Physical Therapy

## 2021-04-10 ENCOUNTER — Encounter: Payer: Self-pay | Admitting: Physical Therapy

## 2021-04-10 ENCOUNTER — Other Ambulatory Visit: Payer: Self-pay

## 2021-04-10 DIAGNOSIS — M6281 Muscle weakness (generalized): Secondary | ICD-10-CM | POA: Diagnosis not present

## 2021-04-10 DIAGNOSIS — G8929 Other chronic pain: Secondary | ICD-10-CM | POA: Insufficient documentation

## 2021-04-10 DIAGNOSIS — R2689 Other abnormalities of gait and mobility: Secondary | ICD-10-CM | POA: Diagnosis present

## 2021-04-10 DIAGNOSIS — M79671 Pain in right foot: Secondary | ICD-10-CM | POA: Diagnosis present

## 2021-04-10 NOTE — Therapy (Signed)
OUTPATIENT PHYSICAL THERAPY TREATMENT NOTE   Patient Name: Katherine Atkins MRN: [id number] DOB:November 26, 1940, 81 y.o., female Today's Date: 04/10/2021  PCP: Burnard Bunting, MD REFERRING PROVIDER: Newt Minion, MD   PT End of Session - 04/10/21 0805     Visit Number 4    Number of Visits 13    Date for PT Re-Evaluation 05/15/21    Authorization Type [employer] MEDICARE    PT Start Time 0804    PT Stop Time 0845    PT Time Calculation (min) 41 min              Past Medical History:  Diagnosis Date   Blood dyscrasia    Cataract    Fever 08/27/2013   Heart murmur    Osteoporosis    Past Surgical History:  Procedure Laterality Date   CATARACT EXTRACTION  2013   EYE SURGERY     Patient Active Problem List   Diagnosis Date Noted   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   PERTINENT HISTORY: Nondisplaced right ankle lateral malleolar fracture; Intra-articular, small avulsion fracture at the base of the proximal phalanx second digit laterally. Cateracts, osteoporosis. Fractured L LE 50 years ago in a skiing accident.   PAIN:  Are you having pain? Yes NPRS scale: 0000000 with certain movement Pain location: Foot Pain orientation: Right  PAIN TYPE: aching Pain description: intermittent  Aggravating factors: Certain steps Relieving factors: Rest   PRECAUTIONS: None   OCCUPATION: [employer]   PLOF:  Independent   PATIENT GOALS : To strengthening and improve my balance     OBJECTIVE:    DIAGNOSTIC FINDINGS:  Xray 11/27/20 R foot 2 view radiographs of the right foot shows a nondisplaced fracture base of  the tuft of the great toe as well as a comminuted fracture base of the  proximal phalanx second toe.   PATIENT SURVEYS:  FOTO 61%   COGNITION:          Overall cognitive status: Within functional limits for tasks assessed                        SENSATION:          Light touch: Appears intact             POSTURE:   Neutral arch height R, Pes planus L   PALPATION: No tenderness R foot   LE AROM/PROM:   A/PROM Right 03/26/2021 Left 03/26/2021  Hip flexion      Hip extension      Hip abduction      Hip adduction      Hip internal rotation      Hip external rotation      Knee flexion      Knee extension      Ankle dorsiflexion 5 5  Ankle plantarflexion 35 33  Ankle inversion 30 40  Ankle eversion 30 25   (Blank rows =  not tested)   LE MMT:   MMT Right 03/26/2021 Left 03/26/2021  Hip flexion 4+ 4+  Hip extension 4+ 4+  Hip abduction 4+ 4+  Hip adduction 5 5  Hip internal rotation 5 5  Hip external rotation 4+ 4+  Knee flexion 5 5  Knee extension 5 5  Ankle dorsiflexion 5 5  Ankle plantarflexion 4+ 4+  Ankle inversion 4+ 4+  Ankle eversion 4+ 4+   (Blank rows = not tested)     FUNCTIONAL TESTS:  5 times sit to stand: 20.8 2 minute walk test: 471 ft Berg Balance Scale: 49/56   GAIT: Distance walked: Within the clinic Assistive device utilized: None Level of assistance: Complete Independence Comments: A heal to toe pattern,min L LE medial rotation   [facility] Adult PT Treatment:                                                DATE: 04/10/21 Therapeutic Exercise: NuStep 66mins, L5, LE   Gastro stretch, x2, 20 sec, L and R Standing ankle DF/PF x10 at counter x10 STS x 2 with cues for controlled descent (added to HEP)  Therapeutic Activity: SLS, x2,  30", L and R  (6-8 sec best bilateral ) Narrow stance on foam - EC Narrow stance on foam- Eo with head turns  Tandem stance  30 sec each  Tandem stance on foam EO    [facility] Adult PT Treatment:                                                DATE: 04/07/21 Therapeutic Exercise: NuStep 44mins, L7, UE/LE   Gastro stretch, x2, 20 sec, L and R Bridging 2x10, 5" Supine hip clam GTB 2x10 3"   Towel scrunches 2x10 Standing ankle DF/PF x10 at counter x10 STS  Therapeutic Activity:  SLS, x2, 30", L and R DLS c eyes closed, normal stance and narrow stance,x2 each  Self Care: Updated HEP     [facility] Adult PT Treatment:                                                DATE: 04/03/21 Therapeutic Exercise: Bridging 2x10, 5" Supine hip clam 2x10 3"   Towel scrunches x10 Standing ankle DF/PF x10 at counter SL balance, 2x, 20 sec, R and L  Therapeutic Activity: 2MWT- 461ft BERG BALANCE Sitting to Standing: Numbers; 0-4: 4  4. Stands without using hands and stabilize independently  3. Stands independently using hands  2. Stands using hands after multiple trials  1. Min A to stand  0. Mod-Max A to stand Standing unsupported: Numbers; 0-4: 4  4. Stands safely for 2 minutes  3. Stands 2 minutes with supervision  2. Stands 30 seconds unsupported  1. Needs several tries to stand unsupported for 30 seconds  0. Unable to stand unsupported for 30 seconds Sitting unsupported: Numbers; 0-4: 4  4. Sits for 2 minutes independently  3. Sits for 2 minutes with supervision  2. Able to sit 30 seconds  1. Able to sit 10 seconds  0. Unable to  sit for 10 seconds Standing to Sitting: Numbers; 0-4: 4 4. Sits safely with minimal use of hands 3. Controls descent with hands 2. Uses back of legs against chair to control descent 1. Sits independently, but uncontrolled descent 0. Needs assistance Transfers: Numbers; 0-4: 4  4. Transfers safely with minor use of hands  3. Transfers safely definite use of  hands  2. Transfers with verbal cueing/supervision  1. Needs 1 person assist  0. Needs 2 person assist  Standing with eyes closed: Numbers; 0-4: 3  4. Stands safely for 10 seconds  3. Stands 10 seconds with supervision   2. Able to stand for 3 seconds  1. Unable to keep eyes closed for 3 seconds, but is safe  0. Needs assist to keep from falling Standing with feet together: Numbers; 0-4: 4 4. Stands for 1 minute safely 3. Stands for 1 minute with supervision 2. Unable to hold for 30 seconds  1. Needs help to attain position but can hold for 15 seconds  0. Needs help to attain position and unable to hold for 15 seconds Reaching forward with outstretched arm: Numbers; 0-4: 4  4. Reaches forward 10 inches  3. Reaches forward 5 inches  2. Reaches forward 2 inches  1. Reaches forward with supervision  0. Loses balance/requires assistace Retrieving object from the floor: Numbers; 0-4: 4 4. Able to pick up easily and safely 3. Able to pick up with supervision 2. Unable to pick up, but reaches within 1-2 inches independently 1. Unable to pick up and needs supervision 0. Unable/needs assistance to keep from falling  Turning to look behind: Numbers; 0-4: 3  4. Looks behind from both sides and weight shifts well  3. Looks behind one side only, other side less weight shift  2. Turns sideways only, maintains balance  1. Needs supervision when turning  0. Needs assistance  Turning 360 degrees: Numbers; 0-4: 3  4. Able to turn in </=4 seconds  3. Able to turn on one side in </= 4 seconds   2. Able to turn slowly, but safely  1. Needs supervision or verbal cueing  0. Needs assistance Place alternate foot on stool: Numbers; 0-4: 4 4. Completes 8 steps in 20 seconds 3. Completes 8 steps in >20 seconds 2. 4 steps without assistance/supervision 1. Completes >2 steps with minimal assist 0. Unable, needs assist to keep from falling Standing with one foot in front: Numbers; 0-4: 3  4.  Independent tandem for 30 seconds  3. Independent foot ahead for 30 seconds  2. Independent small step for 30 seconds  1. Needs help to step, but can hold for 15 seconds  0. Loses balance while standing/stepping Standing on one foot: Numbers; 0-4: 1 4. Holds >10 seconds 3. Holds 5-10 seconds 2. Holds >/=3 seconds  1. Holds <3 seconds 0. Unable  Total Score: 49/56   Self Care: Updated HEP      TODAY'S TREATMENT: Eval Towel scrunches x10 Standing ankle DF/PF x10 at counter SL balance, 2x, 20 sec, R and L       PATIENT EDUCATION:  Education details: updated HEP Person educated: Patient Education method: Explanation, Demonstration, Tactile cues, Verbal cues, and Handouts Education comprehension: verbalized understanding, returned demonstration, verbal cues required, and tactile cues required     HOME EXERCISE PROGRAM: Access Code: [email or web address] URL: [email or web address] Date: 04/10/2021 Prepared by: Hessie Diener  Exercises Seated Toe Towel Scrunches - 1 x daily - 7 x weekly - 2 sets - 15 reps  Heel Toe Raises with Counter Support - 1 x daily - 7 x weekly - 2 sets - 15 reps - 2 hold Standing Single Leg Stance with Counter Support - 1 x daily - 7 x weekly - 1 sets - 5 reps - 20 hold Tandem Stance with Support - 1 x daily - 7 x weekly - 1 sets - 5 reps - 20 hold Supine Bridge - 1 x daily - 7 x weekly - 2 sets - 10 reps - 5 hold Hooklying Clamshell with Resistance - 1 x daily - 7 x weekly - 2 sets - 10 reps - 3 hold Gastroc Stretch on Wall - 1 x daily - 7 x weekly - 1 sets - 2 reps - 20 hold Corner Balance Feet Together Eyes Closed - 1 x daily - 7 x weekly - 1 sets - 2-4 reps - 20 hold Sit to stand with control - 1 x daily - 7 x weekly - 2 sets - 10 reps      ASSESSMENT:   CLINICAL IMPRESSION: PT was completed for LE strengthening and balance activities. Completed mostly closed chain therex with good tolerance and no c/o increased pain. Worked on sit-stands  with controlled descent. She requested updated HEP.  Pt will continue to benefit from skilled PT to improve LE strength and balance for optimized functional mobility.   REHAB POTENTIAL: Good   CLINICAL DECISION MAKING: Stable/uncomplicated   EVALUATION COMPLEXITY: Low     GOALS:   SHORT TERM GOALS=LTGs     LONG TERM GOALS:    LTG Name Target Date Goal status  1 Pt will be Ind in a final HEP to maintain achieved LOF Baseline: 05/15/21 INITIAL  2 Increase bilat ankle to 10d bilat for improved ankle function and mobility Baseline: 05/15/21 INITIAL  3 Increase R ankle and hip strength to 5/5 for improved ankle function and mobility Baseline:see flow sheets 05/15/21 INITIAL  4 Increase pt's FOTO score to the predicted value of 70% Baseline:61% 05/15/21 INITIAL  5 Improve pt's 5xSTS to 15 sec or less for improved function Baseline: 05/15/21 INITIAL  6  Improve pt's Berg Balance to 52/56 score as indication of imprved functional balance Baseline:49/56 05/15/21  INITIAL    PLAN: PT FREQUENCY: 2x/week   PT DURATION: 6 weeks   PLANNED INTERVENTIONS: Therapeutic exercises, Therapeutic activity, Neuro Muscular re-education, Balance training, Gait training, Patient/Family education, Joint mobilization, Dry Needling, Electrical stimulation, Cryotherapy, Moist heat, Taping, Ultrasound, Ionotophoresis 4mg /ml Dexamethasone, and Manual therapy   PLAN FOR NEXT SESSION: Assess 2MWT and Berg and set goals; Assess response to HEP: Progress therex as indicated. Introduce balance exs with eyes closed.  Hessie Diener, PTA 04/10/21 12:50 PM Phone: [phone] Fax: [phone]

## 2021-04-14 ENCOUNTER — Other Ambulatory Visit: Payer: Self-pay

## 2021-04-14 ENCOUNTER — Ambulatory Visit: Payer: Medicare Other | Admitting: Podiatry

## 2021-04-14 ENCOUNTER — Ambulatory Visit: Payer: Medicare Other

## 2021-04-14 DIAGNOSIS — M6281 Muscle weakness (generalized): Secondary | ICD-10-CM | POA: Diagnosis not present

## 2021-04-14 DIAGNOSIS — G8929 Other chronic pain: Secondary | ICD-10-CM

## 2021-04-14 DIAGNOSIS — R2689 Other abnormalities of gait and mobility: Secondary | ICD-10-CM

## 2021-04-14 NOTE — Therapy (Signed)
OUTPATIENT PHYSICAL THERAPY TREATMENT NOTE   Patient Name: Katherine Atkins MRN: [id number] DOB:[birth year], 81 y.o., female Today's Date: 04/14/2021  PCP: Geoffry Paradise, MD REFERRING PROVIDER: Geoffry Paradise, MD   PT End of Session - 04/14/21 0725     Visit Number 5    Number of Visits 13    Date for PT Re-Evaluation 05/15/21    Authorization Type [employer] MEDICARE    Progress Note Due on Visit 10    PT Start Time 0719    PT Stop Time 0800    PT Time Calculation (min) 41 min    Activity Tolerance Patient tolerated treatment well    Behavior During Therapy [facility] for tasks assessed/performed               Past Medical History:  Diagnosis Date   Blood dyscrasia    Cataract    Fever 08/27/2013   Heart murmur    Osteoporosis    Past Surgical History:  Procedure Laterality Date   CATARACT EXTRACTION  2013   EYE SURGERY     Patient Active Problem List   Diagnosis Date Noted   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 She notes she is starting to feel better   PERTINENT HISTORY: Nondisplaced right ankle lateral malleolar fracture; Intra-articular, small avulsion fracture at the base of the proximal phalanx second digit laterally. Cateracts, osteoporosis. Fractured L LE 50 years ago in a skiing accident.   PAIN:  Are you having pain? Yes NPRS scale: 0/10,  occasionally c certain movements Pain location: Foot Pain orientation: Right  PAIN TYPE: aching Pain description: intermittent  Aggravating factors: Certain steps Relieving factors: Rest   PRECAUTIONS: None   OCCUPATION: [employer]   PLOF: Independent   PATIENT GOALS : To strengthening and improve my balance     OBJECTIVE:    DIAGNOSTIC FINDINGS:  Xray 11/27/20 R foot 2 view radiographs of the right foot shows a nondisplaced fracture base of  the tuft of the great toe as well as a comminuted fracture base of the  proximal phalanx second toe.   PATIENT SURVEYS:  FOTO 61%   COGNITION:          Overall cognitive status: Within functional limits for tasks assessed                        SENSATION:          Light touch: Appears intact             POSTURE:   Neutral arch height R, Pes planus L   PALPATION: No tenderness R foot   LE AROM/PROM:   A/PROM Right 03/26/2021 Left 03/26/2021  Hip flexion      Hip extension      Hip abduction      Hip adduction      Hip internal rotation  Hip external rotation      Knee flexion      Knee extension      Ankle dorsiflexion 5 5  Ankle plantarflexion 35 33  Ankle inversion 30 40  Ankle eversion 30 25   (Blank rows = not tested)   LE MMT:   MMT Right 03/26/2021 Left 03/26/2021 Right  Hip flexion 4+ 4+ 4+  Hip extension 4+ 4+   Hip abduction 4+ 4+ 4+  Hip adduction 5 5   Hip internal rotation 5 5   Hip external rotation 4+ 4+   Knee flexion 5 5   Knee extension 5 5   Ankle dorsiflexion 5 5   Ankle plantarflexion 4+ 4+   Ankle inversion 4+ 4+   Ankle eversion 4+ 4+    (Blank rows = not tested)     FUNCTIONAL TESTS:  5 times sit to stand: 20.8 2 minute walk test: 471 ft Berg Balance Scale: 49/56   GAIT: Distance walked: Within the clinic Assistive device utilized: None Level of assistance: Complete Independence Comments: A heal to toe pattern,min L LE medial rotation   TODAY'S TREATMENT:  [facility]  Adult PT Treatment:                                                DATE: 04/14/21  Therapeutic Exercise: NuStep 5mins, L57 LE   Gastro stretch, x2, 20 sec, L and R Wall slides, 2x10, 3" Standing ankle DF/PF x10 at counter Lateral steps, 2x10, Airex, L and R    Therapeutic Activity: Narrow stance on foam - EC, x3, 30" Narrow stance on foam- Eo with head turns, x3, 30sec Tandem stance  30 sec each x2, 30" , each Tandem stance on foam EO , x2, 30", each   [facility] Adult PT Treatment:                                                DATE: 04/10/21 Therapeutic Exercise: NuStep 5mins, L5, LE   Gastro stretch, x2, 20 sec, L and R Standing ankle DF/PF x10 at counter x10 STS x 2 with cues for controlled descent (added to HEP)  Therapeutic Activity: SLS, x2, 30", L and R  (6-8 sec best bilateral ) Narrow stance on foam - EC Narrow stance on foam- Eo with head turns  Tandem stance  30 sec each  Tandem stance on foam EO    [facility] Adult PT Treatment:                                                DATE: 04/07/21 Therapeutic Exercise: NuStep 5mins, L7, UE/LE   Gastro stretch, x2, 20 sec, L and R Bridging 2x10, 5" Supine hip clam GTB 2x10 3"   Towel scrunches 2x10 Standing ankle DF/PF x10 at counter x10 STS  Therapeutic Activity:  SLS, x2, 30", L and R DLS c eyes closed, normal stance and narrow stance,x2 each  Self Care: Updated HEP     [facility] Adult PT Treatment:  DATE: 04/03/21 Therapeutic Exercise: Bridging 2x10, 5" Supine hip clam 2x10 3"   Towel scrunches x10 Standing ankle DF/PF x10 at counter SL balance, 2x, 20 sec, R and L  Therapeutic Activity: - 423ft BERG BALANCE Sitting to Standing: Numbers; 0-4: 4  4. Stands without using hands and stabilize independently  3. Stands independently using hands  2. Stands using hands after multiple trials  1. Min A to stand  0. Mod-Max A to stand Standing unsupported: Numbers; 0-4: 4  4.  Stands safely for 2 minutes  3. Stands 2 minutes with supervision  2. Stands 30 seconds unsupported  1. Needs several tries to stand unsupported for 30 seconds  0. Unable to stand unsupported for 30 seconds Sitting unsupported: Numbers; 0-4: 4  4. Sits for 2 minutes independently  3. Sits for 2 minutes with supervision  2. Able to sit 30 seconds  1. Able to sit 10 seconds  0. Unable to sit for 10 seconds Standing to Sitting: Numbers; 0-4: 4 4. Sits safely with minimal use of hands 3. Controls descent with hands 2. Uses back of legs against chair to control descent 1. Sits independently, but uncontrolled descent 0. Needs assistance Transfers: Numbers; 0-4: 4  4. Transfers safely with minor use of hands  3. Transfers safely definite use of hands  2. Transfers with verbal cueing/supervision  1. Needs 1 person assist  0. Needs 2 person assist  Standing with eyes closed: Numbers; 0-4: 3  4. Stands safely for 10 seconds  3. Stands 10 seconds with supervision   2. Able to stand for 3 seconds  1. Unable to keep eyes closed for 3 seconds, but is safe  0. Needs assist to keep from falling Standing with feet together: Numbers; 0-4: 4 4. Stands for 1 minute safely 3. Stands for 1 minute with supervision 2. Unable to hold for 30 seconds  1. Needs help to attain position but can hold for 15 seconds  0. Needs help to attain position and unable to hold for 15 seconds Reaching forward with outstretched arm: Numbers; 0-4: 4  4. Reaches forward 10 inches  3. Reaches forward 5 inches  2. Reaches forward 2 inches  1. Reaches forward with supervision  0. Loses balance/requires assistace Retrieving object from the floor: Numbers; 0-4: 4 4. Able to pick up easily and safely 3. Able to pick up with supervision 2. Unable to pick up, but reaches within 1-2 inches independently 1. Unable to pick up and needs supervision 0. Unable/needs assistance to keep from falling  Turning to look behind:  Numbers; 0-4: 3  4. Looks behind from both sides and weight shifts well  3. Looks behind one side only, other side less weight shift  2. Turns sideways only, maintains balance  1. Needs supervision when turning  0. Needs assistance  Turning 360 degrees: Numbers; 0-4: 3  4. Able to turn in </=4 seconds  3. Able to turn on one side in </= 4 seconds   2. Able to turn slowly, but safely  1. Needs supervision or verbal cueing  0. Needs assistance Place alternate foot on stool: Numbers; 0-4: 4 4. Completes 8 steps in 20 seconds 3. Completes 8 steps in >20 seconds 2. 4 steps without assistance/supervision 1. Completes >2 steps with minimal assist 0. Unable, needs assist to keep from falling Standing with one foot in front: Numbers; 0-4: 3  4. Independent tandem for 30 seconds  3. Independent foot ahead for 30 seconds  2.  Independent small step for 30 seconds  1. Needs help to step, but can hold for 15 seconds  0. Loses balance while standing/stepping Standing on one foot: Numbers; 0-4: 1 4. Holds >10 seconds 3. Holds 5-10 seconds 2. Holds >/=3 seconds  1. Holds <3 seconds 0. Unable  Total Score: 49/56   Self Care: Updated HEP      TREATMENT: Eval Towel scrunches x10 Standing ankle DF/PF x10 at counter SL balance, 2x, 20 sec, R and L       PATIENT EDUCATION:  Education details: updated HEP Person educated: Patient Education method: Explanation, Demonstration, Tactile cues, Verbal cues, and Handouts Education comprehension: verbalized understanding, returned demonstration, verbal cues required, and tactile cues required     HOME EXERCISE PROGRAM: Access Code: [email or web address] URL: [email or web address] Date: 04/10/2021 Prepared by: Jannette Spanner  Exercises Seated Toe Towel Scrunches - 1 x daily - 7 x weekly - 2 sets - 15 reps Heel Toe Raises with Counter Support - 1 x daily - 7 x weekly - 2 sets - 15 reps - 2 hold Standing Single Leg Stance with Counter  Support - 1 x daily - 7 x weekly - 1 sets - 5 reps - 20 hold Tandem Stance with Support - 1 x daily - 7 x weekly - 1 sets - 5 reps - 20 hold Supine Bridge - 1 x daily - 7 x weekly - 2 sets - 10 reps - 5 hold Hooklying Clamshell with Resistance - 1 x daily - 7 x weekly - 2 sets - 10 reps - 3 hold Gastroc Stretch on Wall - 1 x daily - 7 x weekly - 1 sets - 2 reps - 20 hold Corner Balance Feet Together Eyes Closed - 1 x daily - 7 x weekly - 1 sets - 2-4 reps - 20 hold Sit to stand with control - 1 x daily - 7 x weekly - 2 sets - 10 reps      ASSESSMENT:   CLINICAL IMPRESSION:  PT was completed for LE strengthening, balance activities, and calf stretches. R hip strength remains unchanged at this time. Pt tolerated the session well without adverse effects.   REHAB POTENTIAL: Good   CLINICAL DECISION MAKING: Stable/uncomplicated   EVALUATION COMPLEXITY: Low     GOALS:   SHORT TERM GOALS=LTGs     LONG TERM GOALS:    LTG Name Target Date Goal status  1 Pt will be Ind in a final HEP to maintain achieved LOF Baseline: 05/15/21 INITIAL  2 Increase bilat ankle to 10d bilat for improved ankle function and mobility Baseline: 05/15/21 INITIAL  3 Increase R ankle and hip strength to 5/5 for improved ankle function and mobility. R hip flexion 4+/5, abd 4+/5 Baseline:see flow sheets 05/15/21 Ongoing  4 Increase pt's FOTO score to the predicted value of 70% Baseline:61% 05/15/21 INITIAL  5 Improve pt's 5xSTS to 15 sec or less for improved function Baseline: 05/15/21 INITIAL  6  Improve pt's Berg Balance to 52/56 score as indication of imprved functional balance Baseline:49/56 05/15/21  INITIAL    PLAN: PT FREQUENCY: 2x/week   PT DURATION: 6 weeks   PLANNED INTERVENTIONS: Therapeutic exercises, Therapeutic activity, Neuro Muscular re-education, Balance training, Gait training, Patient/Family education, Joint mobilization, Dry Needling, Electrical stimulation, Cryotherapy, Moist heat, Taping,  Ultrasound, Ionotophoresis 4mg /ml Dexamethasone, and Manual therapy   PLAN FOR NEXT SESSION: Assess response to HEP: Progress therex as indicated. Introduce balance exs with eyes closed. Reassess FOTO.  MS, PT  04/14/21 9:28 AM

## 2021-04-16 ENCOUNTER — Other Ambulatory Visit: Payer: Self-pay

## 2021-04-16 ENCOUNTER — Ambulatory Visit: Payer: Medicare Other | Admitting: Podiatry

## 2021-04-16 ENCOUNTER — Ambulatory Visit: Payer: Medicare Other

## 2021-04-16 DIAGNOSIS — G8929 Other chronic pain: Secondary | ICD-10-CM

## 2021-04-16 DIAGNOSIS — M6281 Muscle weakness (generalized): Secondary | ICD-10-CM | POA: Diagnosis not present

## 2021-04-16 DIAGNOSIS — R2689 Other abnormalities of gait and mobility: Secondary | ICD-10-CM

## 2021-04-16 NOTE — Therapy (Signed)
OUTPATIENT PHYSICAL THERAPY TREATMENT NOTE   Patient Name: Katherine Atkins MRN: [id number] DOB:06-May-1940, 81 y.o., female Today's Date: 04/16/2021  PCP: Geoffry ParadiseAronson, Richard, MD REFERRING PROVIDER: Nadara Mustarduda, Marcus V, MD   PT End of Session - 04/16/21 [phone]     Visit Number 6    Number of Visits 13    Date for PT Re-Evaluation 05/15/21    Authorization Type [employer] MEDICARE    Progress Note Due on Visit 10    PT Start Time 0718    PT Stop Time 0800    PT Time Calculation (min) 42 min    Activity Tolerance Patient tolerated treatment well    [facility] tasks assessed/performed               Past Medical History:  Diagnosis Date   Blood dyscrasia    Cataract    Fever 08/27/2013   Heart murmur    Osteoporosis    Past Surgical History:  Procedure Laterality Date   CATARACT EXTRACTION  2013   EYE SURGERY     Patient Active Problem List   Diagnosis Date Noted   Intermediate stage nonexudative age-related macular degeneration of both eyes 02/25/2020   Posterior vitreous detachment of both eyes 02/25/2020   Rash and nonspecific skin eruption 08/28/2013   Pancytopenia (HCC) 08/27/2013   Elevated liver function tests 08/27/2013   Ehrlichiosis 08/27/2013   Leukopenia 08/25/2013   HEMORRHOIDS, INTERNAL 01/05/2010   FECAL INCONTINENCE 01/05/2010    REFERRING DIAG: Pain in right foot  THERAPY DIAG:  Muscle weakness (generalized)  Balance problems  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   PERTINENT HISTORY: Nondisplaced right ankle lateral malleolar fracture; Intra-articular, small avulsion fracture at the base of the proximal phalanx second digit laterally. Cateracts, osteoporosis. Fractured L LE 50 years ago in a skiing accident.   PAIN:  Are you having pain? Yes NPRS scale: 0/10, occasionally c certain movements Pain location: Foot Pain orientation:  Right  PAIN TYPE: aching Pain description: intermittent  Aggravating factors: Certain steps Relieving factors: Rest   PRECAUTIONS: None   OCCUPATION: [employer]   PLOF: Independent   PATIENT GOALS : To strengthening and improve my balance     OBJECTIVE:    DIAGNOSTIC FINDINGS:  Xray 11/27/20 R foot 2 view radiographs of the right foot shows a nondisplaced fracture base of  the tuft of the great toe as well as a comminuted fracture base of the  proximal phalanx second toe.   PATIENT SURVEYS:  FOTO 61%, 04/16/21=64%   COGNITION:          Overall cognitive status: Within functional limits for tasks assessed                        SENSATION:          Light touch: Appears intact             POSTURE:   Neutral arch height R, Pes planus L   PALPATION: No tenderness R foot   LE AROM/PROM:   A/PROM Right 03/26/2021 Left 03/26/2021  Hip flexion      Hip extension      Hip abduction      Hip adduction      Hip internal rotation      Hip external rotation      Knee flexion  Knee extension      Ankle dorsiflexion 5 5  Ankle plantarflexion 35 33  Ankle inversion 30 40  Ankle eversion 30 25   (Blank rows = not tested)   LE MMT:   MMT Right 03/26/2021 Left 03/26/2021 Right  Hip flexion 4+ 4+ 4+  Hip extension 4+ 4+   Hip abduction 4+ 4+ 4+  Hip adduction 5 5   Hip internal rotation 5 5   Hip external rotation 4+ 4+   Knee flexion 5 5   Knee extension 5 5   Ankle dorsiflexion 5 5   Ankle plantarflexion 4+ 4+   Ankle inversion 4+ 4+   Ankle eversion 4+ 4+    (Blank rows = not tested)     FUNCTIONAL TESTS:  5 times sit to stand: 20.8; 04/16/21=17.2" 2 minute walk test: 471 ft Berg Balance Scale: 49/56   GAIT: Distance walked: Within the clinic Assistive device utilized: None Level of assistance: Complete Independence Comments: A heal to toe pattern,min L LE medial rotation   TODAY'S TREATMENT: [facility] Adult PT Treatment:                                                 DATE: 04/16/21 Therapeutic Exercise: NuStep , L6 LE/UE  STS 2x10, 1x5 Standing hip abd 2x10 4# Standing hip ext 2x10 4# Standing knee flex 2x10 4#  Therapeutic Activity: Heel to toe walking in ll bars, Marching on airex, 1 min x3 Narrow stance on foam - EC, x3, 30"  Self Care: Reviewed reassessment results of FOTO   [facility] Adult PT Treatment:                                                DATE: 04/14/21  Therapeutic Exercise: NuStep , L7 LE   Gastro stretch, x2, 20 sec, L and R Wall slides, 2x10, 3" Standing ankle DF/PF x10 at counter Lateral steps, 2x10, Airex, L and R    Therapeutic Activity: Narrow stance on foam - EC, x3, 30" Narrow stance on foam- Eo with head turns, x3, 30sec Tandem stance  30 sec each x2, 30" , each Tandem stance on foam EO , x2, 30", each   [facility] Adult PT Treatment:                                                DATE: 04/10/21 Therapeutic Exercise: NuStep , L5, LE   Gastro stretch, x2, 20 sec, L and R Standing ankle DF/PF x10 at counter x10 STS x 2 with cues for controlled descent (added to HEP)  Therapeutic Activity: SLS, x2, 30", L and R  (6-8 sec best bilateral ) Narrow stance on foam - EC Narrow stance on foam- Eo with head turns  Tandem stance  30 sec each  Tandem stance on foam EO    [facility] Adult PT Treatment:  DATE: 04/07/21 Therapeutic Exercise: NuStep , L7, UE/LE   Gastro stretch, x2, 20 sec, L and R Bridging 2x10, 5" Supine hip clam GTB 2x10 3"   Towel scrunches 2x10 Standing ankle DF/PF x10 at counter x10 STS  Therapeutic Activity:  SLS, x2, 30", L and R DLS c eyes closed, normal stance and narrow stance,x2 each  Self Care: Updated HEP    PATIENT EDUCATION:  Education details: updated HEP Person educated: Patient Education method: Explanation, Demonstration, Tactile cues, Verbal cues, and Handouts Education comprehension: verbalized  understanding, returned demonstration, verbal cues required, and tactile cues required     HOME EXERCISE PROGRAM: Access Code: [email or web address] URL: [email or web address] Date: 04/10/2021 Prepared by: Jannette Spanner  Exercises Seated Toe Towel Scrunches - 1 x daily - 7 x weekly - 2 sets - 15 reps Heel Toe Raises with Counter Support - 1 x daily - 7 x weekly - 2 sets - 15 reps - 2 hold Standing Single Leg Stance with Counter Support - 1 x daily - 7 x weekly - 1 sets - 5 reps - 20 hold Tandem Stance with Support - 1 x daily - 7 x weekly - 1 sets - 5 reps - 20 hold Supine Bridge - 1 x daily - 7 x weekly - 2 sets - 10 reps - 5 hold Hooklying Clamshell with Resistance - 1 x daily - 7 x weekly - 2 sets - 10 reps - 3 hold Gastroc Stretch on Wall - 1 x daily - 7 x weekly - 1 sets - 2 reps - 20 hold Corner Balance Feet Together Eyes Closed - 1 x daily - 7 x weekly - 1 sets - 2-4 reps - 20 hold Sit to stand with control - 1 x daily - 7 x weekly - 2 sets - 10 reps      ASSESSMENT:   CLINICAL IMPRESSION:  PT was completed for static and dynamic balance, and LE strengthening. Pt needed occasional hand touches in ll bars with heel to toe walking. FOTO and 5xSTS were reassessed with pt making progress with both goals. Pt tolerated today's PT session without adverse effects. Pt will continue to benefit from skilled PT to improve strength and balance for improved functional mobility.   REHAB POTENTIAL: Good   CLINICAL DECISION MAKING: Stable/uncomplicated   EVALUATION COMPLEXITY: Low     GOALS:   SHORT TERM GOALS=LTGs     LONG TERM GOALS:    LTG Name Target Date Goal status  1 Pt will be Ind in a final HEP to maintain achieved LOF Baseline: 05/15/21 INITIAL  2 Increase bilat ankle to 10d bilat for improved ankle function and mobility Baseline: 05/15/21 INITIAL  3 Increase R ankle and hip strength to 5/5 for improved ankle function and mobility. R hip flexion 4+/5, abd  4+/5 Baseline:see flow sheets 05/15/21 Ongoing  4 Increase pt's FOTO score to the predicted value of 70%. 04/16/21=64% Baseline:61% 05/15/21 Ongoing  5 Improve pt's 5xSTS to 15 sec or less for improved function. 04/16/21=17.2" Baseline:20.8" 05/15/21 Ongoing  6  Improve pt's Berg Balance to 52/56 score as indication of imprved functional balance Baseline:49/56 05/15/21  INITIAL    PLAN: PT FREQUENCY: 2x/week   PT DURATION: 6 weeks   PLANNED INTERVENTIONS: Therapeutic exercises, Therapeutic activity, Neuro Muscular re-education, Balance training, Gait training, Patient/Family education, Joint mobilization, Dry Needling, Electrical stimulation, Cryotherapy, Moist heat, Taping, Ultrasound, Ionotophoresis 4mg /ml Dexamethasone, and Manual therapy   PLAN FOR NEXT SESSION: Assess response to HEP: Progress therex as  indicated. Introduce balance exs with eyes closed.  Ebonie Westerlund MS, PT 04/16/21 8:17 AM

## 2021-04-21 ENCOUNTER — Ambulatory Visit: Payer: Medicare Other | Admitting: Physical Therapy

## 2021-04-21 ENCOUNTER — Encounter: Payer: Self-pay | Admitting: Physical Therapy

## 2021-04-21 ENCOUNTER — Other Ambulatory Visit: Payer: Self-pay

## 2021-04-21 DIAGNOSIS — R2689 Other abnormalities of gait and mobility: Secondary | ICD-10-CM

## 2021-04-21 DIAGNOSIS — M6281 Muscle weakness (generalized): Secondary | ICD-10-CM | POA: Diagnosis not present

## 2021-04-21 DIAGNOSIS — M79671 Pain in right foot: Secondary | ICD-10-CM

## 2021-04-21 DIAGNOSIS — G8929 Other chronic pain: Secondary | ICD-10-CM

## 2021-04-21 NOTE — Therapy (Signed)
OUTPATIENT PHYSICAL THERAPY TREATMENT NOTE   Patient Name: Katherine Atkins MRN: [id number] DOB:[birth year], 81 y.o., female Today's Date: 04/21/2021  PCP: Geoffry Paradise, MD REFERRING PROVIDER: Geoffry Paradise, MD   PT End of Session - 04/21/21 0721     Visit Number 7    Number of Visits 13    Date for PT Re-Evaluation 05/15/21    Authorization Type [employer] MEDICARE    PT Start Time 0715    PT Stop Time 0800    PT Time Calculation (min) 45 min               Past Medical History:  Diagnosis Date   Blood dyscrasia    Cataract    Fever 08/27/2013   Heart murmur    Osteoporosis    Past Surgical History:  Procedure Laterality Date   CATARACT EXTRACTION  2013   EYE SURGERY     Patient Active Problem List   Diagnosis Date Noted   Intermediate stage nonexudative age-related macular degeneration of both eyes 02/25/2020   Posterior vitreous detachment of both eyes 02/25/2020   Rash and nonspecific skin eruption 08/28/2013   Pancytopenia (HCC) 08/27/2013   Elevated liver function tests 08/27/2013   Ehrlichiosis 08/27/2013   Leukopenia 08/25/2013   HEMORRHOIDS, INTERNAL 01/05/2010   FECAL INCONTINENCE 01/05/2010    REFERRING DIAG: Pain in right foot  THERAPY DIAG:  Muscle weakness (generalized)  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    PERTINENT HISTORY: Nondisplaced right ankle lateral malleolar fracture; Intra-articular, small avulsion fracture at the base of the proximal phalanx second digit laterally. Cateracts, osteoporosis. Fractured L LE 50 years ago in a skiing accident.   PAIN:  Are you having pain? Yes NPRS scale: 0/10, occasionally c bare feet  Pain location: Foot Pain orientation: Right  PAIN TYPE: aching Pain description: intermittent  Aggravating factors: Certain steps Relieving factors: Rest   PRECAUTIONS: None   OCCUPATION: [employer]   PLOF:  Independent   PATIENT GOALS : To strengthening and improve my balance     OBJECTIVE:    DIAGNOSTIC FINDINGS:  Xray 11/27/20 R foot 2 view radiographs of the right foot shows a nondisplaced fracture base of  the tuft of the great toe as well as a comminuted fracture base of the  proximal phalanx second toe.   PATIENT SURVEYS:  FOTO 61%, 04/16/21=64%   COGNITION:          Overall cognitive status: Within functional limits for tasks assessed                        SENSATION:          Light touch: Appears intact             POSTURE:   Neutral arch height R, Pes planus L   PALPATION: No tenderness R foot   LE AROM/PROM:   A/PROM Right 03/26/2021 Left 03/26/2021 Right  04/21/21 Left  04/21/21  Hip flexion        Hip extension        Hip abduction        Hip adduction        Hip internal rotation        Hip external rotation        Knee flexion        Knee extension  Ankle dorsiflexion 5 5 7 7   Ankle plantarflexion 35 33    Ankle inversion 30 40    Ankle eversion 30 25     (Blank rows = not tested)   LE MMT:   MMT Right 03/26/2021 Left 03/26/2021 Right  Hip flexion 4+ 4+ 4+  Hip extension 4+ 4+   Hip abduction 4+ 4+ 4+  Hip adduction 5 5   Hip internal rotation 5 5   Hip external rotation 4+ 4+   Knee flexion 5 5   Knee extension 5 5   Ankle dorsiflexion 5 5   Ankle plantarflexion 4+ 4+   Ankle inversion 4+ 4+   Ankle eversion 4+ 4+    (Blank rows = not tested)     FUNCTIONAL TESTS:  5 times sit to stand: 20.8; 04/16/21=17.2" 2 minute walk test: 471 ft Berg Balance Scale: 49/56   GAIT: Distance walked: Within the clinic Assistive device utilized: None Level of assistance: Complete Independence Comments: A heal to toe pattern,min L LE medial rotation   TODAY'S TREATMENT: [facility] Adult PT Treatment:                                                DATE: 04/21/21 Therapeutic Exercise: Toe scrunches, toe yoga  STS 2x10, 7# dumbbell  Gastroc stretches 3  x 30 sec Soleus stretch 3 x 30 sec  Standing hip abd 2x10 green band   Therapeutic Activity:  Tandem stance 10 sec x 5 each foot back airex   [facility] Adult PT Treatment:                                                DATE: 04/16/21 Therapeutic Exercise: NuStep , L6 LE/UE  STS 2x10, 1x5 Standing hip abd 2x10 4# Standing hip ext 2x10 4# Standing knee flex 2x10 4#  Therapeutic Activity: Heel to toe walking in ll bars, Marching on airex, 1 min x3 Narrow stance on foam - EC, x3, 30"  Self Care: Reviewed reassessment results of FOTO   [facility] Adult PT Treatment:                                                DATE: 04/14/21  Therapeutic Exercise: NuStep , L7 LE   Gastro stretch, x2, 20 sec, L and R Wall slides, 2x10, 3" Standing ankle DF/PF x10 at counter Lateral steps, 2x10, Airex, L and R    Therapeutic Activity: Narrow stance on foam - EC, x3, 30" Narrow stance on foam- Eo with head turns, x3, 30sec Tandem stance  30 sec each x2, 30" , each Tandem stance on foam EO , x2, 30", each      PATIENT EDUCATION:  Education details: updated HEP Person educated: Patient Education method: Explanation, Demonstration, Tactile cues, Verbal cues, and Handouts Education comprehension: verbalized understanding, returned demonstration, verbal cues required, and tactile cues required     HOME EXERCISE PROGRAM: Access Code: [email or web address] URL: [email or web address] Date: 04/21/2021 Prepared by: Jannette Spanner  Exercises Seated Toe Towel Scrunches - 1 x daily - 7 x weekly - 2 sets -  15 reps Heel Toe Raises with Counter Support - 1 x daily - 7 x weekly - 2 sets - 15 reps - 2 hold Standing Single Leg Stance with Counter Support - 1 x daily - 7 x weekly - 1 sets - 5 reps - 20 hold Tandem Stance with Support - 1 x daily - 7 x weekly - 1 sets - 5 reps - 20 hold Supine Bridge - 1 x daily - 7 x weekly - 2 sets - 10 reps - 5 hold Hooklying Clamshell with Resistance - 1 x  daily - 7 x weekly - 2 sets - 10 reps - 3 hold Gastroc Stretch on Wall - 1 x daily - 7 x weekly - 1 sets - 2 reps - 20 hold Corner Balance Feet Together Eyes Closed - 1 x daily - 7 x weekly - 1 sets - 2-4 reps - 20 hold Sit to stand with control - 1 x daily - 7 x weekly - 2 sets - 10 reps Standing Hip Abduction with Resistance at Ankles and Counter Support - 1 x daily - 7 x weekly - 2 sets - 10 reps - 3 hold Standing Soleus Stretch - 1 x daily - 7 x weekly - 1 sets - 3 reps       ASSESSMENT:   CLINICAL IMPRESSION:   Pt DF measure improved from 5 to 7 degrees bilateral. Reviewed standing gastroc stretch and added soleus stretches to HEP. Continued static and dynamic balance, and LE strengthening. Added theraband to standing hip strength and updated HEP.  . Pt tolerated today's PT session without adverse effects. Pt will continue to benefit from skilled PT to improve strength and balance for improved functional mobility.   REHAB POTENTIAL: Good   CLINICAL DECISION MAKING: Stable/uncomplicated   EVALUATION COMPLEXITY: Low     GOALS:   SHORT TERM GOALS=LTGs     LONG TERM GOALS:    LTG Name Target Date Goal status  1 Pt will be Ind in a final HEP to maintain achieved LOF Baseline: 05/15/21 INITIAL  2 Increase bilat ankle to 10d bilat for improved ankle function and mobility Baseline: 05/15/21 INITIAL  3 Increase R ankle and hip strength to 5/5 for improved ankle function and mobility. R hip flexion 4+/5, abd 4+/5 Baseline:see flow sheets 05/15/21 Ongoing  4 Increase pt's FOTO score to the predicted value of 70%. 04/16/21=64% Baseline:61% 05/15/21 Ongoing  5 Improve pt's 5xSTS to 15 sec or less for improved function. 04/16/21=17.2" Baseline:20.8" 05/15/21 Ongoing  6  Improve pt's Berg Balance to 52/56 score as indication of imprved functional balance Baseline:49/56 05/15/21  INITIAL    PLAN: PT FREQUENCY: 2x/week   PT DURATION: 6 weeks   PLANNED INTERVENTIONS: Therapeutic exercises,  Therapeutic activity, Neuro Muscular re-education, Balance training, Gait training, Patient/Family education, Joint mobilization, Dry Needling, Electrical stimulation, Cryotherapy, Moist heat, Taping, Ultrasound, Ionotophoresis 4mg /ml Dexamethasone, and Manual therapy   PLAN FOR NEXT SESSION: Assess response to HEP: Progress therex as indicated. Balance, DF ROM, hip strength  , PTA 04/21/21 7:58 AM Phone: [phone] Fax: [phone]

## 2021-04-24 ENCOUNTER — Encounter: Payer: Self-pay | Admitting: Physical Therapy

## 2021-04-24 ENCOUNTER — Ambulatory Visit: Payer: Medicare Other | Admitting: Physical Therapy

## 2021-04-24 ENCOUNTER — Other Ambulatory Visit: Payer: Self-pay

## 2021-04-24 DIAGNOSIS — R2689 Other abnormalities of gait and mobility: Secondary | ICD-10-CM

## 2021-04-24 DIAGNOSIS — M6281 Muscle weakness (generalized): Secondary | ICD-10-CM | POA: Diagnosis not present

## 2021-04-24 DIAGNOSIS — M79671 Pain in right foot: Secondary | ICD-10-CM

## 2021-04-24 DIAGNOSIS — G8929 Other chronic pain: Secondary | ICD-10-CM

## 2021-04-24 NOTE — Therapy (Signed)
OUTPATIENT PHYSICAL THERAPY TREATMENT NOTE   Patient Name: Katherine Atkins MRN: [id number] DOB:02-18-1941, 81 y.o., female Today's Date: 04/24/2021  PCP: Burnard Bunting, MD REFERRING PROVIDER: Burnard Bunting, MD   PT End of Session - 04/24/21 0733     Visit Number 8    Number of Visits 13    Date for PT Re-Evaluation 05/15/21    Authorization Type [employer] MEDICARE    Progress Note Due on Visit 10    PT Start Time 0715    PT Stop Time 0757    PT Time Calculation (min) 42 min               Past Medical History:  Diagnosis Date   Blood dyscrasia    Cataract    Fever 08/27/2013   Heart murmur    Osteoporosis    Past Surgical History:  Procedure Laterality Date   CATARACT EXTRACTION  2013   EYE SURGERY     Patient Active Problem List   Diagnosis Date Noted   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    PERTINENT HISTORY: Nondisplaced right ankle lateral malleolar fracture; Intra-articular, small avulsion fracture at the base of the proximal phalanx second digit laterally. Cateracts, osteoporosis. Fractured L LE 50 years ago in a skiing accident.   PAIN:  Are you having pain? Yes NPRS scale: 0/10, occasionally c bare feet  Pain location: Foot Pain orientation: Right  PAIN TYPE: aching Pain description: intermittent  Aggravating factors: Certain steps Relieving factors: Rest   PRECAUTIONS: None    OCCUPATION: [employer]   PLOF: Independent   PATIENT GOALS : To strengthening and improve my balance     OBJECTIVE:    DIAGNOSTIC FINDINGS:  Xray 11/27/20 R foot 2 view radiographs of the right foot shows a nondisplaced fracture base of  the tuft of the great toe as well as a comminuted fracture base of the  proximal phalanx second toe.   PATIENT SURVEYS:  FOTO 61%, 04/16/21=64%   COGNITION:          Overall cognitive status: Within functional limits for tasks assessed                        SENSATION:          Light touch: Appears intact             POSTURE:   Neutral arch height R, Pes planus L   PALPATION: No tenderness R foot   LE AROM/PROM:   A/PROM Right 03/26/2021 Left 03/26/2021 Right  04/21/21 Left  04/21/21  Hip flexion        Hip extension        Hip abduction        Hip adduction        Hip internal rotation        Hip external rotation        Knee flexion  Knee extension        Ankle dorsiflexion 5 5 7 7   Ankle plantarflexion 35 33    Ankle inversion 30 40    Ankle eversion 30 25     (Blank rows = not tested)   LE MMT:   MMT Right 03/26/2021 Left 03/26/2021 Right  Hip flexion 4+ 4+ 4+  Hip extension 4+ 4+   Hip abduction 4+ 4+ 4+  Hip adduction 5 5   Hip internal rotation 5 5   Hip external rotation 4+ 4+   Knee flexion 5 5   Knee extension 5 5   Ankle dorsiflexion 5 5   Ankle plantarflexion 4+ 4+   Ankle inversion 4+ 4+   Ankle eversion 4+ 4+    (Blank rows = not tested)     FUNCTIONAL TESTS:  5 times sit to stand: 20.8; 04/16/21=17.2" 2 minute walk test: 471 ft Berg Balance Scale: 49/56   GAIT: Distance walked: Within the clinic Assistive device utilized: None Level of assistance: Complete Independence Comments: A heal to toe pattern,min L LE medial rotation    TODAY'S TREATMENT:  [facility] Adult PT Treatment:                                                DATE: 04/24/21 Therapeutic Exercise: Toe scrunches, toe yoga   Marble pick up- 1 marble STS 2x10, 8# dumbbell  Gastroc stretches 2 x 30 sec Soleus stretch 2 x 30 sec  Standing hip abd 2x10 green band Tandem stance 25-30 sec  Narrow stance EC 45 sec      [facility] Adult PT Treatment:                                                DATE: 04/21/21 Therapeutic Exercise: Toe scrunches, toe yoga  STS 2x10, 7# dumbbell  Gastroc stretches 3 x 30 sec Soleus stretch 3 x 30 sec  Standing hip abd 2x10 green band   Therapeutic Activity:  Tandem stance 10 sec x 5 each foot back airex   [facility] Adult PT Treatment:                                                DATE: 04/16/21 Therapeutic Exercise: NuStep 60mins, L6 LE/UE  STS 2x10, 1x5 Standing hip abd 2x10 4# Standing hip ext 2x10 4# Standing knee flex 2x10 4#  Therapeutic Activity: Heel to toe walking in ll bars, 34mins Marching on airex, 1 min x3 Narrow stance on foam - EC, x3, 30"  Self Care: Reviewed reassessment results of FOTO   [facility] Adult PT Treatment:                                                DATE: 04/14/21  Therapeutic Exercise: NuStep 34mins, L7 LE   Gastro stretch, x2, 20 sec, L and R Wall slides, 2x10, 3" Standing ankle DF/PF x10 at counter Lateral steps, 2x10, Airex, L and R  Therapeutic Activity: Narrow stance on foam - EC, x3, 30" Narrow stance on foam- Eo with head turns, x3, 30sec Tandem stance  30 sec each x2, 30" , each Tandem stance on foam EO , x2, 30", each      PATIENT EDUCATION:  Education details: updated HEP Person educated: Patient Education method: Explanation, Demonstration, Tactile cues, Verbal cues, and Handouts Education comprehension: verbalized understanding, returned demonstration, verbal cues required, and tactile cues required     HOME EXERCISE PROGRAM: Access Code: [email or web address] URL: [email or web address] Date: 04/24/2021 Prepared by: Hessie Diener  Exercises Seated Toe Towel Scrunches - 1 x daily - 7 x weekly - 2 sets - 15  reps Heel Toe Raises with Counter Support - 1 x daily - 7 x weekly - 2 sets - 15 reps - 2 hold Standing Single Leg Stance with Counter Support - 1 x daily - 7 x weekly - 1 sets - 5 reps - 20 hold Tandem Stance with Support - 1 x daily - 7 x weekly - 1 sets - 5 reps - 20 hold Supine Bridge - 1 x daily - 7 x weekly - 2 sets - 10 reps - 5 hold Hooklying Clamshell with Resistance - 1 x daily - 7 x weekly - 2 sets - 10 reps - 3 hold Gastroc Stretch on Wall - 1 x daily - 7 x weekly - 1 sets - 2 reps - 20 hold Corner Balance Feet Together Eyes Closed - 1 x daily - 7 x weekly - 1 sets - 2-4 reps - 20 hold Sit to stand with control - 1 x daily - 7 x weekly - 2 sets - 10 reps Standing Hip Abduction with Resistance at Ankles and Counter Support - 1 x daily - 7 x weekly - 2 sets - 10 reps - 3 hold Standing Soleus Stretch - 1 x daily - 7 x weekly - 1 sets - 3 reps Seated Marble Pick-Up with Toes - 1 x daily - 7 x weekly - 1 sets - 10 reps Toe Yoga - Alternating Great Toe and Lesser Toe Extension - 1 x daily - 7 x weekly - 2 sets - 10 reps       ASSESSMENT:   CLINICAL IMPRESSION:   Pt reports compliance with new HEP. She noted improvement in tolerance to walking on barefoot and did not need to avoid the plantar area with walking. Began marble pickup with pt able to pick up one marble. Reviewed newest HEP  Pt tolerated today's PT session without adverse effects.5x STS improved to goal MET.   Pt will continue to benefit from skilled PT to improve strength and balance for improved functional mobility.   REHAB POTENTIAL: Good   CLINICAL DECISION MAKING: Stable/uncomplicated   EVALUATION COMPLEXITY: Low     GOALS:   SHORT TERM GOALS=LTGs     LONG TERM GOALS:    LTG Name Target Date Goal status  1 Pt will be Ind in a final HEP to maintain achieved LOF Baseline: 05/15/21 INITIAL  2 Increase bilat ankle to 10d bilat for improved ankle function and mobility Baseline: 05/15/21 INITIAL  3 Increase R  ankle and hip strength to 5/5 for improved ankle function and mobility. R hip flexion 4+/5, abd 4+/5 Baseline:see flow sheets 05/15/21 Ongoing  4 Increase pt's FOTO score to the predicted value of 70%. 04/16/21=64% Baseline:61% 05/15/21 Ongoing  5 Improve pt's 5xSTS to 15 sec or less for improved function.  04/24/21=14.6 04/16/21=17.2" Baseline:20.8" 05/15/21 Met 04/24/21  6  Improve pt's Berg Balance to 52/56 score as indication of imprved functional balance Baseline:49/56 05/15/21  INITIAL    PLAN: PT FREQUENCY: 2x/week   PT DURATION: 6 weeks   PLANNED INTERVENTIONS: Therapeutic exercises, Therapeutic activity, Neuro Muscular re-education, Balance training, Gait training, Patient/Family education, Joint mobilization, Dry Needling, Electrical stimulation, Cryotherapy, Moist heat, Taping, Ultrasound, Ionotophoresis 4mg /ml Dexamethasone, and Manual therapy   PLAN FOR NEXT SESSION: Check goals Assess response to HEP: Progress therex as indicated. Balance, DF ROM, hip strength  Hessie Diener, PTA 04/24/21 7:36 AM Phone: [phone] Fax: [phone]

## 2021-04-28 ENCOUNTER — Other Ambulatory Visit: Payer: Self-pay

## 2021-04-28 ENCOUNTER — Ambulatory Visit: Payer: Medicare Other

## 2021-04-28 DIAGNOSIS — M6281 Muscle weakness (generalized): Secondary | ICD-10-CM | POA: Diagnosis not present

## 2021-04-28 DIAGNOSIS — G8929 Other chronic pain: Secondary | ICD-10-CM

## 2021-04-28 DIAGNOSIS — R2689 Other abnormalities of gait and mobility: Secondary | ICD-10-CM

## 2021-04-28 NOTE — Therapy (Signed)
OUTPATIENT PHYSICAL THERAPY TREATMENT NOTE   Patient Name: Katherine Atkins MRN: [id number] DOB:1940-03-18, 81 y.o., female Today's Date: 04/28/2021  PCP: Burnard Bunting, MD REFERRING PROVIDER: Burnard Bunting, MD   PT End of Session - 04/28/21 0728     Visit Number 9    Number of Visits 13    Date for PT Re-Evaluation 05/15/21    Authorization Type [employer] MEDICARE    Progress Note Due on Visit 10    PT Start Time 0718    PT Stop Time 0800    PT Time Calculation (min) 42 min    Activity Tolerance Patient tolerated treatment well    Behavior During Therapy [facility] for tasks assessed/performed                Past Medical History:  Diagnosis Date   Blood dyscrasia    Cataract    Fever 08/27/2013   Heart murmur    Osteoporosis    Past Surgical History:  Procedure Laterality Date   CATARACT EXTRACTION  2013   EYE SURGERY     Patient Active Problem List   Diagnosis Date Noted   Intermediate stage nonexudative age-related macular degeneration of both eyes 02/25/2020   Posterior vitreous detachment of both eyes 02/25/2020   Rash and nonspecific skin eruption 08/28/2013   Pancytopenia ([place]) 08/27/2013   Elevated liver function tests 63/87/5643   Ehrlichiosis 32/95/1884   Leukopenia 08/25/2013   HEMORRHOIDS, INTERNAL 01/05/2010   FECAL INCONTINENCE 01/05/2010    REFERRING DIAG: Pain in right foot  THERAPY DIAG:  Muscle weakness (generalized)  Chronic pain in right foot  Balance problems  SUBJECTIVE:    SUBJECTIVE STATEMENT: My legs are getting stronger.    PERTINENT HISTORY: Nondisplaced right ankle lateral malleolar fracture; Intra-articular, small avulsion fracture at the base of the proximal phalanx second digit laterally. Cateracts, osteoporosis. Fractured L LE 50 years ago in a skiing accident.   PAIN:  Are you having pain? Yes NPRS scale: 0/10, occasionally c bare feet  Pain location: Foot Pain orientation: Right  PAIN TYPE: aching Pain  description: intermittent  Aggravating factors: Certain steps Relieving factors: Rest   PRECAUTIONS: None   OCCUPATION: [employer]   PLOF: Independent   PATIENT GOALS : To strengthening and improve my balance     OBJECTIVE:    DIAGNOSTIC FINDINGS:  Xray 11/27/20 R foot 2 view radiographs of the right foot shows a nondisplaced fracture base of  the tuft of the great toe as well as a comminuted fracture base of the  proximal phalanx second toe.   PATIENT SURVEYS:  FOTO 61%, 04/16/21=64%   COGNITION:          Overall cognitive status: Within functional limits for tasks assessed                        SENSATION:          Light touch: Appears intact             POSTURE:   Neutral arch height R, Pes planus L   PALPATION: No tenderness R foot   LE AROM/PROM:   A/PROM Right 03/26/2021 Left 03/26/2021 Right  04/21/21 Left  04/21/21  Hip flexion        Hip extension        Hip abduction        Hip adduction        Hip internal rotation        Hip external  rotation        Knee flexion        Knee extension        Ankle dorsiflexion _0 Ankle plantarflexion 35 33    Ankle inversion 30 40    Ankle eversion 30 25     (Blank rows = not tested)   LE MMT:   MMT Right 03/26/2021 Left 03/26/2021 Right  Hip flexion 4+ 4+ 4+  Hip extension 4+ 4+   Hip abduction 4+ 4+ 4+  Hip adduction 5 5   Hip internal rotation 5 5   Hip external rotation 4+ 4+   Knee flexion 5 5   Knee extension 5 5   Ankle dorsiflexion 5 5   Ankle plantarflexion 4+ 4+   Ankle inversion 4+ 4+   Ankle eversion 4+ 4+    (Blank rows = not tested)     FUNCTIONAL TESTS:  5 times sit to stand: 20.8; 04/16/21=17.2" 2 minute walk test: 471 ft Berg Balance Scale: 49/56   GAIT: Distance walked: Within the clinic Assistive device utilized: None Level of assistance: Complete Independence Comments: A heal to toe pattern,min L LE medial rotation    TODAY'S TREATMENT:  [facility] Adult PT Treatment:                                                 DATE: 04/28/21 Therapeutic Exercise: NuStep 38mns, L5 UE/LE Cybex knee ext, 3x10, 35# Cybex leg press, 3x10, 80# Cybex hip abd, 3x10, 22.5#, L and R Rocker board, A/P and lateral 20x each, hand assist as needed Therapeutic Activity: On airex: narrow base EO and EC (hand assist as needed) . Tandem stance (hand assist as needed)   [facility] PT Treatment:                                                DATE: 04/24/21 Therapeutic Exercise: Toe scrunches, toe yoga  Marble pick up- 1 marble STS 2x10, 8# dumbbell  Gastroc stretches 2 x 30 sec Soleus stretch 2 x 30 sec  Standing hip abd 2x10 green band Tandem stance 25-30 sec  Narrow stance EC 45 sec   [facility] Adult PT Treatment:                                                DATE: 04/21/21 Therapeutic Exercise: Toe scrunches, toe yoga  STS 2x10, 7# dumbbell  Gastroc stretches 3 x 30 sec Soleus stretch 3 x 30 sec  Standing hip abd 2x10 green band   Therapeutic Activity:  Tandem stance 10 sec x 5 each foot back airex   [facility] Adult PT Treatment:                                                DATE: 04/16/21 Therapeutic Exercise: NuStep 512ms, L6 LE/UE  STS 2x10, 1x5 Standing hip abd 2x10 4# Standing hip ext 2x10 4# Standing knee flex 2x10  4#  Therapeutic Activity: Heel to toe walking in ll bars, 61mns Marching on airex, 1 min x3 Narrow stance on foam - EC, x3, 30"  Self Care: Reviewed reassessment results of FOTO   PATIENT EDUCATION:  Education details: updated HEP Person educated: Patient Education method: Explanation, Demonstration, Tactile cues, Verbal cues, and Handouts Education comprehension: verbalized understanding, returned demonstration, verbal cues required, and tactile cues required     HOME EXERCISE PROGRAM: Access Code: [email or web address] URL: [email or web address] Date: 04/24/2021 Prepared by: JHessie Diener Exercises Seated Toe Towel Scrunches - 1 x  daily - 7 x weekly - 2 sets - 15 reps Heel Toe Raises with Counter Support - 1 x daily - 7 x weekly - 2 sets - 15 reps - 2 hold Standing Single Leg Stance with Counter Support - 1 x daily - 7 x weekly - 1 sets - 5 reps - 20 hold Tandem Stance with Support - 1 x daily - 7 x weekly - 1 sets - 5 reps - 20 hold Supine Bridge - 1 x daily - 7 x weekly - 2 sets - 10 reps - 5 hold Hooklying Clamshell with Resistance - 1 x daily - 7 x weekly - 2 sets - 10 reps - 3 hold Gastroc Stretch on Wall - 1 x daily - 7 x weekly - 1 sets - 2 reps - 20 hold Corner Balance Feet Together Eyes Closed - 1 x daily - 7 x weekly - 1 sets - 2-4 reps - 20 hold Sit to stand with control - 1 x daily - 7 x weekly - 2 sets - 10 reps Standing Hip Abduction with Resistance at Ankles and Counter Support - 1 x daily - 7 x weekly - 2 sets - 10 reps - 3 hold Standing Soleus Stretch - 1 x daily - 7 x weekly - 1 sets - 3 reps Seated Marble Pick-Up with Toes - 1 x daily - 7 x weekly - 1 sets - 10 reps Toe Yoga - Alternating Great Toe and Lesser Toe Extension - 1 x daily - 7 x weekly - 2 sets - 10 reps       ASSESSMENT:   CLINICAL IMPRESSION:   With completing standing balance exs, pt. moves into an anterior pelvic tilt with forward hips indicating hip extensor weakness. Today's PT session was completing to address LE strength, esp hip strength, and balance. Pt tolerated today's session without adverse effects.   IMPAIRMENTS:    Decreased balance, difficulty walking, decreased ROM, decreased strength, and pain.    GOALS:   SHORT TERM GOALS=LTGs     LONG TERM GOALS:    LTG Name Target Date Goal status  1 Pt will be Ind in a final HEP to maintain achieved LOF Baseline: 05/15/21 INITIAL  2 Increase bilat ankle to 10d bilat for improved ankle function and mobility Baseline: 05/15/21 INITIAL  3 Increase R ankle and hip strength to 5/5 for improved ankle function and mobility. R hip flexion 4+/5, abd 4+/5 Baseline:see flow sheets  05/15/21 Ongoing  4 Increase pt's FOTO score to the predicted value of 70%. 04/16/21=64% Baseline:61% 05/15/21 Ongoing  5 Improve pt's 5xSTS to 15 sec or less for improved function.  04/24/21=14.6 04/16/21=17.2" Baseline:20.8" 05/15/21 Met 04/24/21  6  Improve pt's Berg Balance to 52/56 score as indication of imprved functional balance Baseline:49/56 05/15/21  INITIAL    PLAN: PT FREQUENCY: 2x/week   PT DURATION: 6 weeks   PLANNED INTERVENTIONS: Therapeutic exercises, Therapeutic activity, Neuro  Muscular re-education, Balance training, Gait training, Patient/Family education, Joint mobilization, Dry Needling, Electrical stimulation, Cryotherapy, Moist heat, Taping, Ultrasound, Ionotophoresis 69m/ml Dexamethasone, and Manual therapy   PLAN FOR NEXT SESSION: Check goals Assess response to HEP: Progress therex as indicated. Balance, DF ROM, hip strength. Reassess FOTO score.  Johanthan Kneeland MS, PT 04/28/21 10:40 AM

## 2021-05-05 ENCOUNTER — Encounter: Payer: Self-pay | Admitting: Physical Therapy

## 2021-05-05 ENCOUNTER — Other Ambulatory Visit: Payer: Self-pay

## 2021-05-05 ENCOUNTER — Ambulatory Visit: Payer: Medicare Other | Admitting: Physical Therapy

## 2021-05-05 DIAGNOSIS — M79671 Pain in right foot: Secondary | ICD-10-CM

## 2021-05-05 DIAGNOSIS — M6281 Muscle weakness (generalized): Secondary | ICD-10-CM | POA: Diagnosis not present

## 2021-05-05 DIAGNOSIS — R2689 Other abnormalities of gait and mobility: Secondary | ICD-10-CM

## 2021-05-05 DIAGNOSIS — G8929 Other chronic pain: Secondary | ICD-10-CM

## 2021-05-05 NOTE — Therapy (Addendum)
OUTPATIENT PHYSICAL THERAPY TREATMENT NOTE/Discharge   Patient Name: Katherine Atkins MRN: [id number] DOB:Nov 07, 1940, 81 y.o., female Today's Date: 05/05/2021  PCP: Burnard Bunting, MD REFERRING PROVIDER: Burnard Bunting, MD   PT End of Session - 05/05/21 0721     Visit Number 10    Number of Visits 13    Date for PT Re-Evaluation 05/15/21    Authorization Type [employer] MEDICARE    Progress Note Due on Visit 10    PT Start Time 0715    PT Stop Time 0800    PT Time Calculation (min) 45 min                Past Medical History:  Diagnosis Date   Blood dyscrasia    Cataract    Fever 08/27/2013   Heart murmur    Osteoporosis    Past Surgical History:  Procedure Laterality Date   CATARACT EXTRACTION  2013   EYE SURGERY     Patient Active Problem List   Diagnosis Date Noted   Intermediate stage nonexudative age-related macular degeneration of both eyes 02/25/2020   Posterior vitreous detachment of both eyes 02/25/2020   Rash and nonspecific skin eruption 08/28/2013   Pancytopenia ([place]) 08/27/2013   Elevated liver function tests 65/53/7482   Ehrlichiosis 70/78/6754   Leukopenia 08/25/2013   HEMORRHOIDS, INTERNAL 01/05/2010   FECAL INCONTINENCE 01/05/2010    REFERRING DIAG: Pain in right foot  THERAPY DIAG:  Muscle weakness (generalized)  Chronic pain in right foot  Balance problems  SUBJECTIVE:    SUBJECTIVE STATEMENT: The foot is fine. I will shovel some gravel today. I do not think I will have any difficulty. I feel a lot stronger.     PERTINENT HISTORY: Nondisplaced right ankle lateral malleolar fracture; Intra-articular, small avulsion fracture at the base of the proximal phalanx second digit laterally. Cateracts, osteoporosis. Fractured L LE 50 years ago in a skiing accident.   PAIN:  Are you having pain? Yes NPRS scale: 0/10, occasionally c bare feet  Pain location: Foot Pain orientation: Right  PAIN TYPE: aching Pain description: intermittent   Aggravating factors: Certain steps Relieving factors: Rest   PRECAUTIONS: None   OCCUPATION: [employer]   PLOF: Independent   PATIENT GOALS : To strengthening and improve my balance     OBJECTIVE:    DIAGNOSTIC FINDINGS:  Xray 11/27/20 R foot 2 view radiographs of the right foot shows a nondisplaced fracture base of  the tuft of the great toe as well as a comminuted fracture base of the  proximal phalanx second toe.   PATIENT SURVEYS:  FOTO 61%, 04/16/21=64%; 05/05/21: 81%   COGNITION:          Overall cognitive status: Within functional limits for tasks assessed                        SENSATION:          Light touch: Appears intact             POSTURE:   Neutral arch height R, Pes planus L   PALPATION: No tenderness R foot   LE AROM/PROM:   A/PROM Right 03/26/2021 Left 03/26/2021 Right  04/21/21 Left  04/21/21 Right 05/05/21 Left 05/05/21  Hip flexion          Hip extension          Hip abduction          Hip adduction  Hip internal rotation          Hip external rotation          Knee flexion          Knee extension          Ankle dorsiflexion _0 Ankle plantarflexion 35 33      Ankle inversion 30 40      Ankle eversion 30 25       (Blank rows = not tested)   LE MMT:   MMT Right 03/26/2021 Left 03/26/2021 Right  Hip flexion 4+ 4+ 4+  Hip extension 4+ 4+ 4+  Hip abduction 4+ 4+ 4+  Hip adduction 5 5   Hip internal rotation 5 5   Hip external rotation 4+ 4+   Knee flexion 5 5   Knee extension 5 5   Ankle dorsiflexion _1 Ankle plantarflexion 4+ 4+   Ankle inversion 4+ 4+ 5  Ankle eversion 4+ 4+ 4+   (Blank rows = not tested)     FUNCTIONAL TESTS:  5 times sit to stand: 20.8; 04/16/21=17.2" 05/05/21: 15.4 2 minute walk test: 471 ft Berg Balance Scale: 49/56; 05/05/21 : 53/56   GAIT: Distance walked: Within the clinic Assistive device utilized: None Level of assistance: Complete Independence  05/05/21 BERG  BALANCE  Sitting to Standing: Numbers; 0-4: 4  4. Stands without using hands and stabilize independently  3. Stands independently using hands  2. Stands using hands after multiple trials  1. Min A to stand  0. Mod-Max A to stand Standing unsupported: Numbers; 0-4: 4  4. Stands safely for 2 minutes  3. Stands 2 minutes with supervision  2. Stands 30 seconds unsupported  1. Needs several tries to stand unsupported for 30 seconds  0. Unable to stand unsupported for 30 seconds Sitting unsupported: Numbers; 0-4: 4  4. Sits for 2 minutes independently  3. Sits for 2 minutes with supervision  2. Able to sit 30 seconds  1. Able to sit 10 seconds  0. Unable to sit for 10 seconds Standing to Sitting: Numbers; 0-4: 4 4. Sits safely with minimal use of hands 3. Controls descent with hands 2. Uses back of legs against chair to control descent 1. Sits independently, but uncontrolled descent 0. Needs assistance Transfers: Numbers; 0-4: 4  4. Transfers safely with minor use of hands  3. Transfers safely definite use of hands  2. Transfers with verbal cueing/supervision  1. Needs 1 person assist  0. Needs 2 person assist  Standing with eyes closed: Numbers; 0-4: 4  4. Stands safely for 10 seconds  3. Stands 10 seconds with supervision   2. Able to stand for 3 seconds  1. Unable to keep eyes closed for 3 seconds, but is safe  0. Needs assist to keep from falling Standing with feet together: Numbers; 0-4: 4 4. Stands for 1 minute safely 3. Stands for 1 minute with supervision 2. Unable to hold for 30 seconds  1. Needs help to attain position but can hold for 15 seconds  0. Needs help to attain position and unable to hold for 15 seconds Reaching forward with outstretched arm: Numbers; 0-4: 4  4. Reaches forward 10 inches  3. Reaches forward 5 inches  2. Reaches forward 2 inches  1. Reaches forward with supervision  0. Loses balance/requires assistace Retrieving object from the floor:  Numbers; 0-4: 4 4. Able to pick up easily and safely 3. Able  to pick up with supervision 2. Unable to pick up, but reaches within 1-2 inches independently 1. Unable to pick up and needs supervision 0. Unable/needs assistance to keep from falling  Turning to look behind: Numbers; 0-4: 4  4. Looks behind from both sides and weight shifts well  3. Looks behind one side only, other side less weight shift  2. Turns sideways only, maintains balance  1. Needs supervision when turning  0. Needs assistance  Turning 360 degrees: Numbers; 0-4: 4  4. Able to turn in </=4 seconds  3. Able to turn on one side in </= 4 seconds   2. Able to turn slowly, but safely  1. Needs supervision or verbal cueing  0. Needs assistance Place alternate foot on stool: Numbers; 0-4: 4 4. Completes 8 steps in 20 seconds 3. Completes 8 steps in >20 seconds 2. 4 steps without assistance/supervision 1. Completes >2 steps with minimal assist 0. Unable, needs assist to keep from falling Standing with one foot in front: Numbers; 0-4: 3  4. Independent tandem for 30 seconds  3. Independent foot ahead for 30 seconds  2. Independent small step for 30 seconds  1. Needs help to step, but can hold for 15 seconds  0. Loses balance while standing/stepping Standing on one foot: Numbers; 0-4: 2 4. Holds >10 seconds 3. Holds 5-10 seconds 2. Holds >/=3 seconds  1. Holds <3 seconds 0. Unable   Total Score: 53/56    TODAY'S TREATMENT:  [facility] Adult PT Treatment:                                                DATE: 05/05/21 Therapeutic Exercise: NuStep 14mns, L5 LE Bridge x15 Clam x 20 Sit-stand x10 Tandem stance trials  SLS trials  Gastroc and soleus stretch Therapeutic Activity: On airex: tandem stance: intermittent hands assist   [facility] Adult PT Treatment:                                                DATE: 04/28/21 Therapeutic Exercise: NuStep 59ms, L5 UE/LE Cybex knee ext, 3x10, 35# Cybex leg press, 3x10,  80# Cybex hip abd, 3x10, 22.5#, L and R Rocker board, A/P and lateral 20x each, hand assist as needed Therapeutic Activity: On airex: narrow base EO and EC (hand assist as needed) . Tandem stance (hand assist as needed)   [facility] PT Treatment:                                                DATE: 04/24/21 Therapeutic Exercise: Toe scrunches, toe yoga  Marble pick up- 1 marble STS 2x10, 8# dumbbell  Gastroc stretches 2 x 30 sec Soleus stretch 2 x 30 sec  Standing hip abd 2x10 green band Tandem stance 25-30 sec  Narrow stance EC 45 sec   [facility] Adult PT Treatment:  DATE: 04/21/21 Therapeutic Exercise: Toe scrunches, toe yoga  STS 2x10, 7# dumbbell  Gastroc stretches 3 x 30 sec Soleus stretch 3 x 30 sec  Standing hip abd 2x10 green band   Therapeutic Activity:  Tandem stance 10 sec x 5 each foot back airex   [facility] Adult PT Treatment:                                                DATE: 04/16/21 Therapeutic Exercise: NuStep 12mns, L6 LE/UE  STS 2x10, 1x5 Standing hip abd 2x10 4# Standing hip ext 2x10 4# Standing knee flex 2x10 4#  Therapeutic Activity: Heel to toe walking in ll bars, 269ms Marching on airex, 1 min x3 Narrow stance on foam - EC, x3, 30"  Self Care: Reviewed reassessment results of FOTO   PATIENT EDUCATION:  Education details: reviewed final HEP Person educated: Patient Education method: Explanation, Demonstration, Tactile cues, Verbal cues, and Handouts Education comprehension: verbalized understanding, returned demonstration, verbal cues required, and tactile cues required     HOME EXERCISE PROGRAM: Access Code: [email or web address] URL: [email or web address] Date: 04/24/2021 Prepared by: JeHessie DienerExercises Seated Toe Towel Scrunches - 1 x daily - 7 x weekly - 2 sets - 15 reps Heel Toe Raises with Counter Support - 1 x daily - 7 x weekly - 2 sets - 15 reps - 2 hold Standing Single Leg  Stance with Counter Support - 1 x daily - 7 x weekly - 1 sets - 5 reps - 20 hold Tandem Stance with Support - 1 x daily - 7 x weekly - 1 sets - 5 reps - 20 hold Supine Bridge - 1 x daily - 7 x weekly - 2 sets - 10 reps - 5 hold Hooklying Clamshell with Resistance - 1 x daily - 7 x weekly - 2 sets - 10 reps - 3 hold Gastroc Stretch on Wall - 1 x daily - 7 x weekly - 1 sets - 2 reps - 20 hold Corner Balance Feet Together Eyes Closed - 1 x daily - 7 x weekly - 1 sets - 2-4 reps - 20 hold Sit to stand with control - 1 x daily - 7 x weekly - 2 sets - 10 reps Standing Hip Abduction with Resistance at Ankles and Counter Support - 1 x daily - 7 x weekly - 2 sets - 10 reps - 3 hold Standing Soleus Stretch - 1 x daily - 7 x weekly - 1 sets - 3 reps Seated Marble Pick-Up with Toes - 1 x daily - 7 x weekly - 1 sets - 10 reps Toe Yoga - Alternating Great Toe and Lesser Toe Extension - 1 x daily - 7 x weekly - 2 sets - 10 reps       ASSESSMENT:   CLINICAL IMPRESSION:   Pt hip and right ankle strength grossly 4+/5 which partially mets her LTG#3. Her BERG improved to 53/56, meeting LTG# 6. Her DF improved to 8 degrees bilateral. She has met or partially met all LTGS and reports no foot pain. Her foto score improved beyond prediction. She is agreeable to discharge and plans to continue HEP and [facility]    IMPAIRMENTS:    Decreased balance, difficulty walking, decreased ROM, decreased strength, and pain.    GOALS:   SHORT TERM GOALS=LTGs  LONG TERM GOALS:    LTG Name Target Date Goal status  1 Pt will be Ind in a final HEP to maintain achieved LOF Baseline: 05/15/21 Met  05/05/21  2 Increase bilat ankle to 10d bilat for improved ankle function and mobility Baseline: 05/15/21 Partially Met  05/05/21  3 Increase R ankle and hip strength to 5/5 for improved ankle function and mobility. R hip flexion 4+/5, abd 4+/5 Baseline:see flow sheets 05/15/21 Partially met 05/05/21  4  Increase pt's FOTO score to the predicted value of 70%. 04/16/21=64% Baseline:61% 05/15/21 Met 05/05/21  5 Improve pt's 5xSTS to 15 sec or less for improved function.  04/24/21=14.6 04/16/21=17.2" Baseline:20.8" 05/15/21 Met 04/24/21  6  Improve pt's Berg Balance to 52/56 score as indication of imprved functional balance Baseline:49/56 05/05/21: 53/56 05/15/21  Met 05/05/21   PLAN: PT FREQUENCY: 2x/week   PT DURATION: 6 weeks   PLANNED INTERVENTIONS: Therapeutic exercises, Therapeutic activity, Neuro Muscular re-education, Balance training, Gait training, Patient/Family education, Joint mobilization, Dry Needling, Electrical stimulation, Cryotherapy, Moist heat, Taping, Ultrasound, Ionotophoresis 17m/ml Dexamethasone, and Manual therapy   PLAN FOR NEXT SESSION: N/A discharge today.   JHessie Diener PTA 05/05/21 8:04 AM Phone: [phone]: [phone]  PHYSICAL THERAPY DISCHARGE SUMMARY  Visits from Start of Care: 10  Current functional level related to goals / functional outcomes: See above   Remaining deficits: See above   Education / Equipment: HEP   Patient agrees to discharge. Patient goals were met. Patient is being discharged due to being pleased with the current functional level.  Allen Ralls MS, PT 05/05/21 6:07 PM

## 2021-06-17 ENCOUNTER — Other Ambulatory Visit (HOSPITAL_COMMUNITY): Payer: Self-pay | Admitting: *Deleted

## 2021-06-18 ENCOUNTER — Ambulatory Visit (HOSPITAL_COMMUNITY)
Admission: RE | Admit: 2021-06-18 | Discharge: 2021-06-18 | Disposition: A | Discharge status: Home / Self Care | Payer: Medicare Other | Source: Ambulatory Visit | Attending: Internal Medicine | Admitting: Internal Medicine

## 2021-06-18 DIAGNOSIS — M810 Age-related osteoporosis without current pathological fracture: Secondary | ICD-10-CM | POA: Diagnosis not present

## 2021-06-18 MED ORDER — DENOSUMAB 60 MG/ML ~~LOC~~ SOSY
PREFILLED_SYRINGE | SUBCUTANEOUS | Status: AC
  Administered 2021-06-18: 60 mg via SUBCUTANEOUS
  Filled 2021-06-18: qty 1

## 2021-06-18 MED ORDER — DENOSUMAB 60 MG/ML ~~LOC~~ SOSY: 60.0000 mg | PREFILLED_SYRINGE | Freq: Once | SUBCUTANEOUS | Status: AC

## 2021-08-18 ENCOUNTER — Ambulatory Visit (INDEPENDENT_AMBULATORY_CARE_PROVIDER_SITE_OTHER): Payer: Medicare Other | Admitting: Ophthalmology

## 2021-08-18 ENCOUNTER — Encounter (INDEPENDENT_AMBULATORY_CARE_PROVIDER_SITE_OTHER): Payer: Self-pay | Admitting: Ophthalmology

## 2021-08-18 DIAGNOSIS — H353211 Exudative age-related macular degeneration, right eye, with active choroidal neovascularization: Secondary | ICD-10-CM | POA: Diagnosis not present

## 2021-08-18 DIAGNOSIS — H353132 Nonexudative age-related macular degeneration, bilateral, intermediate dry stage: Secondary | ICD-10-CM | POA: Diagnosis not present

## 2021-08-18 MED ORDER — BEVACIZUMAB 2.5 MG/0.1ML IZ SOSY
2.5000 mg | PREFILLED_SYRINGE | INTRAVITREAL | Status: AC | PRN
  Administered 2021-08-18: 2.5 mg via INTRAVITREAL

## 2021-08-18 NOTE — Assessment & Plan Note (Signed)
No sign of CNVM OS 

## 2021-08-18 NOTE — Progress Notes (Signed)
08/18/2021     CHIEF COMPLAINT Patient presents for  Chief Complaint  Patient presents with   Macular Degeneration      HISTORY OF PRESENT ILLNESS: Katherine Atkins is a 81 y.o. female who presents to the clinic today for:   HPI   EWIP- New CNV, age-related, OD, REF BY DR. Charlotte SanesMcCUEN. Pt was last seen 5 MOS here. Pt pt reports, "Around feb, I noticed a change. I noticed I need more light. When I read something, I need good light." Pt denies FOL but sees floaters in left eye. Last edited by Edmon Crapeankin, Elek Holderness A, MD on 08/18/2021 10:50 AM.      Referring physician: Geoffry ParadiseAronson, Richard, MD [address] [place] [id number]  HISTORICAL INFORMATION:   Selected notes from the MEDICAL RECORD NUMBER       CURRENT MEDICATIONS: No current outpatient medications on file. (Ophthalmic Drugs)   No current facility-administered medications for this visit. (Ophthalmic Drugs)   No current outpatient medications on file. (Other)   No current facility-administered medications for this visit. (Other)      REVIEW OF SYSTEMS: ROS   Negative for: Constitutional, Gastrointestinal, Neurological, Skin, Genitourinary, Musculoskeletal, HENT, Endocrine, Cardiovascular, Eyes, Respiratory, Psychiatric, Allergic/Imm, Heme/Lymph Last edited by Angeline SlimMa, San on 08/18/2021 10:07 AM.       ALLERGIES No Known Allergies  PAST MEDICAL HISTORY Past Medical History:  Diagnosis Date   Blood dyscrasia    Cataract    Fever 08/27/2013   Heart murmur    Osteoporosis    Past Surgical History:  Procedure Laterality Date   CATARACT EXTRACTION  2013   EYE SURGERY      FAMILY HISTORY Family History  Problem Relation Age of Onset   Kidney disease Father    Heart disease Maternal Grandfather    Heart disease Paternal Grandmother     SOCIAL HISTORY Social History   Tobacco Use   Smoking status: Never   Smokeless tobacco: Never  Substance Use Topics   Alcohol use: No   Drug use: No          OPHTHALMIC EXAM:  Base Eye Exam     Visual Acuity (ETDRS)       Right Left   Dist [place] 20/30 -1 +2 20/80   Dist ph [place]  20/25         Tonometry (Tonopen, 10:15 AM)       Right Left   Pressure 12 11         Pupils       Pupils APD   Right PERRL None   Left PERRL None         Visual Fields       Left Right    Full Full         Extraocular Movement       Right Left    Full Full         Neuro/Psych     Oriented x3: Yes           Slit Lamp and Fundus Exam     External Exam       Right Left   External Normal Normal         Slit Lamp Exam       Right Left   Lids/Lashes Normal Normal   Conjunctiva/Sclera White and quiet White and quiet   Cornea Clear Clear   Anterior Chamber Deep and quiet Deep and quiet   Iris Round and reactive Round  and reactive   Lens Centered posterior chamber intraocular lens Centered posterior chamber intraocular lens   Anterior Vitreous Normal Normal         Fundus Exam       Right Left   Posterior Vitreous Posterior vitreous detachment Posterior vitreous detachment   Disc Normal Normal   C/D Ratio 0.65 0.65   Macula Intermediate age related macular degeneration, no exudates, Pigmented atrophy in the faz, Macular thickening, Subretinal hemorrhage temporal and superotemporal to FAZ, large rim of hemorrhage, Soft drusen Intermediate age related macular degeneration, no macular thickening, no hemorrhage, no exudates   Vessels Normal Normal   Periphery Normal Normal            IMAGING AND PROCEDURES  Imaging and Procedures for 08/18/21  OCT, Retina - OU - Both Eyes       Right Eye Quality was good. Scan locations included subfoveal. Central Foveal Thickness: 297. Progression has been stable. Findings include no IRF, no SRF, retinal drusen , subretinal hyper-reflective material, pigment epithelial detachment, subretinal fluid.   Left Eye Quality was good. Scan locations included subfoveal. Central  Foveal Thickness: 317. Progression has been stable. Findings include no IRF, no SRF, pigment epithelial detachment.   Notes New onset thickening superotemporal to FAZ OD with subretinal hyper reflective material signifying hemorrhage.  OD.  OS no sign of CNVM     Color Fundus Photography Optos - OU - Both Eyes       Right Eye Progression has no prior data. Disc findings include normal observations, increased cup to disc ratio. Macula : drusen, retinal pigment epithelium abnormalities, hemorrhage. Vessels : normal observations. Periphery : normal observations.   Left Eye Progression has no prior data. Disc findings include normal observations, increased cup to disc ratio. Macula : drusen, retinal pigment epithelium abnormalities. Vessels : normal observations. Periphery : normal observations.   Notes OD with hemorrhage temporal to FAZ, from wet AMD, new onset.  Will need to commence therapy probably     Intravitreal Injection, Pharmacologic Agent - OD - Right Eye       Time Out 08/18/2021. 10:51 AM. Confirmed correct patient, procedure, site, and patient consented.   Anesthesia Topical anesthesia was used. Anesthetic medications included Lidocaine 4%.   Procedure Preparation included 5% betadine to ocular surface, 10% betadine to eyelids. A 30 gauge needle was used.   Injection: 2.5 mg bevacizumab 2.5 MG/0.1ML   Route: Intravitreal, Site: Right Eye   NDC: [id number], Lot: [id number], Expiration date: 10/04/2021   Post-op Post injection exam found visual acuity of at least counting fingers. The patient tolerated the procedure well. There were no complications. The patient received written and verbal post procedure care education. Post injection medications included ocuflox.              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  Threatening vision.  Risk benefits of treatment antivegF reviewed with the patient.  Patient understands the critical need to commence therapy right away.  We will commence with therapy intravitreal Avastin OD now  Intermediate stage nonexudative age-related macular degeneration of both eyes No sign of CNVM OS     ICD-10-CM   1. Exudative age-related macular degeneration of right eye with active choroidal neovascularization (HCC)  H35.3211 Intravitreal Injection, Pharmacologic Agent - OD - Right Eye    bevacizumab (AVASTIN) SOSY 2.5 mg    2. Intermediate  stage nonexudative age-related macular degeneration of both eyes  H35.3132 OCT, Retina - OU - Both Eyes    Color Fundus Photography Optos - OU - Both Eyes      1.  OD with large change in macular condition with new onset intraretinal subretinal hemorrhage from wet AMD.  Threatening acuity.  We will commence therapy today with intravitreal Avastin.  Risk benefits reviewed.  2.  No sign of CNVM OS  3.  medication, antivegF, delivered urgently today to prevent spread of intraretinal hemorrhage subretinal hemorrhage right eye  Ophthalmic Meds Ordered this visit:  Meds ordered this encounter  Medications   bevacizumab (AVASTIN) SOSY 2.5 mg       Return in about 5 weeks (around 09/22/2021) for dilate, OD, AVASTIN OCT.  There are no Patient Instructions on file for this visit.   Explained the diagnoses, plan, and follow up with the patient and they expressed understanding.  Patient expressed understanding of the importance of proper follow up care.   Alford Highland Keygan Dumond M.D. Diseases & Surgery of the Retina and Vitreous [facility] 08/18/21     Abbreviations: M myopia (nearsighted); A astigmatism; H hyperopia (farsighted); P presbyopia; Mrx spectacle prescription;  CTL contact lenses; OD right eye; OS left eye; OU both eyes  XT exotropia; ET esotropia; PEK punctate epithelial keratitis; PEE  punctate epithelial erosions; DES dry eye syndrome; MGD meibomian gland dysfunction; ATs artificial tears; PFAT's preservative free artificial tears; NSC nuclear sclerotic cataract; PSC posterior subcapsular cataract; ERM epi-retinal membrane; PVD posterior vitreous detachment; RD retinal detachment; DM diabetes mellitus; DR diabetic retinopathy; NPDR non-proliferative diabetic retinopathy; PDR proliferative diabetic retinopathy; CSME clinically significant macular edema; DME diabetic macular edema; dbh dot blot hemorrhages; CWS cotton wool spot; POAG primary open angle glaucoma; C/D cup-to-disc ratio; HVF humphrey visual field; GVF goldmann visual field; OCT optical coherence tomography; IOP intraocular pressure; BRVO Branch retinal vein occlusion; CRVO central retinal vein occlusion; CRAO central retinal artery occlusion; BRAO branch retinal artery occlusion; RT retinal tear; SB scleral buckle; PPV pars plana vitrectomy; VH Vitreous hemorrhage; PRP panretinal laser photocoagulation; IVK intravitreal kenalog; VMT vitreomacular traction; MH Macular hole;  NVD neovascularization of the disc; NVE neovascularization elsewhere; AREDS age related eye disease study; ARMD age related macular degeneration; POAG primary open angle glaucoma; EBMD epithelial/anterior basement membrane dystrophy; ACIOL anterior chamber intraocular lens; IOL intraocular lens; PCIOL posterior chamber intraocular lens; Phaco/IOL phacoemulsification with intraocular lens placement; PRK photorefractive keratectomy; LASIK laser assisted in situ keratomileusis; HTN hypertension; DM diabetes mellitus; COPD chronic obstructive pulmonary disease

## 2021-08-18 NOTE — Assessment & Plan Note (Signed)
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  Threatening vision.  Risk benefits of treatment antivegF reviewed with the patient.  Patient understands the critical need to commence therapy right away.  We will commence with therapy intravitreal Avastin OD now

## 2021-09-22 ENCOUNTER — Encounter (INDEPENDENT_AMBULATORY_CARE_PROVIDER_SITE_OTHER): Payer: Self-pay | Admitting: Ophthalmology

## 2021-09-22 ENCOUNTER — Ambulatory Visit (INDEPENDENT_AMBULATORY_CARE_PROVIDER_SITE_OTHER): Payer: Medicare Other | Admitting: Ophthalmology

## 2021-09-22 DIAGNOSIS — H353211 Exudative age-related macular degeneration, right eye, with active choroidal neovascularization: Secondary | ICD-10-CM

## 2021-09-22 DIAGNOSIS — H353132 Nonexudative age-related macular degeneration, bilateral, intermediate dry stage: Secondary | ICD-10-CM | POA: Diagnosis not present

## 2021-09-22 MED ORDER — BEVACIZUMAB 2.5 MG/0.1ML IZ SOSY
2.5000 mg | PREFILLED_SYRINGE | INTRAVITREAL | Status: AC | PRN
  Administered 2021-09-22: 2.5 mg via INTRAVITREAL

## 2021-09-22 NOTE — Assessment & Plan Note (Signed)
No sign of CNVM OS 

## 2021-09-22 NOTE — Progress Notes (Signed)
09/22/2021     CHIEF COMPLAINT Patient presents for  Chief Complaint  Patient presents with   Macular Degeneration      HISTORY OF PRESENT ILLNESS: Katherine Atkins is a 81 y.o. female who presents to the clinic today for:   HPI   5 weeks dilate OD Avastin OCT Pt states her vision has not been stable  Pt admits to a new floater in both her eyes Pt states "its like a crystal it gets big and then goes away"   Last edited by Aleene Davidson, CMA on 09/22/2021 10:33 AM.      Referring physician: Geoffry Paradise, MD [address] [place],  [place] [address]  HISTORICAL INFORMATION:   Selected notes from the MEDICAL RECORD NUMBER       CURRENT MEDICATIONS: No current outpatient medications on file. (Ophthalmic Drugs)   No current facility-administered medications for this visit. (Ophthalmic Drugs)   No current outpatient medications on file. (Other)   No current facility-administered medications for this visit. (Other)      REVIEW OF SYSTEMS: ROS   Negative for: Constitutional, Gastrointestinal, Neurological, Skin, Genitourinary, Musculoskeletal, HENT, Endocrine, Cardiovascular, Eyes, Respiratory, Psychiatric, Allergic/Imm, Heme/Lymph Last edited by Edmon Crape, MD on 09/22/2021 11:03 AM.       ALLERGIES No Known Allergies  PAST MEDICAL HISTORY Past Medical History:  Diagnosis Date   Blood dyscrasia    Cataract    Fever 08/27/2013   Heart murmur    Osteoporosis    Past Surgical History:  Procedure Laterality Date   CATARACT EXTRACTION  2013   EYE SURGERY      FAMILY HISTORY Family History  Problem Relation Age of Onset   Kidney disease Father    Heart disease Maternal Grandfather    Heart disease Paternal Grandmother     SOCIAL HISTORY Social History   Tobacco Use   Smoking status: Never   Smokeless tobacco: Never  Substance Use Topics   Alcohol use: No   Drug use: No         OPHTHALMIC EXAM:  Base Eye Exam     Visual Acuity  (ETDRS)       Right Left   Dist [place] 20/20 20/30         Tonometry (Tonopen, 10:39 AM)       Right Left   Pressure 10 8         Pupils       Pupils APD   Right PERRL None   Left PERRL          Visual Fields       Left Right    Full Full         Extraocular Movement       Right Left    Full, Ortho Full, Ortho         Neuro/Psych     Oriented x3: Yes         Dilation     Right eye: 2.5% Phenylephrine, 1.0% Mydriacyl @ 10:34 AM           Slit Lamp and Fundus Exam     External Exam       Right Left   External Normal Normal         Slit Lamp Exam       Right Left   Lids/Lashes Normal Normal   Conjunctiva/Sclera White and quiet White and quiet   Cornea Clear Clear   Anterior Chamber Deep and quiet Deep  and quiet   Iris Round and reactive Round and reactive   Lens Centered posterior chamber intraocular lens Centered posterior chamber intraocular lens   Anterior Vitreous Normal Normal         Fundus Exam       Right Left   Posterior Vitreous Posterior vitreous detachment    Disc Normal    C/D Ratio 0.65    Macula Intermediate age related macular degeneration, no macular thickening, no hemorrhage, no exudates, Pigmented atrophy in the faz    Vessels Normal    Periphery Normal             IMAGING AND PROCEDURES  Imaging and Procedures for 09/22/21  OCT, Retina - OU - Both Eyes       Right Eye Quality was good. Scan locations included subfoveal. Central Foveal Thickness: 267. Progression has been stable. Findings include no IRF, no SRF, retinal drusen , subretinal hyper-reflective material, pigment epithelial detachment, subretinal fluid.   Left Eye Quality was good. Scan locations included subfoveal. Central Foveal Thickness: 311. Progression has been stable. Findings include no IRF, no SRF, pigment epithelial detachment.   Notes Recent onset thickening superotemporal to FAZ OD with subretinal hyper reflective material  signifying hemorrhage.  OD.  Much improved today post injection of 1 , Avastin.  Repeat injection today and reevaluate again in 1 month to 6 weeks  OS no sign of CNVM      Intravitreal Injection, Pharmacologic Agent - OD - Right Eye       Time Out 09/22/2021. 11:06 AM. Confirmed correct patient, procedure, site, and patient consented.   Anesthesia Topical anesthesia was used. Anesthetic medications included Lidocaine 4%.   Procedure Preparation included 5% betadine to ocular surface, 10% betadine to eyelids. A 30 gauge needle was used.   Injection: 2.5 mg bevacizumab 2.5 MG/0.1ML   Route: Intravitreal, Site: Right Eye   NDC: [phone], Lot: [id number], Expiration date: 11/15/2021, Waste: 0 mL   Post-op Post injection exam found visual acuity of at least counting fingers. The patient tolerated the procedure well. There were no complications. The patient received written and verbal post procedure care education. Post injection medications included ocuflox.              ASSESSMENT/PLAN:  Exudative age-related macular degeneration of right eye with active choroidal neovascularization (HCC) The nature of wet macular degeneration was discussed with the patient.  Forms of therapy reviewed include the use of Anti-VEGF medications injected painlessly into the eye, as well as other possible treatment modalities, including thermal laser therapy. Fellow eye involvement and risks were discussed with the patient. Upon the finding of wet age related macular degeneration, treatment will be offered. The treatment regimen is on a treat as needed basis with the intent to treat if necessary and extend interval of exams when possible. On average 1 out of 6 patients do not need lifetime therapy. However, the risk of recurrent disease is high for a lifetime.  Initially monthly, then periodic, examinations and evaluations will determine whether the next treatment is required on the day of the  examination.  OD, vastly improved post injection #1 of Avastin we will repeat injection today at 5 weeks and maintain 4 to 6-week interval follow-up next  Intermediate stage nonexudative age-related macular degeneration of both eyes No sign of CNVM OS     ICD-10-CM   1. Exudative age-related macular degeneration of right eye with active choroidal neovascularization (HCC)  H35.3211 OCT, Retina - OU - Both  Eyes    Intravitreal Injection, Pharmacologic Agent - OD - Right Eye    bevacizumab (AVASTIN) SOSY 2.5 mg    2. Intermediate stage nonexudative age-related macular degeneration of both eyes  H35.3132       1.  OD repeat injection of Avastin today to maintain improved macular anatomy and preserve acuity.  Follow-up again in 4 to 6 weeks  2.  Dilate OD next possible Avastin OD  3.  Ophthalmic Meds Ordered this visit:  Meds ordered this encounter  Medications   bevacizumab (AVASTIN) SOSY 2.5 mg       Return in about 1 month (around 10/23/2021) for dilate, OD, AVASTIN OCT.  There are no Patient Instructions on file for this visit.   Explained the diagnoses, plan, and follow up with the patient and they expressed understanding.  Patient expressed understanding of the importance of proper follow up care.   Alford Highland Ashad Fawbush M.D. Diseases & Surgery of the Retina and Vitreous [facility] 09/22/21     Abbreviations: M myopia (nearsighted); A astigmatism; H hyperopia (farsighted); P presbyopia; Mrx spectacle prescription;  CTL contact lenses; OD right eye; OS left eye; OU both eyes  XT exotropia; ET esotropia; PEK punctate epithelial keratitis; PEE punctate epithelial erosions; DES dry eye syndrome; MGD meibomian gland dysfunction; ATs artificial tears; PFAT's preservative free artificial tears; NSC nuclear sclerotic cataract; PSC posterior subcapsular cataract; ERM epi-retinal membrane; PVD posterior vitreous detachment; RD retinal detachment; DM diabetes mellitus;  DR diabetic retinopathy; NPDR non-proliferative diabetic retinopathy; PDR proliferative diabetic retinopathy; CSME clinically significant macular edema; DME diabetic macular edema; dbh dot blot hemorrhages; CWS cotton wool spot; POAG primary open angle glaucoma; C/D cup-to-disc ratio; HVF humphrey visual field; GVF goldmann visual field; OCT optical coherence tomography; IOP intraocular pressure; BRVO Branch retinal vein occlusion; CRVO central retinal vein occlusion; CRAO central retinal artery occlusion; BRAO branch retinal artery occlusion; RT retinal tear; SB scleral buckle; PPV pars plana vitrectomy; VH Vitreous hemorrhage; PRP panretinal laser photocoagulation; IVK intravitreal kenalog; VMT vitreomacular traction; MH Macular hole;  NVD neovascularization of the disc; NVE neovascularization elsewhere; AREDS age related eye disease study; ARMD age related macular degeneration; POAG primary open angle glaucoma; EBMD epithelial/anterior basement membrane dystrophy; ACIOL anterior chamber intraocular lens; IOL intraocular lens; PCIOL posterior chamber intraocular lens; Phaco/IOL phacoemulsification with intraocular lens placement; PRK photorefractive keratectomy; LASIK laser assisted in situ keratomileusis; HTN hypertension; DM diabetes mellitus; COPD chronic obstructive pulmonary disease

## 2021-09-22 NOTE — Assessment & Plan Note (Signed)
The nature of wet macular degeneration was discussed with the patient.  Forms of therapy reviewed include the use of Anti-VEGF medications injected painlessly into the eye, as well as other possible treatment modalities, including thermal laser therapy. Fellow eye involvement and risks were discussed with the patient. Upon the finding of wet age related macular degeneration, treatment will be offered. The treatment regimen is on a treat as needed basis with the intent to treat if necessary and extend interval of exams when possible. On average 1 out of 6 patients do not need lifetime therapy. However, the risk of recurrent disease is high for a lifetime.  Initially monthly, then periodic, examinations and evaluations will determine whether the next treatment is required on the day of the examination.  OD, vastly improved post injection #1 of Avastin we will repeat injection today at 5 weeks and maintain 4 to 6-week interval follow-up next

## 2021-10-05 ENCOUNTER — Emergency Department (HOSPITAL_COMMUNITY): Payer: Medicare Other

## 2021-10-05 ENCOUNTER — Inpatient Hospital Stay (HOSPITAL_COMMUNITY): Payer: Medicare Other

## 2021-10-05 ENCOUNTER — Encounter (HOSPITAL_COMMUNITY): Payer: Self-pay | Admitting: *Deleted

## 2021-10-05 ENCOUNTER — Inpatient Hospital Stay (HOSPITAL_COMMUNITY)
Admission: EM | Admit: 2021-10-05 | Discharge: 2021-10-11 | DRG: 958 | Disposition: A | Discharge status: Rehabilitation Facility | Payer: Medicare Other | Attending: Surgery | Admitting: Surgery

## 2021-10-05 ENCOUNTER — Other Ambulatory Visit: Payer: Self-pay

## 2021-10-05 DIAGNOSIS — S52612A Displaced fracture of left ulna styloid process, initial encounter for closed fracture: Secondary | ICD-10-CM | POA: Diagnosis present

## 2021-10-05 DIAGNOSIS — T07XXXA Unspecified multiple injuries, initial encounter: Secondary | ICD-10-CM | POA: Diagnosis not present

## 2021-10-05 DIAGNOSIS — E8721 Acute metabolic acidosis: Secondary | ICD-10-CM | POA: Diagnosis present

## 2021-10-05 DIAGNOSIS — M810 Age-related osteoporosis without current pathological fracture: Secondary | ICD-10-CM | POA: Diagnosis present

## 2021-10-05 DIAGNOSIS — D649 Anemia, unspecified: Secondary | ICD-10-CM | POA: Diagnosis not present

## 2021-10-05 DIAGNOSIS — S32119A Unspecified Zone I fracture of sacrum, initial encounter for closed fracture: Secondary | ICD-10-CM | POA: Diagnosis present

## 2021-10-05 DIAGNOSIS — S72432A Displaced fracture of medial condyle of left femur, initial encounter for closed fracture: Secondary | ICD-10-CM | POA: Diagnosis present

## 2021-10-05 DIAGNOSIS — S62102D Fracture of unspecified carpal bone, left wrist, subsequent encounter for fracture with routine healing: Secondary | ICD-10-CM | POA: Diagnosis not present

## 2021-10-05 DIAGNOSIS — I10 Essential (primary) hypertension: Secondary | ICD-10-CM | POA: Diagnosis present

## 2021-10-05 DIAGNOSIS — S329XXA Fracture of unspecified parts of lumbosacral spine and pelvis, initial encounter for closed fracture: Secondary | ICD-10-CM | POA: Diagnosis present

## 2021-10-05 DIAGNOSIS — S32592A Other specified fracture of left pubis, initial encounter for closed fracture: Secondary | ICD-10-CM | POA: Diagnosis present

## 2021-10-05 DIAGNOSIS — Z8249 Family history of ischemic heart disease and other diseases of the circulatory system: Secondary | ICD-10-CM

## 2021-10-05 DIAGNOSIS — R9389 Abnormal findings on diagnostic imaging of other specified body structures: Secondary | ICD-10-CM | POA: Diagnosis present

## 2021-10-05 DIAGNOSIS — R7401 Elevation of levels of liver transaminase levels: Secondary | ICD-10-CM | POA: Diagnosis not present

## 2021-10-05 DIAGNOSIS — S270XXA Traumatic pneumothorax, initial encounter: Secondary | ICD-10-CM | POA: Diagnosis present

## 2021-10-05 DIAGNOSIS — I714 Abdominal aortic aneurysm, without rupture, unspecified: Secondary | ICD-10-CM | POA: Diagnosis present

## 2021-10-05 DIAGNOSIS — S62102A Fracture of unspecified carpal bone, left wrist, initial encounter for closed fracture: Secondary | ICD-10-CM | POA: Diagnosis not present

## 2021-10-05 DIAGNOSIS — R5381 Other malaise: Secondary | ICD-10-CM | POA: Diagnosis present

## 2021-10-05 DIAGNOSIS — Y92410 Unspecified street and highway as the place of occurrence of the external cause: Secondary | ICD-10-CM

## 2021-10-05 DIAGNOSIS — S2241XA Multiple fractures of ribs, right side, initial encounter for closed fracture: Secondary | ICD-10-CM | POA: Diagnosis present

## 2021-10-05 DIAGNOSIS — S63005A Unspecified dislocation of left wrist and hand, initial encounter: Secondary | ICD-10-CM

## 2021-10-05 DIAGNOSIS — E876 Hypokalemia: Secondary | ICD-10-CM | POA: Diagnosis not present

## 2021-10-05 DIAGNOSIS — S52572A Other intraarticular fracture of lower end of left radius, initial encounter for closed fracture: Principal | ICD-10-CM | POA: Diagnosis present

## 2021-10-05 DIAGNOSIS — M25559 Pain in unspecified hip: Secondary | ICD-10-CM | POA: Diagnosis not present

## 2021-10-05 DIAGNOSIS — R7989 Other specified abnormal findings of blood chemistry: Secondary | ICD-10-CM | POA: Diagnosis present

## 2021-10-05 DIAGNOSIS — S3282XA Multiple fractures of pelvis without disruption of pelvic ring, initial encounter for closed fracture: Secondary | ICD-10-CM

## 2021-10-05 DIAGNOSIS — S62102S Fracture of unspecified carpal bone, left wrist, sequela: Secondary | ICD-10-CM | POA: Diagnosis not present

## 2021-10-05 LAB — I-STAT CHEM 8, ED
BUN: 10 mg/dL (ref 8–23)
Calcium, Ion: 0.83 mmol/L — CL (ref 1.15–1.40)
Chloride: 116 mmol/L — ABNORMAL HIGH (ref 98–111)
Creatinine, Ser: 0.2 mg/dL — ABNORMAL LOW (ref 0.44–1.00)
Glucose, Bld: 84 mg/dL (ref 70–99)
HCT: 29 % — ABNORMAL LOW (ref 36.0–46.0)
Hemoglobin: 9.9 g/dL — ABNORMAL LOW (ref 12.0–15.0)
Potassium: 2.4 mmol/L — CL (ref 3.5–5.1)
Sodium: 148 mmol/L — ABNORMAL HIGH (ref 135–145)
TCO2: 15 mmol/L — ABNORMAL LOW (ref 22–32)

## 2021-10-05 LAB — COMPREHENSIVE METABOLIC PANEL
ALT: 96 U/L — ABNORMAL HIGH (ref 0–44)
AST: 172 U/L — ABNORMAL HIGH (ref 15–41)
Albumin: 3.4 g/dL — ABNORMAL LOW (ref 3.5–5.0)
Alkaline Phosphatase: 50 U/L (ref 38–126)
Anion gap: 6 (ref 5–15)
BUN: 16 mg/dL (ref 8–23)
CO2: 23 mmol/L (ref 22–32)
Calcium: 8.6 mg/dL — ABNORMAL LOW (ref 8.9–10.3)
Chloride: 110 mmol/L (ref 98–111)
Creatinine, Ser: 0.76 mg/dL (ref 0.44–1.00)
GFR, Estimated: >60 mL/min (ref >60)
Glucose, Bld: 132 mg/dL — ABNORMAL HIGH (ref 70–99)
Potassium: 3.9 mmol/L (ref 3.5–5.1)
Sodium: 139 mmol/L (ref 135–145)
Total Bilirubin: 1.2 mg/dL (ref 0.3–1.2)
Total Protein: 6.5 g/dL (ref 6.5–8.1)

## 2021-10-05 LAB — CBC
HCT: 42.3 % (ref 36.0–46.0)
Hemoglobin: 14.1 g/dL (ref 12.0–15.0)
MCH: 31.2 pg (ref 26.0–34.0)
MCHC: 33.3 g/dL (ref 30.0–36.0)
MCV: 93.6 fL (ref 80.0–100.0)
Platelets: 184 10*3/uL (ref 150–400)
RBC: 4.52 MIL/uL (ref 3.87–5.11)
RDW: 12.7 % (ref 11.5–15.5)
WBC: 10 10*3/uL (ref 4.0–10.5)
nRBC: 0 % (ref 0.0–0.2)

## 2021-10-05 LAB — SAMPLE TO BLOOD BANK

## 2021-10-05 LAB — ETHANOL: Alcohol, Ethyl (B): <10 mg/dL (ref <10)

## 2021-10-05 LAB — PROTIME-INR
INR: 1.1 (ref 0.8–1.2)
Prothrombin Time: 14 seconds (ref 11.4–15.2)

## 2021-10-05 LAB — LACTIC ACID, PLASMA: Lactic Acid, Venous: 2.3 mmol/L (ref 0.5–1.9)

## 2021-10-05 MED ORDER — LACTATED RINGERS IV SOLN: INTRAVENOUS | Status: DC

## 2021-10-05 MED ORDER — MORPHINE SULFATE (PF) 2 MG/ML IV SOLN
2.0000 mg | INTRAVENOUS | Status: DC | PRN
  Administered 2021-10-05 (×2): 2 mg via INTRAVENOUS
  Filled 2021-10-05 (×2): qty 1

## 2021-10-05 MED ORDER — ONDANSETRON 4 MG PO TBDP: 4.0000 mg | ORAL_TABLET | Freq: Four times a day (QID) | ORAL | Status: DC | PRN

## 2021-10-05 MED ORDER — KETOROLAC TROMETHAMINE 15 MG/ML IJ SOLN
15.0000 mg | Freq: Four times a day (QID) | INTRAMUSCULAR | Status: AC
  Administered 2021-10-05 – 2021-10-10 (×18): 15 mg via INTRAVENOUS
  Filled 2021-10-05 (×19): qty 1

## 2021-10-05 MED ORDER — LIDOCAINE HCL 2 % IJ SOLN
INTRAMUSCULAR | Status: AC
  Filled 2021-10-05: qty 20

## 2021-10-05 MED ORDER — ONDANSETRON HCL 4 MG/2ML IJ SOLN
4.0000 mg | Freq: Once | INTRAMUSCULAR | Status: AC
  Administered 2021-10-05: 4 mg via INTRAVENOUS
  Filled 2021-10-05: qty 2

## 2021-10-05 MED ORDER — LIDOCAINE-EPINEPHRINE (PF) 2 %-1:200000 IJ SOLN
10.0000 mL | Freq: Once | INTRAMUSCULAR | Status: AC
  Administered 2021-10-05: 10 mL

## 2021-10-05 MED ORDER — FENTANYL CITRATE PF 50 MCG/ML IJ SOSY
50.0000 ug | PREFILLED_SYRINGE | Freq: Once | INTRAMUSCULAR | Status: AC
  Administered 2021-10-05: 50 ug via INTRAVENOUS
  Filled 2021-10-05: qty 1

## 2021-10-05 MED ORDER — ONDANSETRON HCL 4 MG/2ML IJ SOLN: 4.0000 mg | Freq: Four times a day (QID) | INTRAMUSCULAR | Status: DC | PRN

## 2021-10-05 MED ORDER — ACETAMINOPHEN 500 MG PO TABS
1000.0000 mg | ORAL_TABLET | Freq: Four times a day (QID) | ORAL | Status: DC
Start: 2021-10-05 — End: 2021-10-11
  Administered 2021-10-05 – 2021-10-11 (×21): 1000 mg via ORAL
  Filled 2021-10-05 (×22): qty 2

## 2021-10-05 MED ORDER — IOHEXOL 300 MG/ML  SOLN
100.0000 mL | Freq: Once | INTRAMUSCULAR | Status: AC | PRN
  Administered 2021-10-05: 100 mL via INTRAVENOUS

## 2021-10-05 MED ORDER — OXYCODONE HCL 5 MG PO TABS
2.5000 mg | ORAL_TABLET | ORAL | Status: DC | PRN
  Administered 2021-10-05: 5 mg via ORAL
  Administered 2021-10-06: 2.5 mg via ORAL
  Administered 2021-10-06 – 2021-10-07 (×6): 5 mg via ORAL
  Filled 2021-10-05 (×9): qty 1

## 2021-10-05 MED ORDER — ENOXAPARIN SODIUM 30 MG/0.3ML IJ SOSY
30.0000 mg | PREFILLED_SYRINGE | Freq: Two times a day (BID) | INTRAMUSCULAR | Status: DC
  Administered 2021-10-07 – 2021-10-11 (×9): 30 mg via SUBCUTANEOUS
  Filled 2021-10-05 (×10): qty 0.3

## 2021-10-05 MED ORDER — SODIUM CHLORIDE 0.9 % IV BOLUS
125.0000 mL | Freq: Once | INTRAVENOUS | Status: AC
  Administered 2021-10-05: 125 mL via INTRAVENOUS

## 2021-10-05 MED ORDER — DOCUSATE SODIUM 100 MG PO CAPS
100.0000 mg | ORAL_CAPSULE | Freq: Two times a day (BID) | ORAL | Status: DC
  Administered 2021-10-05 – 2021-10-11 (×11): 100 mg via ORAL
  Filled 2021-10-05 (×13): qty 1

## 2021-10-05 MED ORDER — METHOCARBAMOL 500 MG PO TABS
1000.0000 mg | ORAL_TABLET | Freq: Three times a day (TID) | ORAL | Status: DC
  Administered 2021-10-05 – 2021-10-11 (×17): 1000 mg via ORAL
  Filled 2021-10-05 (×18): qty 2

## 2021-10-05 NOTE — Progress Notes (Signed)
OT Cancellation Note  Patient Details Name: Almee Pelphrey MRN: [id number] DOB: 1940/08/23   Cancelled Treatment:    Reason Eval/Treat Not Completed: Other (comment) (Awating further imaging for pelvis. Will return as schedule allows.)  Joden Bonsall M Aydan Levitz Naome Brigandi MSOT, OTR/L Acute Rehab Office: [phone] 10/05/2021, 11:02 AM
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
## 2021-10-05 NOTE — H&P (Signed)
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 [facility] 10/05/2021, 11:49 AM Please see Amion for pager number during day hours 7:00am-4:30pm

## 2021-10-05 NOTE — ED Notes (Signed)
Dr Lovick at bedside ?

## 2021-10-05 NOTE — ED Provider Notes (Signed)
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 Discharge Orders     None         Skeet Simmer, MD 10/05/21 1609    Gwyneth Sprout, MD 10/06/21 [phone]
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
## 2021-10-05 NOTE — Progress Notes (Addendum)
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  CT 09/24/2013 FINDINGS: Minimally comminuted fractures of left superior inferior pubic rami, displaced up to 1 cm. No hip dislocation. No other pelvic ring fractures conspicuous. Coarse mid pelvic calcifications presumably degenerative fibroids. IMPRESSION: Displaced left superior and inferior pubic rami fractures. Electronically Signed   By: Corlis Leak M.D.   On: 10/05/2021 08:56    ROS: 14 point review of systems negative except per HPI
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
## 2021-10-05 NOTE — ED Notes (Signed)
Called and spoke with patient roommate Andre Lefort [phone] aware of patient status and she spoke with patient

## 2021-10-05 NOTE — Progress Notes (Signed)
PT Cancellation Note  Patient Details Name: Maral Lampe MRN: [id number] DOB: Jun 29, 1940   Cancelled Treatment:    Reason Eval/Treat Not Completed: Medical issues which prohibited therapy Awaiting further imaging and ortho recs. Will follow up as pt medically appropriate and as schedule allows.   Cindee Salt, DPT  Acute Rehabilitation Services  Office: [phone]    Lehman Prom 10/05/2021, 11:02 AM
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
## 2021-10-06 ENCOUNTER — Encounter (HOSPITAL_COMMUNITY): Admission: EM | Disposition: A | Discharge status: Rehabilitation Facility | Payer: Self-pay | Source: Home / Self Care

## 2021-10-06 ENCOUNTER — Inpatient Hospital Stay (HOSPITAL_COMMUNITY): Payer: Medicare Other

## 2021-10-06 ENCOUNTER — Inpatient Hospital Stay (HOSPITAL_COMMUNITY): Payer: Medicare Other | Admitting: Certified Registered Nurse Anesthetist

## 2021-10-06 ENCOUNTER — Encounter (HOSPITAL_COMMUNITY): Payer: Self-pay

## 2021-10-06 DIAGNOSIS — S62102A Fracture of unspecified carpal bone, left wrist, initial encounter for closed fracture: Secondary | ICD-10-CM | POA: Diagnosis not present

## 2021-10-06 DIAGNOSIS — D649 Anemia, unspecified: Secondary | ICD-10-CM | POA: Diagnosis not present

## 2021-10-06 HISTORY — PX: ORIF WRIST FRACTURE: SHX2133

## 2021-10-06 LAB — BASIC METABOLIC PANEL
Anion gap: 4 — ABNORMAL LOW (ref 5–15)
BUN: 22 mg/dL (ref 8–23)
CO2: 27 mmol/L (ref 22–32)
Calcium: 8 mg/dL — ABNORMAL LOW (ref 8.9–10.3)
Chloride: 104 mmol/L (ref 98–111)
Creatinine, Ser: 0.92 mg/dL (ref 0.44–1.00)
GFR, Estimated: >60 mL/min (ref >60)
Glucose, Bld: 125 mg/dL — ABNORMAL HIGH (ref 70–99)
Potassium: 3.9 mmol/L (ref 3.5–5.1)
Sodium: 135 mmol/L (ref 135–145)

## 2021-10-06 LAB — URINALYSIS, ROUTINE W REFLEX MICROSCOPIC
Bacteria, UA: NONE SEEN
Bilirubin Urine: NEGATIVE
Glucose, UA: NEGATIVE mg/dL
Hgb urine dipstick: NEGATIVE
Ketones, ur: NEGATIVE mg/dL
Nitrite: NEGATIVE
Protein, ur: NEGATIVE mg/dL
Specific Gravity, Urine: 1.025 (ref 1.005–1.030)
pH: 5 (ref 5.0–8.0)

## 2021-10-06 LAB — CBC
HCT: 33.4 % — ABNORMAL LOW (ref 36.0–46.0)
Hemoglobin: 11.1 g/dL — ABNORMAL LOW (ref 12.0–15.0)
MCH: 30.9 pg (ref 26.0–34.0)
MCHC: 33.2 g/dL (ref 30.0–36.0)
MCV: 93 fL (ref 80.0–100.0)
Platelets: 145 10*3/uL — ABNORMAL LOW (ref 150–400)
RBC: 3.59 MIL/uL — ABNORMAL LOW (ref 3.87–5.11)
RDW: 13 % (ref 11.5–15.5)
WBC: 8.3 10*3/uL (ref 4.0–10.5)
nRBC: 0 % (ref 0.0–0.2)

## 2021-10-06 LAB — SURGICAL PCR SCREEN
MRSA, PCR: NEGATIVE
Staphylococcus aureus: NEGATIVE

## 2021-10-06 SURGERY — OPEN REDUCTION INTERNAL FIXATION (ORIF) WRIST FRACTURE
Anesthesia: Monitor Anesthesia Care | Site: Wrist | Laterality: Left

## 2021-10-06 MED ORDER — PROPOFOL 500 MG/50ML IV EMUL
INTRAVENOUS | Status: DC | PRN
  Administered 2021-10-06: 75 ug/kg/min via INTRAVENOUS

## 2021-10-06 MED ORDER — CHLORHEXIDINE GLUCONATE 4 % EX LIQD
60.0000 mL | Freq: Once | CUTANEOUS | Status: AC
Start: 2021-10-06 — End: 2021-10-06
  Administered 2021-10-06: 4 via TOPICAL
  Filled 2021-10-06: qty 60

## 2021-10-06 MED ORDER — PROPOFOL 10 MG/ML IV BOLUS
INTRAVENOUS | Status: DC | PRN
  Administered 2021-10-06: 30 mg via INTRAVENOUS

## 2021-10-06 MED ORDER — FENTANYL CITRATE (PF) 100 MCG/2ML IJ SOLN: 50.0000 ug | Freq: Once | INTRAMUSCULAR | Status: AC

## 2021-10-06 MED ORDER — ACETAMINOPHEN 500 MG PO TABS: 1000.0000 mg | ORAL_TABLET | Freq: Once | ORAL | Status: DC | PRN

## 2021-10-06 MED ORDER — LIDOCAINE 5 % EX PTCH
1.0000 | MEDICATED_PATCH | CUTANEOUS | Status: DC
  Administered 2021-10-07 – 2021-10-10 (×4): 1 via TRANSDERMAL
  Filled 2021-10-06 (×4): qty 1

## 2021-10-06 MED ORDER — MEPIVACAINE HCL (PF) 2 % IJ SOLN
INTRAMUSCULAR | Status: DC | PRN
  Administered 2021-10-06: 20 mL

## 2021-10-06 MED ORDER — OXYCODONE HCL 5 MG PO TABS: 5.0000 mg | ORAL_TABLET | Freq: Once | ORAL | Status: DC | PRN

## 2021-10-06 MED ORDER — HYDRALAZINE HCL 20 MG/ML IJ SOLN
INTRAMUSCULAR | Status: AC
  Filled 2021-10-06: qty 1

## 2021-10-06 MED ORDER — ACETAMINOPHEN 10 MG/ML IV SOLN: 1000.0000 mg | Freq: Once | INTRAVENOUS | Status: DC | PRN

## 2021-10-06 MED ORDER — FENTANYL CITRATE (PF) 100 MCG/2ML IJ SOLN
INTRAMUSCULAR | Status: AC
  Administered 2021-10-06: 50 ug via INTRAVENOUS
  Filled 2021-10-06: qty 2

## 2021-10-06 MED ORDER — 0.9 % SODIUM CHLORIDE (POUR BTL) OPTIME
TOPICAL | Status: DC | PRN
  Administered 2021-10-06: 1000 mL

## 2021-10-06 MED ORDER — LIDOCAINE 2% (20 MG/ML) 5 ML SYRINGE
INTRAMUSCULAR | Status: DC | PRN
  Administered 2021-10-06: 40 mg via INTRAVENOUS

## 2021-10-06 MED ORDER — HYDRALAZINE HCL 20 MG/ML IJ SOLN
5.0000 mg | Freq: Once | INTRAMUSCULAR | Status: AC
Start: 2021-10-06 — End: 2021-10-06
  Administered 2021-10-06: 5 mg via INTRAVENOUS

## 2021-10-06 MED ORDER — POVIDONE-IODINE 10 % EX SWAB
2.0000 | Freq: Once | CUTANEOUS | Status: AC
  Administered 2021-10-06: 2 via TOPICAL

## 2021-10-06 MED ORDER — ROPIVACAINE HCL 5 MG/ML IJ SOLN
INTRAMUSCULAR | Status: DC | PRN
  Administered 2021-10-06: 10 mL via PERINEURAL

## 2021-10-06 MED ORDER — CHLORHEXIDINE GLUCONATE 0.12 % MT SOLN
OROMUCOSAL | Status: AC
  Administered 2021-10-06: 15 mL via OROMUCOSAL
  Filled 2021-10-06: qty 15

## 2021-10-06 MED ORDER — ACETAMINOPHEN 160 MG/5ML PO SOLN: 1000.0000 mg | Freq: Once | ORAL | Status: DC | PRN

## 2021-10-06 MED ORDER — CHLORHEXIDINE GLUCONATE 0.12 % MT SOLN
15.0000 mL | OROMUCOSAL | Status: AC
  Filled 2021-10-06: qty 15

## 2021-10-06 MED ORDER — PROPOFOL 10 MG/ML IV BOLUS
INTRAVENOUS | Status: AC
  Filled 2021-10-06: qty 20

## 2021-10-06 MED ORDER — CEFAZOLIN SODIUM-DEXTROSE 2-4 GM/100ML-% IV SOLN
2.0000 g | INTRAVENOUS | Status: AC
  Administered 2021-10-06: 2 g via INTRAVENOUS
  Filled 2021-10-06: qty 100

## 2021-10-06 MED ORDER — CEFAZOLIN SODIUM-DEXTROSE 2-4 GM/100ML-% IV SOLN: 2.0000 g | INTRAVENOUS | Status: DC

## 2021-10-06 MED ORDER — BACITRACIN ZINC 500 UNIT/GM EX OINT
TOPICAL_OINTMENT | CUTANEOUS | Status: DC | PRN
  Administered 2021-10-06: 1 via TOPICAL

## 2021-10-06 MED ORDER — MIDAZOLAM HCL 2 MG/2ML IJ SOLN
INTRAMUSCULAR | Status: AC
  Filled 2021-10-06: qty 2

## 2021-10-06 MED ORDER — LIDOCAINE 2% (20 MG/ML) 5 ML SYRINGE
INTRAMUSCULAR | Status: AC
  Filled 2021-10-06: qty 5

## 2021-10-06 MED ORDER — ONDANSETRON HCL 4 MG/2ML IJ SOLN
INTRAMUSCULAR | Status: DC | PRN
  Administered 2021-10-06: 4 mg via INTRAVENOUS

## 2021-10-06 MED ORDER — BACITRACIN ZINC 500 UNIT/GM EX OINT
TOPICAL_OINTMENT | CUTANEOUS | Status: AC
Start: 2021-10-06 — End: ?
  Filled 2021-10-06: qty 28.35

## 2021-10-06 MED ORDER — LACTATED RINGERS IV SOLN: INTRAVENOUS | Status: DC

## 2021-10-06 MED ORDER — OXYCODONE HCL 5 MG/5ML PO SOLN: 5.0000 mg | Freq: Once | ORAL | Status: DC | PRN

## 2021-10-06 MED ORDER — FENTANYL CITRATE (PF) 100 MCG/2ML IJ SOLN: 25.0000 ug | INTRAMUSCULAR | Status: DC | PRN

## 2021-10-06 SURGICAL SUPPLY — 70 items
APL PRP STRL LF DISP 70% ISPRP (MISCELLANEOUS) ×1
BAG COUNTER SPONGE SURGICOUNT (BAG) ×2 IMPLANT
BAG SPNG CNTER NS LX DISP (BAG)
BIT DRILL 2.2 SS TIBIAL (BIT) ×1 IMPLANT
BLADE CLIPPER SURG (BLADE) IMPLANT
BNDG CMPR 9X4 STRL LF SNTH (GAUZE/BANDAGES/DRESSINGS) ×1
BNDG ELASTIC 3X5.8 VLCR STR LF (GAUZE/BANDAGES/DRESSINGS) ×3 IMPLANT
BNDG ELASTIC 4X5.8 VLCR STR LF (GAUZE/BANDAGES/DRESSINGS) ×2 IMPLANT
BNDG ESMARK 4X9 LF (GAUZE/BANDAGES/DRESSINGS) ×3 IMPLANT
BNDG GAUZE ELAST 4 BULKY (GAUZE/BANDAGES/DRESSINGS) ×2 IMPLANT
CANISTER SUCT 3000ML PPV (MISCELLANEOUS) ×3 IMPLANT
CHLORAPREP W/TINT 26 (MISCELLANEOUS) ×1 IMPLANT
CORD BIPOLAR FORCEPS 12FT (ELECTRODE) ×3 IMPLANT
COVER SURGICAL LIGHT HANDLE (MISCELLANEOUS) ×3 IMPLANT
CUFF TOURN SGL QUICK 18X4 (TOURNIQUET CUFF) ×3 IMPLANT
CUFF TOURN SGL QUICK 24 (TOURNIQUET CUFF)
CUFF TRNQT CYL 24X4X16.5-23 (TOURNIQUET CUFF) IMPLANT
DRAPE OEC MINIVIEW 54X84 (DRAPES) ×3 IMPLANT
DRAPE SURG 17X23 STRL (DRAPES) ×3 IMPLANT
DRSG ADAPTIC 3X8 NADH LF (GAUZE/BANDAGES/DRESSINGS) ×2 IMPLANT
DRSG EMULSION OIL 3X3 NADH (GAUZE/BANDAGES/DRESSINGS) ×4 IMPLANT
DVR VOLAR RIM NRW PLATE LEFT (Plate) ×2 IMPLANT
GAUZE SPONGE 4X4 12PLY STRL (GAUZE/BANDAGES/DRESSINGS) ×2 IMPLANT
GAUZE SPONGE 4X4 12PLY STRL LF (GAUZE/BANDAGES/DRESSINGS) ×2 IMPLANT
GLOVE BIOGEL PI IND STRL 7.5 (GLOVE) IMPLANT
GLOVE BIOGEL PI INDICATOR 7.5 (GLOVE) ×4
GLOVE SS BIOGEL STRL SZ 8 (GLOVE) ×2 IMPLANT
GLOVE SUPERSENSE BIOGEL SZ 8 (GLOVE)
GLOVE SURG UNDER POLY LF SZ7.5 (GLOVE) ×8 IMPLANT
GOWN STRL REUS W/ TWL LRG LVL3 (GOWN DISPOSABLE) ×4 IMPLANT
GOWN STRL REUS W/TWL LRG LVL3 (GOWN DISPOSABLE) ×4
HIBICLENS CHG 4% 4OZ BTL (MISCELLANEOUS) ×3 IMPLANT
K-WIRE 1.6 (WIRE) ×2
K-WIRE FX5X1.6XNS BN SS (WIRE) ×1
KIT BASIN OR (CUSTOM PROCEDURE TRAY) ×3 IMPLANT
KIT TURNOVER KIT B (KITS) ×3 IMPLANT
KWIRE FX5X1.6XNS BN SS (WIRE) IMPLANT
MANIFOLD NEPTUNE II (INSTRUMENTS) ×2 IMPLANT
NDL HYPO 25GX1X1/2 BEV (NEEDLE) ×2 IMPLANT
NEEDLE HYPO 25GX1X1/2 BEV (NEEDLE) IMPLANT
NS IRRIG 1000ML POUR BTL (IV SOLUTION) ×3 IMPLANT
PACK ORTHO EXTREMITY (CUSTOM PROCEDURE TRAY) ×3 IMPLANT
PAD ARMBOARD 7.5X6 YLW CONV (MISCELLANEOUS) ×5 IMPLANT
PAD CAST 3X4 CTTN HI CHSV (CAST SUPPLIES) ×2 IMPLANT
PAD CAST 4YDX4 CTTN HI CHSV (CAST SUPPLIES) ×2 IMPLANT
PADDING CAST COTTON 3X4 STRL (CAST SUPPLIES)
PADDING CAST COTTON 4X4 STRL (CAST SUPPLIES) ×2
PEG LOCKING SMOOTH 2.2X16 (Screw) ×1 IMPLANT
PEG LOCKING SMOOTH 2.2X18 (Peg) ×5 IMPLANT
PLATE VOLAR RIM NRRW DVR LEFT (Plate) IMPLANT
SCREW 2.7X14MM (Screw) ×2 IMPLANT
SCREW BN 14X2.7XNONLOCK 3 LD (Screw) IMPLANT
SCREW LOCK 14X2.7X 3 LD TPR (Screw) IMPLANT
SCREW LOCK 16X2.7X 3 LD TPR (Screw) IMPLANT
SCREW LOCKING 2.7X14 (Screw) ×4 IMPLANT
SCREW LOCKING 2.7X15MM (Screw) ×1 IMPLANT
SCREW LOCKING 2.7X16 (Screw) ×4 IMPLANT
SCREW NLOCK 2.7X14 (Screw) ×1 IMPLANT
SLING ARM FOAM STRAP MED (SOFTGOODS) ×1 IMPLANT
SPLINT PLASTER EXTRA FAST 3X15 (CAST SUPPLIES) ×2
SPLINT PLASTER GYPS XFAST 3X15 (CAST SUPPLIES) IMPLANT
SUCTION FRAZIER HANDLE 10FR (MISCELLANEOUS) ×2
SUCTION TUBE FRAZIER 10FR DISP (MISCELLANEOUS) IMPLANT
SUT ETHILON 4 0 PS 2 18 (SUTURE) ×4 IMPLANT
SYR CONTROL 10ML LL (SYRINGE) IMPLANT
TOWEL GREEN STERILE (TOWEL DISPOSABLE) ×3 IMPLANT
TOWEL GREEN STERILE FF (TOWEL DISPOSABLE) ×3 IMPLANT
TUBE CONNECTING 12X1/4 (SUCTIONS) ×3 IMPLANT
UNDERPAD 30X36 HEAVY ABSORB (UNDERPADS AND DIAPERS) ×3 IMPLANT
WATER STERILE IRR 1000ML POUR (IV SOLUTION) ×2 IMPLANT

## 2021-10-06 NOTE — Plan of Care (Signed)

## 2021-10-06 NOTE — Anesthesia Procedure Notes (Signed)
Anesthesia Regional Block: Supraclavicular block   Pre-Anesthetic Checklist: , timeout performed,  Correct Patient, Correct Site, Correct Laterality,  Correct Procedure, Correct Position, site marked,  Risks and benefits discussed,  Surgical consent,  Pre-op evaluation,  At surgeon's request and post-op pain management  Laterality: Left  Prep: Dura Prep       Needles:  Injection technique: Single-shot  Needle Type: Echogenic Stimulator Needle     Needle Length: 5cm  Needle Gauge: 20     Additional Needles:   Procedures:,,,, ultrasound used (permanent image in chart),,    Narrative:  Start time: 10/06/2021 6:45 PM End time: 10/06/2021 6:50 PM Injection made incrementally with aspirations every 5 mL.  Performed by: Personally  Anesthesiologist: Atilano Median, DO  Additional Notes: Patient identified. Risks/Benefits/Options discussed with patient including but not limited to bleeding, infection, nerve damage, failed block, incomplete pain control. Patient expressed understanding and wished to proceed. All questions were answered. Sterile technique was used throughout the entire procedure. Please see nursing notes for vital signs. Aspirated in 5cc intervals with injection for negative confirmation. Patient was given instructions on fall risk and not to get out of bed. All questions and concerns addressed with instructions to call with any issues or inadequate analgesia.
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
## 2021-10-06 NOTE — Transfer of Care (Signed)
Immediate Anesthesia Transfer of Care Note  Patient: Katherine Atkins  Procedure(s) Performed: OPEN REDUCTION INTERNAL FIXATION (ORIF) WRIST FRACTURE (Left: Wrist)  Patient Location: PACU  Anesthesia Type:Atkins combined with regional for post-op pain  Level of Consciousness: drowsy  Airway & Oxygen Therapy: Patient Spontanous Breathing and Patient connected to face mask oxygen  Post-op Assessment: Report given to RN and Post -op Vital signs reviewed and stable  Post vital signs: Reviewed and stable  Last Vitals:  Vitals Value Taken Time  BP 119/59 10/06/21 2113  Temp    Pulse 53 10/06/21 2114  Resp 12 10/06/21 2114  SpO2 99 % 10/06/21 2114  Vitals shown include unvalidated device data.  Last Pain:  Vitals:   10/06/21 1915  TempSrc:   PainSc: 0-No pain      Patients Stated Pain Goal: 0 (79/72/82 0601)  Complications: No notable events documented.

## 2021-10-06 NOTE — Anesthesia Procedure Notes (Signed)
Procedure Name: MAC Date/Time: 10/06/2021 7:44 PM  Performed by: Reece Agar, CRNAPre-anesthesia Checklist: Patient identified, Emergency Drugs available, Suction available, Patient being monitored and Timeout performed Patient Re-evaluated:Patient Re-evaluated prior to induction Oxygen Delivery Method: Simple face mask

## 2021-10-06 NOTE — Op Note (Addendum)
OPERATIVE NOTE  DATE OF PROCEDURE: 10/05/2021 - 10/06/2021  SURGEONS: Primary: [name], MD  PREOPERATIVE DIAGNOSIS: left wrist fracture  POSTOPERATIVE DIAGNOSIS: Same  NAME OF PROCEDURE:    Left distal radius open reduction internal fixation, intra-articular, >3 fragments 2.    Left wrist brachioradialis tenotomy  3.    Left wrist radiographs four views with intraoperative interpretation   ANESTHESIA: Regional Block + MAC  SKIN PREPARATION: Hibiclens  ESTIMATED BLOOD LOSS: Minimal  IMPLANTS: Biomet DVR Crosslock volar rim plate and screws  INDICATIONS:  Katherine Atkins is a 81 y.o. female who has the above preoperative diagnosis. The patient has decided to proceed with surgical intervention.  Risks, benefits and alternatives of operative management were discussed including, but not limited to, risks of anesthesia complications, infection, pain, persistent symptoms, stiffness, need for future surgery.  The patient understands, agrees and elects to proceed with surgery.    DESCRIPTION OF PROCEDURE: The patient was met in the pre-operative area and their identity was verified.  The operative location and laterality was also verified and marked.  The patient was brought to the OR and was placed supine on the table.  After repeat patient identification with the operative team anesthesia was provided and the patient was prepped and draped in the usual sterile fashion.  A final timeout was performed verifying the correction patient, procedure, location and laterality.  Preoperative antibiotics were administered. The left upper extremity was exsanguinated with an Esmarch and tourniquet inflated to 223mHg. Under loupe magnification, an incision was made directly over the flexor carpi radialis (FCR) tendon. Bipolar was utilized for hemostasis. The roof of the FCR tendon sheath was incised. The FCR tendon was then retracted ulnarly to protect the palmar cutaneous branch of the median nerve. Subsequently,  the floor of the FCR tendon sheath was incised over the distal end of the radius.  The flexor pollicis longus (FPL) was swept ulnarly to reveal the pronator quadratus.  The pronator quadratus fascia was incised from its distal and radial borders. A periosteal elevator was utilized to mobilize the pronator quadratus muscle off the distal radius.  The fracture site was irrigated and prepared for reduction with a freer and adson forceps. There were >3 distal radius fracture fragments. The brachioradialis tendon insertion was released to facilitate reduction.  This release was performed by identifying the broad insertion of the brachiradialis tendon and also identifying the 1st dorsal compartment tendons.  The 1st dorsal compartment tendons were protected, the broad tendon insertion was released under direct visualization. The fracture was reduced and provisionally fixed with K-wires. We then selected a proper length and width volar plate.  The plate was placed on the distal end of the radius with the fracture reduced and secured to the bone. Using mini C-arm the fracture reduction and position of the plate were deemed to be satisfactory.  We proceeded with securing the plate to the radius with the 1 bicortical nonlocking screw in the oblong portion of the shaft.  With the intermediate column reduced, we secured the distal end of the plate with 2 screws in the distal ulnar portion of plate.  We again used the C-arm to verify satisfactory plate position as well as fracture reduction. The radial column was then reduced and radial styloid locking screws were placed. The remainder shaft screws were placed through the plate. We used the mini C-arm to verify satisfactory plate position, screw lengths and fracture reduction. The DRUJ was then tested in neutral, pronation and supination and was found  to be stable. The wrist was brought through full ROM and carpus was quite stable without subluxation and excellent fixation with  volar rim plate holding the lunate facet fragment. The tourniquet was deflated. Meticulous hemostasis was obtained. The incision was copiously irrigated with normal saline and closed with interrupted 4-0 nylon horizontal mattress sutures. The incision was covered with xeroform, sterile guaze, webril and well padded short arm splint. The fingers were pink, warm and well perfused. All counts were correct. The patient was awoken from anesthesia and brought to PACU for recovery in stable condition.   Postop Plan: Ok to platform weight bear for early mobilization. No weight through wrist. Recommend OT for finger ROM. Strict elevation in carter pillow. Sling only needed for nerve block- once wears off ok to discontinue. Follow up in 10-14 days in my office.   Matt Holmes, MD Orthopaedic Hand Surgery

## 2021-10-06 NOTE — Progress Notes (Signed)
Plan for left wrist ORIF due to radiocarpal subluxation, displacement and need for upper extremity WB in order to facility mobility due to pelvic and knee injuries.   The risks and benefits of surgery were carefully explained including, but not limited to risks of infection, injury to nerves, blood vessels, neighboring structures, recurrence or continued symptoms, loss of motion or strength and the need for rehabilitation or further surgery. Additional risks that are particular to this surgery include nonunion, malunion, re-fracture, hardware complications, need for hardware removal. After thorough discussion and all questions were answered informed consent was obtained.  Mathis Dad, MD Orthopaedic Hand Surgeon
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
## 2021-10-06 NOTE — Progress Notes (Signed)
Patient went to OR with OR escort. Family notified.. Report given  to short stay.

## 2021-10-07 ENCOUNTER — Encounter (HOSPITAL_COMMUNITY): Payer: Self-pay | Admitting: Orthopedic Surgery

## 2021-10-07 MED ORDER — POLYETHYLENE GLYCOL 3350 17 G PO PACK
17.0000 g | PACK | Freq: Every day | ORAL | Status: DC
  Administered 2021-10-07: 17 g via ORAL
  Filled 2021-10-07: qty 1

## 2021-10-07 MED ORDER — HYDRALAZINE HCL 20 MG/ML IJ SOLN
5.0000 mg | Freq: Four times a day (QID) | INTRAMUSCULAR | Status: DC | PRN
Start: 2021-10-07 — End: 2021-10-11
  Administered 2021-10-08: 5 mg via INTRAVENOUS
  Filled 2021-10-07: qty 1
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
## 2021-10-07 NOTE — Progress Notes (Signed)
  Inpatient Rehabilitation Admissions Coordinator   Met with patient at bedside for rehab assessment. We discussed goals and expectations of a possible [name] admit. We also discussed current masking and visitor guidelines at [name] due to our [place] concerns.She is in agreement to pursue [name] for rehab. Her friend can provide supervision at home. I will begin insurance Auth with [facility] medicare/[employer] for possible [name] admit pending approval. Please call me with any questions.   Danne Baxter, RN, MSN Rehab Admissions Coordinator [phone]

## 2021-10-08 ENCOUNTER — Encounter (HOSPITAL_COMMUNITY): Payer: Self-pay | Admitting: Orthopedic Surgery

## 2021-10-08 LAB — BASIC METABOLIC PANEL
Anion gap: 8 (ref 5–15)
BUN: 12 mg/dL (ref 8–23)
CO2: 25 mmol/L (ref 22–32)
Calcium: 8 mg/dL — ABNORMAL LOW (ref 8.9–10.3)
Chloride: 107 mmol/L (ref 98–111)
Creatinine, Ser: 0.85 mg/dL (ref 0.44–1.00)
GFR, Estimated: >60 mL/min (ref >60)
Glucose, Bld: 121 mg/dL — ABNORMAL HIGH (ref 70–99)
Potassium: 3.4 mmol/L — ABNORMAL LOW (ref 3.5–5.1)
Sodium: 140 mmol/L (ref 135–145)

## 2021-10-08 LAB — MAGNESIUM: Magnesium: 2 mg/dL (ref 1.7–2.4)

## 2021-10-08 MED ORDER — POTASSIUM CHLORIDE 20 MEQ PO PACK
40.0000 meq | PACK | Freq: Two times a day (BID) | ORAL | Status: AC
  Administered 2021-10-08 (×2): 40 meq via ORAL
  Filled 2021-10-08 (×2): qty 2

## 2021-10-08 MED ORDER — BISACODYL 5 MG PO TBEC
5.0000 mg | DELAYED_RELEASE_TABLET | Freq: Every day | ORAL | Status: DC | PRN
  Administered 2021-10-08: 5 mg via ORAL
  Filled 2021-10-08: qty 1

## 2021-10-08 MED ORDER — TRAMADOL HCL 50 MG PO TABS
50.0000 mg | ORAL_TABLET | ORAL | Status: DC | PRN
  Administered 2021-10-08 – 2021-10-11 (×8): 50 mg via ORAL
  Filled 2021-10-08 (×8): qty 1

## 2021-10-08 MED ORDER — BOOST / RESOURCE BREEZE PO LIQD CUSTOM
1.0000 | Freq: Three times a day (TID) | ORAL | Status: DC
  Administered 2021-10-08 (×2): 1 via ORAL

## 2021-10-08 MED ORDER — POLYETHYLENE GLYCOL 3350 17 G PO PACK
17.0000 g | PACK | Freq: Two times a day (BID) | ORAL | Status: DC
  Administered 2021-10-08 (×2): 17 g via ORAL
  Filled 2021-10-08 (×2): qty 1

## 2021-10-08 MED ORDER — BISACODYL 10 MG RE SUPP: 10.0000 mg | Freq: Every day | RECTAL | Status: DC | PRN
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
## 2021-10-08 NOTE — Care Management Important Message (Signed)
Important Message  Patient Details  Name: Katherine Atkins MRN: [id number] Date of Birth: Jul 15, 1940   Medicare Important Message Given:  Yes     Sherilyn Banker 10/08/2021, 12:03 PM

## 2021-10-08 NOTE — Progress Notes (Signed)
Inpatient Rehabilitation Admissions Coordinator   Insurance has denied approval for [name] admit after peer to peer review with [place], [place]. I met with patient at bedside and she is aware. She would like to appeal decision and also she is checking with a friend at church, [employer], about assisting with daily care at home if she were to choose for direct discharge home. I will begin appeal today.  Danne Baxter, RN, MSN Rehab Admissions Coordinator [phone] 10/08/2021 11:12 AM

## 2021-10-08 NOTE — Anesthesia Postprocedure Evaluation (Signed)
Anesthesia Post Note  Patient: Katherine Atkins  Procedure(s) Performed: 1.  Left distal radius open reduction internal fixation, intra-articular, >3 fragments 2.   Left wrist brachioradialis tenotomy  3.   Left wrist radiographs four views with intraoperative interpretation   (Left: Wrist)     Patient location during evaluation: PACU Anesthesia Type: Regional and MAC Level of consciousness: awake and alert Pain management: pain level controlled Vital Signs Assessment: post-procedure vital signs reviewed and stable Respiratory status: spontaneous breathing, nonlabored ventilation, respiratory function stable and patient connected to nasal cannula oxygen Cardiovascular status: stable and blood pressure returned to baseline Postop Assessment: no apparent nausea or vomiting Anesthetic complications: no   No notable events documented.  Last Vitals:  Vitals:   10/07/21 2317 10/08/21 0320  BP: 136/73 (!) 158/88  Pulse: 60 68  Resp: 20 15  Temp: 36.6 C 36.8 C  SpO2: 92% 93%    Last Pain:  Vitals:   10/08/21 0320  TempSrc: Oral  PainSc:                  March Rummage Shamone Winzer
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
## 2021-10-09 MED ORDER — POLYETHYLENE GLYCOL 3350 17 G PO PACK
17.0000 g | PACK | Freq: Every day | ORAL | Status: DC
  Filled 2021-10-09 (×2): qty 1
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
## 2021-10-09 NOTE — Progress Notes (Signed)
Inpatient Rehabilitation Admissions Coordinator   I met with patient at bedside. She prefers [name] admit if approved on appeal, then discharge home. She states she realizes she will need rehab before returning home. I continue to await appeal determination.  Barbara Boyette, RN, MSN Rehab Admissions Coordinator [phone] 10/09/2021 3:37 PM  

## 2021-10-09 NOTE — TOC Progression Note (Addendum)
Transition of Care [facility]) - Progression Note    Patient Details  Name: Eline Geng MRN: [id number] Date of Birth: 04-29-1940  Transition of Care [facility]) CM/SW Contact  Glennon Mac, RN Phone Number: 10/09/2021, 2:50 PM  Clinical Narrative:    [name] appeal in progress; awaiting outcome.  Long conversation with patient regarding back up plan if appeal is denied. If denied, patient will likely prefer home discharge with [facility] and DME, pending available support.   TOC will continue to follow progress with therapies/outcome of [name] appeal.     Expected Discharge Plan: IP Rehab Facility Barriers to Discharge: Continued Medical Work up  Expected Discharge Plan and Services Expected Discharge Plan: IP Rehab Facility   Discharge Planning Services: CM Consult   Living arrangements for the past 2 months: Single Family Home                                       Social Determinants of Health (SDOH) Interventions    Readmission Risk Interventions     No data to display         Quintella Baton, RN, BSN  Trauma/Neuro ICU Case Manager [phone]
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
## 2021-10-11 ENCOUNTER — Encounter (HOSPITAL_COMMUNITY): Payer: Self-pay | Admitting: Physical Medicine & Rehabilitation

## 2021-10-11 ENCOUNTER — Inpatient Hospital Stay (HOSPITAL_COMMUNITY): Payer: Medicare Other

## 2021-10-11 ENCOUNTER — Other Ambulatory Visit: Payer: Self-pay

## 2021-10-11 ENCOUNTER — Inpatient Hospital Stay (HOSPITAL_COMMUNITY)
Admission: RE | Admit: 2021-10-11 | Discharge: 2021-10-26 | DRG: 561 | Disposition: A | Discharge status: Home / Self Care | Payer: Medicare Other | Source: Intra-hospital | Attending: Physical Medicine and Rehabilitation | Admitting: Physical Medicine and Rehabilitation

## 2021-10-11 DIAGNOSIS — S270XXD Traumatic pneumothorax, subsequent encounter: Secondary | ICD-10-CM | POA: Diagnosis not present

## 2021-10-11 DIAGNOSIS — R9389 Abnormal findings on diagnostic imaging of other specified body structures: Secondary | ICD-10-CM | POA: Diagnosis present

## 2021-10-11 DIAGNOSIS — S72432D Displaced fracture of medial condyle of left femur, subsequent encounter for closed fracture with routine healing: Secondary | ICD-10-CM

## 2021-10-11 DIAGNOSIS — S62102D Fracture of unspecified carpal bone, left wrist, subsequent encounter for fracture with routine healing: Secondary | ICD-10-CM | POA: Diagnosis not present

## 2021-10-11 DIAGNOSIS — S2241XD Multiple fractures of ribs, right side, subsequent encounter for fracture with routine healing: Secondary | ICD-10-CM

## 2021-10-11 DIAGNOSIS — Z8249 Family history of ischemic heart disease and other diseases of the circulatory system: Secondary | ICD-10-CM | POA: Diagnosis not present

## 2021-10-11 DIAGNOSIS — S329XXA Fracture of unspecified parts of lumbosacral spine and pelvis, initial encounter for closed fracture: Secondary | ICD-10-CM | POA: Diagnosis present

## 2021-10-11 DIAGNOSIS — M810 Age-related osteoporosis without current pathological fracture: Secondary | ICD-10-CM | POA: Diagnosis present

## 2021-10-11 DIAGNOSIS — M25462 Effusion, left knee: Secondary | ICD-10-CM | POA: Diagnosis present

## 2021-10-11 DIAGNOSIS — E876 Hypokalemia: Secondary | ICD-10-CM | POA: Diagnosis present

## 2021-10-11 DIAGNOSIS — S62102S Fracture of unspecified carpal bone, left wrist, sequela: Secondary | ICD-10-CM | POA: Diagnosis not present

## 2021-10-11 DIAGNOSIS — I714 Abdominal aortic aneurysm, without rupture, unspecified: Secondary | ICD-10-CM | POA: Diagnosis present

## 2021-10-11 DIAGNOSIS — S32119D Unspecified Zone I fracture of sacrum, subsequent encounter for fracture with routine healing: Secondary | ICD-10-CM | POA: Diagnosis not present

## 2021-10-11 DIAGNOSIS — S52502D Unspecified fracture of the lower end of left radius, subsequent encounter for closed fracture with routine healing: Principal | ICD-10-CM

## 2021-10-11 DIAGNOSIS — T07XXXA Unspecified multiple injuries, initial encounter: Secondary | ICD-10-CM | POA: Diagnosis not present

## 2021-10-11 DIAGNOSIS — K5900 Constipation, unspecified: Secondary | ICD-10-CM | POA: Diagnosis present

## 2021-10-11 DIAGNOSIS — D759 Disease of blood and blood-forming organs, unspecified: Secondary | ICD-10-CM | POA: Diagnosis present

## 2021-10-11 DIAGNOSIS — S32592D Other specified fracture of left pubis, subsequent encounter for fracture with routine healing: Secondary | ICD-10-CM | POA: Diagnosis not present

## 2021-10-11 DIAGNOSIS — R030 Elevated blood-pressure reading, without diagnosis of hypertension: Secondary | ICD-10-CM | POA: Diagnosis present

## 2021-10-11 DIAGNOSIS — R7989 Other specified abnormal findings of blood chemistry: Secondary | ICD-10-CM | POA: Diagnosis present

## 2021-10-11 DIAGNOSIS — Z79899 Other long term (current) drug therapy: Secondary | ICD-10-CM | POA: Diagnosis not present

## 2021-10-11 DIAGNOSIS — M25559 Pain in unspecified hip: Secondary | ICD-10-CM | POA: Diagnosis not present

## 2021-10-11 DIAGNOSIS — R5381 Other malaise: Secondary | ICD-10-CM | POA: Diagnosis present

## 2021-10-11 DIAGNOSIS — S52612D Displaced fracture of left ulna styloid process, subsequent encounter for closed fracture with routine healing: Secondary | ICD-10-CM | POA: Diagnosis not present

## 2021-10-11 DIAGNOSIS — I10 Essential (primary) hypertension: Secondary | ICD-10-CM | POA: Diagnosis not present

## 2021-10-11 LAB — COMPREHENSIVE METABOLIC PANEL
ALT: 89 U/L — ABNORMAL HIGH (ref 0–44)
AST: 93 U/L — ABNORMAL HIGH (ref 15–41)
Albumin: 2.8 g/dL — ABNORMAL LOW (ref 3.5–5.0)
Alkaline Phosphatase: 144 U/L — ABNORMAL HIGH (ref 38–126)
Anion gap: 6 (ref 5–15)
BUN: 15 mg/dL (ref 8–23)
CO2: 25 mmol/L (ref 22–32)
Calcium: 8.1 mg/dL — ABNORMAL LOW (ref 8.9–10.3)
Chloride: 106 mmol/L (ref 98–111)
Creatinine, Ser: 0.72 mg/dL (ref 0.44–1.00)
GFR, Estimated: >60 mL/min (ref >60)
Glucose, Bld: 114 mg/dL — ABNORMAL HIGH (ref 70–99)
Potassium: 3.7 mmol/L (ref 3.5–5.1)
Sodium: 137 mmol/L (ref 135–145)
Total Bilirubin: 1.8 mg/dL — ABNORMAL HIGH (ref 0.3–1.2)
Total Protein: 5.8 g/dL — ABNORMAL LOW (ref 6.5–8.1)

## 2021-10-11 LAB — CBC WITH DIFFERENTIAL/PLATELET
Abs Immature Granulocytes: 0.16 10*3/uL — ABNORMAL HIGH (ref 0.00–0.07)
Basophils Absolute: 0.1 10*3/uL (ref 0.0–0.1)
Basophils Relative: 1 %
Eosinophils Absolute: 0.3 10*3/uL (ref 0.0–0.5)
Eosinophils Relative: 4 %
HCT: 31.6 % — ABNORMAL LOW (ref 36.0–46.0)
Hemoglobin: 10.7 g/dL — ABNORMAL LOW (ref 12.0–15.0)
Immature Granulocytes: 2 %
Lymphocytes Relative: 22 %
Lymphs Abs: 1.8 10*3/uL (ref 0.7–4.0)
MCH: 31.7 pg (ref 26.0–34.0)
MCHC: 33.9 g/dL (ref 30.0–36.0)
MCV: 93.5 fL (ref 80.0–100.0)
Monocytes Absolute: 0.8 10*3/uL (ref 0.1–1.0)
Monocytes Relative: 10 %
Neutro Abs: 5.1 10*3/uL (ref 1.7–7.7)
Neutrophils Relative %: 61 %
Platelets: 215 10*3/uL (ref 150–400)
RBC: 3.38 MIL/uL — ABNORMAL LOW (ref 3.87–5.11)
RDW: 14.1 % (ref 11.5–15.5)
WBC: 8.3 10*3/uL (ref 4.0–10.5)
nRBC: 0.2 % (ref 0.0–0.2)

## 2021-10-11 MED ORDER — ENOXAPARIN SODIUM 30 MG/0.3ML IJ SOSY
30.0000 mg | PREFILLED_SYRINGE | Freq: Two times a day (BID) | INTRAMUSCULAR | Status: DC
  Administered 2021-10-11 – 2021-10-26 (×28): 30 mg via SUBCUTANEOUS
  Filled 2021-10-11 (×30): qty 0.3

## 2021-10-11 MED ORDER — BOOST / RESOURCE BREEZE PO LIQD CUSTOM
1.0000 | Freq: Three times a day (TID) | ORAL | Status: DC
  Administered 2021-10-11 – 2021-10-25 (×28): 1 via ORAL

## 2021-10-11 MED ORDER — LIDOCAINE 5 % EX PTCH
1.0000 | MEDICATED_PATCH | CUTANEOUS | Status: DC
  Administered 2021-10-11: 1 via TRANSDERMAL
  Filled 2021-10-11: qty 1

## 2021-10-11 MED ORDER — METHOCARBAMOL 500 MG PO TABS
1000.0000 mg | ORAL_TABLET | Freq: Three times a day (TID) | ORAL | Status: DC
  Administered 2021-10-11 – 2021-10-26 (×44): 1000 mg via ORAL
  Filled 2021-10-11 (×47): qty 2

## 2021-10-11 MED ORDER — ACETAMINOPHEN 325 MG PO TABS: 325.0000 mg | ORAL_TABLET | ORAL | Status: DC | PRN

## 2021-10-11 MED ORDER — TRAMADOL HCL 50 MG PO TABS
50.0000 mg | ORAL_TABLET | ORAL | Status: DC | PRN
  Administered 2021-10-11 – 2021-10-25 (×26): 50 mg via ORAL
  Filled 2021-10-11 (×26): qty 1

## 2021-10-11 MED ORDER — DIPHENHYDRAMINE HCL 12.5 MG/5ML PO ELIX: 12.5000 mg | ORAL_SOLUTION | Freq: Four times a day (QID) | ORAL | Status: DC | PRN

## 2021-10-11 MED ORDER — FLEET ENEMA 7-19 GM/118ML RE ENEM: 1.0000 | ENEMA | Freq: Once | RECTAL | Status: DC | PRN

## 2021-10-11 MED ORDER — DOCUSATE SODIUM 100 MG PO CAPS
100.0000 mg | ORAL_CAPSULE | Freq: Every day | ORAL | Status: DC
  Administered 2021-10-13 – 2021-10-14 (×2): 100 mg via ORAL
  Filled 2021-10-11 (×3): qty 1

## 2021-10-11 MED ORDER — MAGNESIUM HYDROXIDE 400 MG/5ML PO SUSP
30.0000 mL | Freq: Every day | ORAL | Status: DC | PRN
  Administered 2021-10-18: 30 mL via ORAL
  Filled 2021-10-11: qty 30

## 2021-10-11 MED ORDER — DOCUSATE SODIUM 100 MG PO CAPS: 100.0000 mg | ORAL_CAPSULE | Freq: Two times a day (BID) | ORAL | Status: DC

## 2021-10-11 MED ORDER — GUAIFENESIN-DM 100-10 MG/5ML PO SYRP: 5.0000 mL | ORAL_SOLUTION | Freq: Four times a day (QID) | ORAL | Status: DC | PRN

## 2021-10-11 MED ORDER — ORAL CARE MOUTH RINSE: 15.0000 mL | OROMUCOSAL | Status: DC | PRN

## 2021-10-11 MED ORDER — POLYETHYLENE GLYCOL 3350 17 G PO PACK
17.0000 g | PACK | Freq: Every day | ORAL | Status: DC
  Administered 2021-10-13: 17 g via ORAL
  Filled 2021-10-11 (×3): qty 1

## 2021-10-11 MED ORDER — HYDROCODONE-ACETAMINOPHEN 5-325 MG PO TABS
1.0000 | ORAL_TABLET | Freq: Four times a day (QID) | ORAL | Status: DC | PRN
  Administered 2021-10-11 – 2021-10-26 (×31): 1 via ORAL
  Filled 2021-10-11 (×34): qty 1

## 2021-10-11 MED ORDER — LIDOCAINE 5 % EX PTCH
3.0000 | MEDICATED_PATCH | CUTANEOUS | Status: DC
  Administered 2021-10-12: 3 via TRANSDERMAL
  Administered 2021-10-13 – 2021-10-14 (×2): 1 via TRANSDERMAL
  Administered 2021-10-15 – 2021-10-25 (×11): 3 via TRANSDERMAL
  Filled 2021-10-11 (×14): qty 3

## 2021-10-11 MED ORDER — TRAZODONE HCL 50 MG PO TABS
25.0000 mg | ORAL_TABLET | Freq: Every evening | ORAL | Status: DC | PRN
  Administered 2021-10-14 – 2021-10-25 (×9): 50 mg via ORAL
  Filled 2021-10-11 (×10): qty 1

## 2021-10-11 MED ORDER — PROCHLORPERAZINE EDISYLATE 10 MG/2ML IJ SOLN: 5.0000 mg | Freq: Four times a day (QID) | INTRAMUSCULAR | Status: DC | PRN

## 2021-10-11 MED ORDER — ALUM & MAG HYDROXIDE-SIMETH 200-200-20 MG/5ML PO SUSP
30.0000 mL | ORAL | Status: DC | PRN
  Administered 2021-10-23: 30 mL via ORAL
  Filled 2021-10-11: qty 30

## 2021-10-11 MED ORDER — AMLODIPINE BESYLATE 5 MG PO TABS
5.0000 mg | ORAL_TABLET | Freq: Every day | ORAL | Status: DC
Start: 2021-10-11 — End: 2021-10-11
  Administered 2021-10-11: 5 mg via ORAL
  Filled 2021-10-11: qty 1

## 2021-10-11 MED ORDER — ENOXAPARIN SODIUM 30 MG/0.3ML IJ SOSY: 30.0000 mg | PREFILLED_SYRINGE | Freq: Two times a day (BID) | INTRAMUSCULAR | Status: DC

## 2021-10-11 MED ORDER — PROCHLORPERAZINE 25 MG RE SUPP: 12.5000 mg | Freq: Four times a day (QID) | RECTAL | Status: DC | PRN

## 2021-10-11 MED ORDER — PROCHLORPERAZINE MALEATE 5 MG PO TABS: 5.0000 mg | ORAL_TABLET | Freq: Four times a day (QID) | ORAL | Status: DC | PRN

## 2021-10-11 NOTE — Progress Notes (Signed)
Inpatient Rehab Admissions Coordinator:   I have a [name] bed for this Pt. Today. (Pt.'s expedited appeal was approved) MD to enter d/c orders. RN may call report to [phone].  Megan Salon, MS, CCC-SLP Rehab Admissions Coordinator  [phone] (celll) [phone] (office)

## 2021-10-11 NOTE — Progress Notes (Signed)
Pt with d/c orders to [name]. Reported called all questions answered. Pt will be transported to 4W-02 via bed with all belongings.

## 2021-10-11 NOTE — Progress Notes (Signed)
XRAYS reviewed left wrist. Continue strict non weight bearing to left wrist. Ok for weight through elbow to facilitate mobilization. Elevate, finger ROM.   Mathis Dad, MD Orthopaedic Hand Surgeon
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
## 2021-10-11 NOTE — TOC Transition Note (Signed)
Transition of Care [facility]) - CM/SW Discharge Note   Patient Details  Name: Katherine Atkins MRN: [id number] Date of Birth: 1940/08/15  [facility] CM/SW Contact:  Bess Kinds, RN Phone Number: [phone] 10/11/2021, 8:43 AM   Clinical Narrative:     Authorization for received for [name] following appeal. [name] has bed today - patient accepted. No further TOC needs identified at this time.   Final next level of care: IP Rehab Facility Barriers to Discharge: No Barriers Identified   Patient Goals and CMS Choice Patient states their goals for this hospitalization and ongoing recovery are:: to go home      Discharge Placement                       Discharge Plan and Services   Discharge Planning Services: CM Consult                                 Social Determinants of Health (SDOH) Interventions     Readmission Risk Interventions     No data to display

## 2021-10-11 NOTE — Progress Notes (Signed)
Patient is doing well postoperatively status post left wrist open reduction internal fixation.  She has no pain in her wrist and actually has been using it quite a bit.  Her finger range of motion is full and swelling is down.  Splint remains in place and is clean dry and intact.  Sensation intact light touch to all tips of fingers with brisk capillary refill.  We will obtain new x-rays today due to patient stating that she  has been using her wrist quite a bit and has been mobilized with a platform walker.  Emphasized continue nonweightbearing through the wrist.  We will follow-up x-rays and will update plan based on these.

## 2021-10-11 NOTE — Progress Notes (Signed)
Inpatient Rehabilitation Admission Medication Review by a Pharmacist  A complete drug regimen review was completed for this patient to identify any potential clinically significant medication issues.  High Risk Drug Classes Is patient taking? Indication by Medication  Antipsychotic Yes Compazine - N/V  Anticoagulant Yes Lovenox - DVT ppx  Antibiotic No   Opioid Yes Norco - severe pain Tramadol - moderate pain  Antiplatelet No   Hypoglycemics/insulin No   Vasoactive Medication No   Chemotherapy No   Other Yes Lidocaine patch - pain Methocarbamol - muscle spasms Trazodone - sleep     Type of Medication Issue Identified Description of Issue Recommendation(s)  Drug Interaction(s) (clinically significant)     Duplicate Therapy     Allergy     No Medication Administration End Date     Incorrect Dose     Additional Drug Therapy Needed  Norvasc needs to be added based on discharge summary Will message PA in AM to add norvasc  Significant med changes from prior encounter (inform family/care partners about these prior to discharge).    Other       Clinically significant medication issues were identified that warrant physician communication and completion of prescribed/recommended actions by midnight of the next day:  Yes  Name of provider notified for urgent issues identified: Milinda Antis, PA-C  Provider Method of Notification: Secure chat    Pharmacist comments:   Time spent performing this drug regimen review (minutes):  15  Rockwell Alexandria, PharmD, [facility] PGY1 Pharmacy Resident 10/11/2021 1:19 PM

## 2021-10-12 DIAGNOSIS — I10 Essential (primary) hypertension: Secondary | ICD-10-CM

## 2021-10-12 DIAGNOSIS — S62102S Fracture of unspecified carpal bone, left wrist, sequela: Secondary | ICD-10-CM | POA: Diagnosis not present

## 2021-10-12 DIAGNOSIS — E876 Hypokalemia: Secondary | ICD-10-CM | POA: Diagnosis not present

## 2021-10-12 DIAGNOSIS — R7401 Elevation of levels of liver transaminase levels: Secondary | ICD-10-CM

## 2021-10-12 DIAGNOSIS — T07XXXA Unspecified multiple injuries, initial encounter: Secondary | ICD-10-CM | POA: Diagnosis not present

## 2021-10-12 MED ORDER — PROSIGHT PO TABS
1.0000 | ORAL_TABLET | Freq: Every day | ORAL | Status: DC
Start: 2021-10-12 — End: 2021-10-26
  Administered 2021-10-12 – 2021-10-26 (×15): 1 via ORAL
  Filled 2021-10-12 (×16): qty 1

## 2021-10-12 MED ORDER — AMLODIPINE BESYLATE 5 MG PO TABS
5.0000 mg | ORAL_TABLET | Freq: Every day | ORAL | Status: DC
  Administered 2021-10-12 – 2021-10-18 (×7): 5 mg via ORAL
  Filled 2021-10-12 (×7): qty 1
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
## 2021-10-12 NOTE — Progress Notes (Signed)
Greeted patient in bed, alert and oriented. Introduced self and oriented to rehab process. Talked about getting a cast and reported that will have to wait till swelling goes down and she is not to put any weight on LUE.  Discussed diet modifications to increase protein and calcium. All needs met. Call bell in reach.

## 2021-10-12 NOTE — Progress Notes (Signed)
Inpatient Rehabilitation  Patient information reviewed and entered into eRehab system by Anniebelle Devore Ayron Fillinger, OTR/L.   Information including medical coding, functional ability and quality indicators will be reviewed and updated through discharge.    

## 2021-10-12 NOTE — Progress Notes (Signed)
Orthopedic Tech Progress Note Patient Details:  Katherine Atkins [birth year] [id number]  Ortho Devices Type of Ortho Device: Cotton web roll, Ace wrap Ortho Device/Splint Location: LUE Ortho Device/Splint Interventions: Ordered, Application, Adjustment   Post Interventions Patient Tolerated: Well Instructions Provided: Care of device  [name] 10/12/2021, 4:04 PM
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
## 2021-10-12 NOTE — Progress Notes (Signed)
+/-   sleep. PRN vicodin given at 0118 and PRN ultram given at 2104 & 0606 for complaint of pelvic and rib pain. Splint with ace wrap in place to LUE. Patient reports, being told that ortho would be applying Atkins hard cast.? Requesting home med occuvite be restarted.Katherine Atkins

## 2021-10-12 NOTE — Progress Notes (Signed)
Inpatient Rehabilitation Admission Medication Review by a Pharmacist  A complete drug regimen review was completed for this patient to identify any potential clinically significant medication issues.  High Risk Drug Classes Is patient taking? Indication by Medication  Antipsychotic Yes Compazine - N/V  Anticoagulant Yes Lovenox - DVT ppx  Antibiotic No   Opioid Yes Norco - severe pain Tramadol - moderate pain  Antiplatelet No   Hypoglycemics/insulin No   Vasoactive Medication No   Chemotherapy No   Other Yes Lidocaine patch - pain Methocarbamol - muscle spasms Trazodone - sleep     Type of Medication Issue Identified Description of Issue Recommendation(s)  Drug Interaction(s) (clinically significant)     Duplicate Therapy     Allergy     No Medication Administration End Date     Incorrect Dose     Additional Drug Therapy Needed  Norvasc needs to be added based on discharge summary Will message PA in AM to add norvasc  Significant med changes from prior encounter (inform family/care partners about these prior to discharge).    Other       Clinically significant medication issues were identified that warrant physician communication and completion of prescribed/recommended actions by midnight of the next day:  Yes  Name of provider notified for urgent issues identified: Milinda Antis, PA-C  Provider Method of Notification: Secure chat  Amlodipine resumed.   Thank you for involving pharmacy in this patient's care.  [name] Back, PharmD, BCPS Clinical Pharmacist Clinical phone for 10/12/2021 until 3p is [phone] 10/12/2021 8:57 AM

## 2021-10-12 NOTE — Progress Notes (Signed)
PROGRESS NOTE   Subjective/Complaints: Had a fair night. Back pain d/t rib fx's.  Had questions about her splint which needs to be adjusted as her swelling has dissipated. Also wanted to know if she could get ocuvite, other vitamins  ROS: Patient denies fever, rash, sore throat, blurred vision, dizziness, nausea, vomiting, diarrhea, cough, shortness of breath or chest pain,  headache, or mood change.    Objective:   DG Wrist Complete Left  Result Date: 10/11/2021 CLINICAL DATA:  Left wrist fracture. EXAM: LEFT WRIST - COMPLETE 3+ VIEW COMPARISON:  10/05/2021 FINDINGS: Fine bony detail obscured by overlying plaster on some images. Patient is status post ORIF for distal radial metaphysis fracture. Bony alignment is improved compared to the prior pre reduction film. No evidence for immediate hardware complication. Ulnar styloid fracture evident. IMPRESSION: Status post ORIF for fracture of the distal radial metaphysis. Bony alignment is markedly improved compared to the pre reduction film with no evidence for immediate hardware complication. Electronically Signed   By: Kennith Center M.D.   On: 10/11/2021 10:10   Recent Labs    10/11/21 1225  WBC 8.3  HGB 10.7*  HCT 31.6*  PLT 215   Recent Labs    10/11/21 1225  NA 137  K 3.7  CL 106  CO2 25  GLUCOSE 114*  BUN 15  CREATININE 0.72  CALCIUM 8.1*    Intake/Output Summary (Last 24 hours) at 10/12/2021 1312 Last data filed at 10/12/2021 1130 Gross per 24 hour  Intake 480 ml  Output 400 ml  Net 80 ml        Physical Exam: Vital Signs Blood pressure (!) 140/84, pulse 78, temperature 98.6 F (37 C), temperature source Oral, resp. rate 16, height 5\' 2"  (1.575 m), weight 70.6 kg, SpO2 98 %.  General: Alert and oriented x 3, No apparent distress HEENT: Head is normocephalic, atraumatic, PERRLA, EOMI, sclera anicteric, oral mucosa pink and moist, dentition intact, ext ear canals  clear,  Neck: Supple without JVD or lymphadenopathy Heart: Reg rate and rhythm. No murmurs rubs or gallops Chest: CTA bilaterally without wheezes, rales, or rhonchi; no distress Abdomen: Soft, non-tender, non-distended, bowel sounds positive. Extremities: No clubbing, cyanosis . Pulses are 2+ Psych: Pt's affect is appropriate. Pt is cooperative Skin: scattered abrasions/bruising esp in LE's Neuro:  Alert and oriented x 3. Normal insight and awareness. Intact Memory. Normal language and speech. Cranial nerve exam unremarkable. Motor limited by ortho/pain left arm and leg. Sensory exam normal for light touch and pain in all 4 limbs. No limb ataxia or cerebellar signs. No abnormal tone appreciated.   Musculoskeletal: Left arm in splint which is loose. Bruising and swelling still at left knee.     Assessment/Plan: 1. Functional deficits which require 3+ hours per day of interdisciplinary therapy in a comprehensive inpatient rehab setting. Physiatrist is providing close team supervision and 24 hour management of active medical problems listed below. Physiatrist and rehab team continue to assess barriers to discharge/monitor patient progress toward functional and medical goals  Care Tool:  Bathing    Body parts bathed by patient: Right arm, Left arm, Chest, Abdomen, Face, Right upper leg, Left upper leg  Bathing assist Assist Level: Moderate Assistance - Patient 50 - 74%     Upper Body Dressing/Undressing Upper body dressing   What is the patient wearing?: Button up shirt    Upper body assist Assist Level: Set up assist    Lower Body Dressing/Undressing Lower body dressing      What is the patient wearing?: Pants     Lower body assist Assist for lower body dressing: Maximal Assistance - Patient 25 - 49%     Toileting Toileting    Toileting assist Assist for toileting: Maximal Assistance - Patient 25 - 49% (simulated during dressing)     Transfers Chair/bed  transfer  Transfers assist     Chair/bed transfer assist level: 2 Helpers     Locomotion Ambulation   Ambulation assist              Walk 10 feet activity   Assist           Walk 50 feet activity   Assist           Walk 150 feet activity   Assist           Walk 10 feet on uneven surface  activity   Assist           Wheelchair     Assist               Wheelchair 50 feet with 2 turns activity    Assist            Wheelchair 150 feet activity     Assist          Blood pressure (!) 140/84, pulse 78, temperature 98.6 F (37 C), temperature source Oral, resp. rate 16, height 5\' 2"  (1.575 m), weight 70.6 kg, SpO2 98 %.  Medical Problem List and Plan: 1. Functional deficits secondary to critical polytrauma; left wrist fracture/dislocation s/p ORIF, left femoral condyle fracture- NWB LUE distally and TDWB on LLE             -patient may  shower if they wrap LUE             -ELOS/Goals: 7-10 days mod I to supervision  -Patient is beginning [name] therapies today including PT and OT  2.  Antithrombotics: -DVT/anticoagulation:  Pharmaceutical: Lovenox             -antiplatelet therapy: n/a 3. Pain Management: Tylenol, robaxin, Toradol are scheduled; morphine and Ultram prn  -stop Toradol IV             -dc regular Tylenol due to elevated LFTs             -will con't tramadol and add Norco 5/325 mg for therapy to help control pain- also increase lidocaine patches to 3 patches at a time 12 hours on;12 hrs off 4. Mood/Behavior/Sleep: LCSW to evaluate and provide emotional support             -antipsychotic agents: n/a 5. Neuropsych/cognition: This patient is capable of making decisions on her own behalf. 6. Skin/Wound Care: local care 7. 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 High blood pressure- norvasc added-    -some improvement 8/7    LOS: 1 days A FACE TO FACE EVALUATION WAS PERFORMED  Ranelle Oyster 10/12/2021, 1:12 PM
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
## 2021-10-13 DIAGNOSIS — E876 Hypokalemia: Secondary | ICD-10-CM | POA: Diagnosis not present

## 2021-10-13 DIAGNOSIS — S62102S Fracture of unspecified carpal bone, left wrist, sequela: Secondary | ICD-10-CM | POA: Diagnosis not present

## 2021-10-13 DIAGNOSIS — T07XXXA Unspecified multiple injuries, initial encounter: Secondary | ICD-10-CM | POA: Diagnosis not present

## 2021-10-13 DIAGNOSIS — I10 Essential (primary) hypertension: Secondary | ICD-10-CM | POA: Diagnosis not present

## 2021-10-13 MED ORDER — OYSTER SHELL CALCIUM/D3 500-5 MG-MCG PO TABS
1.0000 | ORAL_TABLET | Freq: Two times a day (BID) | ORAL | Status: DC
Start: 2021-10-13 — End: 2021-10-26
  Administered 2021-10-13 – 2021-10-26 (×27): 1 via ORAL
  Filled 2021-10-13 (×27): qty 1

## 2021-10-13 MED ORDER — ENSURE MAX PROTEIN PO LIQD
11.0000 [oz_av] | Freq: Every day | ORAL | Status: DC
  Administered 2021-10-13 – 2021-10-24 (×11): 11 [oz_av] via ORAL

## 2021-10-13 NOTE — Progress Notes (Signed)
Patient ID: Katherine Atkins, female   DOB: 06/04/1940, 81 y.o.   MRN: [id number]  SW met with pt, pt house mate Pat, and friend Debbie to provide updates from team conference, and d/c date 8/21. Reports that there is not a need for a ramp as she states there is one step onto a platform, and then into the home. Family edu is pending when the friend Debbie is available as she accompanies Pat on her driving trips due to Pat's age.   [name], MSW, LCSWA Office: [phone] Cell: [phone] Fax: [phone]  

## 2021-10-13 NOTE — Progress Notes (Signed)
Physical Therapy Session Note  Patient Details  Name: Katherine Atkins MRN: [id number] Date of Birth: 1941-02-04  Today's Date: 10/13/2021 PT Individual Time: 1884-1660 PT Individual Time Calculation (min): 29 min   Short Term Goals: Week 1:  PT Short Term Goal 1 (Week 1): Pt will perform bed mobility with supervision PT Short Term Goal 2 (Week 1): Pt will perform basic transfers with supervision PT Short Term Goal 3 (Week 1): Pt will perform gait x 15' with min assist PT Short Term Goal 4 (Week 1): Pt will initiate stairs for home entry assessment  Skilled Therapeutic Interventions/Progress Updates:    Pt received sitting in recliner and agreeable to therapy session. Pt able to recall L UE and L LE WBing restrictions when questioned. Nurse in/out for medication administration. Sit>stand recliner>L PFRW (platform RW) with CGA for steadying - therapist cuing to extend L LE prior to initiating coming to stand to prevent Wbing and pt able to recall learning this with OT. R stand pivot recliner>w/c using L PFRW with CGA for steadying as pt pivots/shuffles on R foot rather than hopping. Transported to/from gym in w/c for time management and energy conservation.  Therapist provided visual demonstration and education on how to perform hop-to gait pattern using L PFRW to avoid L LE WBING and how to safely use L UE elbow support on the platform.  Gait training 32ft using L PFRW with 2x standing rest breaks and CGA for steadying/balance and +2 w/c follow to allow longer distance ambulation - pt primarily shuffles R foot forward with no true foot clearance but does achieve adequate step length using this pattern - pt states when she tries to lift her R foot more it causes a muscle pulling across her chest that is not comfortable.   Transported back towards room and pt participated in additional ~24ft gait training from w/c>recliner (1x standing rest) using L PFRW with continued CGA for steadying and same gait  mechanics as noted above.  At end of session, pt left seated in recliner with needs in reach, B LEs elevated, meal tray set-up, and seat belt alarm on.  Therapy Documentation Precautions:  Precautions Precautions: Fall Precaution Comments: rib fx, pelvic fx Required Braces or Orthoses: Splint/Cast Splint/Cast: LUE Restrictions Weight Bearing Restrictions: Yes LUE Weight Bearing: Non weight bearing LLE Weight Bearing: Touchdown weight bearing Other Position/Activity Restrictions: NWB L wrist   Pain: Pt comments about having had pain throughout the day but no complaints of pain during session.    Therapy/Group: Individual Therapy  Ginny Forth , PT, DPT, NCS, CSRS 10/13/2021, 12:45 PM

## 2021-10-13 NOTE — Care Management (Signed)
[facility] Individual Statement of Services  Patient Name:  Katherine Atkins  Date:  10/13/2021  Welcome to the [facility]patient [facility].  Our goal is to provide you with an individualized program based on your diagnosis and situation, designed to meet your specific needs.  With this comprehensive rehabilitation program, you will be expected to participate in at least 3 hours of rehabilitation therapies Monday-Friday, with modified therapy programming on the weekends.  Your rehabilitation program will include the following services:  Physical Therapy (PT), Occupational Therapy (OT), 24 hour per day rehabilitation nursing, Therapeutic Recreaction (TR), Psychology, Neuropsychology, Care Coordinator, Rehabilitation Medicine, Nutrition Services, Pharmacy Services, and Other  Weekly team conferences will be held on Tuesdays to discuss your progress.  Your Inpatient Rehabilitation Care Coordinator will talk with you frequently to get your input and to update you on team discussions.  Team conferences with you and your family in attendance may also be held.  Expected length of stay: 10-12 days    Overall anticipated outcome: Independent with an Assistive Device  Depending on your progress and recovery, your program may change. Your Inpatient Rehabilitation Care Coordinator will coordinate services and will keep you informed of any changes. Your Inpatient Rehabilitation Care Coordinator's name and contact numbers are listed  below.  The following services may also be recommended but are not provided by the Inpatient Rehabilitation Center:  Driving Evaluations Home Health Rehabiltiation Services Outpatient Rehabilitation Services Vocational Rehabilitation   Arrangements will be made to provide these services after discharge if needed.  Arrangements include referral to agencies that provide these services.  Your insurance has been verified to be:  [facility]  Your primary  doctor is:  Geoffry Paradise  Pertinent information will be shared with your doctor and your insurance company.  Inpatient Rehabilitation Care Coordinator:  Susie Cassette [phone] or ([phone]  Information discussed with and copy given to patient by: Gretchen Short, 10/13/2021, 9:24 AM

## 2021-10-13 NOTE — Progress Notes (Signed)
Occupational Therapy Session Note  Patient Details  Name: Katherine Atkins MRN: [id number] Date of Birth: January 10, 1941  Today's Date: 10/13/2021 OT Individual Time: 0141-0301 OT Individual Time Calculation (min): 59 min   Today's Date: 10/13/2021 OT Individual Time: 1300-1340 OT Individual Time Calculation (min): 40 min   Short Term Goals: Week 1:  OT Short Term Goal 1 (Week 1): Pt will complete 1/3 toileting steps with no more than min A for balance using LRAD OT Short Term Goal 2 (Week 1): Pt will donn LB clothing with min A using AE PRN OT Short Term Goal 3 (Week 1): Pt will complete ambulatory toilet transfer with min A to promote OOB toileting  Skilled Therapeutic Interventions/Progress Updates:    Session 1: Pt received in bed with 2 out of 10 pain in ribs. Pillow to hug during bed mobility and repositioning provided for pain relief  ADL: Pt completes ADL at overall MOD A Level. Skilled interventions include: edu re on adaptive leaning to cleanse buttocks at EOB, cuing for sequence of bed mobiltiiy to decrease WB through L wrist, cuing for WB precautions during scooting to EOB, edu re hemi technique for dressing with S for UB and MOD A for LB. MIN A to STS at EOB with L PFRW and set up for grooming seated in w/c.  Pt left at end of session in w/c with exit alarm on, call light in reach and all needs met  Session 2: Pt received in bed with "a little" pain during mobility otherwise repositioning sufficient for pain relief.  ADL: Skilled interventions include: edu re squat pivot transfers witih CGA but needing intervention to not push through L wrist (more practice needed), total A transport to/from courtyard where OT demos use of sock aide and reacher for footwear. Pt completes socks on and off with UE for doffing and sock aide for donnning with MIN cuing.   Pt left at end of session in bed with exit alarm on, call light in reach and all needs met   Therapy Documentation Precautions:   Precautions Precautions: Fall Precaution Comments: rib fx, pelvic fx Required Braces or Orthoses: Splint/Cast Splint/Cast: LUE Restrictions Weight Bearing Restrictions: Yes LUE Weight Bearing: Non weight bearing LLE Weight Bearing: Touchdown weight bearing Other Position/Activity Restrictions: NWB L wrist General:     Therapy/Group: Individual Therapy  Tonny Branch 10/13/2021, 6:51 AM

## 2021-10-13 NOTE — Progress Notes (Signed)
Occupational Therapy Session Note  Patient Details  Name: Katherine Atkins MRN: [id number] Date of Birth: 1941-01-19  Today's Date: 10/13/2021 OT Individual Time: 0349-1791 OT Individual Time Calculation (min): 60 min    Short Term Goals: Week 1:  OT Short Term Goal 1 (Week 1): Pt will complete 1/3 toileting steps with no more than min A for balance using LRAD OT Short Term Goal 2 (Week 1): Pt will donn LB clothing with min A using AE PRN OT Short Term Goal 3 (Week 1): Pt will complete ambulatory toilet transfer with min A to promote OOB toileting  Skilled Therapeutic Interventions/Progress Updates:    Patient agreeable to participate in OT session. Reports 2/10 pain level at start of session in left lower anterior hip/buttocks region .  Patient participated in skilled OT session focusing on bed mobility, functional transfers, adherence to weightbearing restrictions, safety awareness, and pain management techniques. Therapist assessed location of pain with min fascial restrictions noted. Pt completed all functional transfers using PFRW and Min A while therapist provided VC for form and technique such as flexing hips further forward prior to engaging glutes and pressing into left elbow and right arm to push UB up into standing in order to improve functional performance during sit<>stands in order to transfer from bed to w/c. Utilized mat table when focusing on bed mobility transition (seated to sidelying right, sidelying to back, back to sidelying, sidelying to seated EOB). Therapist provided VC for form, technique, and physical assist to bring BLE up onto/off of mat table. Pt experienced a high level of pain during initial transition of seated to right sidelying and therapist provided education for breathing techniques to help pt focus on decreasing pain. Positioning techniques were provided during remainder of bed mobility maneuvers with therapist providing 1 pillow plus bath blanket between knees and  ankles to allow for proper spine and hip alignment with pt encouraged to utilize same positioning techniques when completing bed mobility with nursing staff.  Pt educated and performed 360 breathing to assist with relaxation and pain management. Pt was able to demonstrate and verbalize understanding.  Pt reports a 5/10 pain level in left lower hip/buttocks region at end of session. After patient transferred to recliner at end of sessions, ice pack was provided for left hip/thigh.      Therapy Documentation Precautions:  Precautions Precautions: Fall Precaution Comments: rib fx, pelvic fx Required Braces or Orthoses: Splint/Cast Splint/Cast: LUE Restrictions Weight Bearing Restrictions: Yes LUE Weight Bearing: Non weight bearing LLE Weight Bearing: Touchdown weight bearing Other Position/Activity Restrictions: NWB L wrist   Therapy/Group: Individual Therapy  Limmie Patricia, OTR/L,CBIS  Supplemental OT - [facility]  10/13/2021, 7:58 AM

## 2021-10-13 NOTE — Patient Care Conference (Signed)
Inpatient RehabilitationTeam Conference and Plan of Care Update Date: 10/13/2021   Time: 10:02 AM    Patient Name: Katherine Atkins      Medical Record Number: [id number]  Date of Birth: 1940/11/02 Sex: Female         Room/Bed: 4W02C/4W02C-01 Payor Info: Payor: [employer] MEDICARE / Plan: [facility] MEDICARE / Product Type: *No Product type* /    Admit Date/Time:  10/11/2021 11:30 AM  Primary Diagnosis:  Critical polytrauma  Hospital Problems: Principal Problem:   Critical polytrauma    Expected Discharge Date:    Team Members Present:       Current Status/Progress Goal Weekly Team Focus  Bowel/Bladder   Continent of B/B. LBM 10/11/21  Maintain continence  Asssist with bathroom priveleges.   Swallow/Nutrition/ Hydration             ADL's   MOD to MAX A for ADLs, MIN A For transfers, poor adherence to UE WB precautions  S  improved safety awareness, adaptive dressing strategies, standing balance, activity tolernace   Mobility   min assist for transfers, limited ability to perform gait due to pain on eval (3' with min assist); supervision/min w/c mobility 50'; unable to attempt stairs  mod I txs, short distnace household gait, and w/c mobility; min assist stairs for home entry  progressing  transfers and gait, pain manangement, functional strengthening and endurance, initiation of stairs when able   Communication             Safety/Cognition/ Behavioral Observations            Pain   C/O of generalized pain from poly-trauma  <3/10. assess and treat.  Assess Q4 hrly and prn.   Skin   Generalized Ecchymosis ( Scattetered)  Prevent new breakdowns  Assess QS and prn     Discharge Planning:  Pt will d/c to home with housemate Pat. Dennie Bible is not able to provide any physical assistance. She will need to be Mod I at time of discharge.   Team Discussion: Polytrauma. Patient to return home with house mate. Patient will need to be MOD I at time of discharge. Patient is continent x 2 with  LBM 08/06. Patient is limited by pain. Needs cueing and reminders to follow precautions.  Patient on target to meet rehab goals: yes, patient min/ mod assist.   *See Care Plan and progress notes for long and short-term goals.   Revisions to Treatment Plan:  Monitor pain, reminders for precautions  Teaching Needs: Safety, medications, precautions, skin/wound care, gait/transfer training, etc.   Current Barriers to Discharge: Wound care, Lack of/limited family support, Weight bearing restrictions, and caregiver assistance, ramp  Possible Resolutions to Barriers: Family education, skin/wound care, weightbearing education, medication education, recommended DME     Medical Summary               I attest that I was present, lead the team conference, and concur with the assessment and plan of the team.   Jearld Adjutant 10/13/2021, 10:02 AM

## 2021-10-13 NOTE — Progress Notes (Addendum)
PROGRESS NOTE   Subjective/Complaints: She's working through pain. Asked questions about nutrition and if she should be taking supplements. Also asked about how quickly her bones would men  ROS: Patient denies fever, rash, sore throat, blurred vision, dizziness, nausea, vomiting, diarrhea, cough, shortness of breath or chest pain,  headache, or mood change.    Objective:   No results found.  Recent Labs    10/11/21 1225  WBC 8.3  HGB 10.7*  HCT 31.6*  PLT 215   Recent Labs    10/11/21 1225  NA 137  K 3.7  CL 106  CO2 25  GLUCOSE 114*  BUN 15  CREATININE 0.72  CALCIUM 8.1*    Intake/Output Summary (Last 24 hours) at 10/13/2021 1131 Last data filed at 10/13/2021 0933 Gross per 24 hour  Intake 596 ml  Output 300 ml  Net 296 ml        Physical Exam: Vital Signs Blood pressure (!) 142/77, pulse 88, temperature 98 F (36.7 C), temperature source Oral, resp. rate 16, height 5\' 2"  (1.575 m), weight 70.6 kg, SpO2 97 %.  Constitutional: No distress . Vital signs reviewed. HEENT: NCAT, EOMI, oral membranes moist Neck: supple Cardiovascular: RRR without murmur. No JVD    Respiratory/Chest: CTA Bilaterally without wheezes or rales. Normal effort    GI/Abdomen: BS +, non-tender, non-distended Ext: no clubbing, cyanosis  Psych: pleasant and cooperative  Skin: scattered abrasions/bruising esp in LE's,  Neuro:  Alert and oriented x 3. Normal insight and awareness. Intact Memory. Normal language and speech. Cranial nerve exam unremarkable. Motor limited by ortho/pain left arm and leg. Sensory exam normal for light touch and pain in all 4 limbs. No limb ataxia or cerebellar signs. No abnormal tone appreciated.   Musculoskeletal: Left arm in splint which is loose. Bruising and swelling still at left knee and thighs. Right torso tender to palpation   Assessment/Plan: 1. Functional deficits which require 3+ hours per day of  interdisciplinary therapy in a comprehensive inpatient rehab setting. Physiatrist is providing close team supervision and 24 hour management of active medical problems listed below. Physiatrist and rehab team continue to assess barriers to discharge/monitor patient progress toward functional and medical goals  Care Tool:  Bathing    Body parts bathed by patient: Right arm, Left arm, Chest, Abdomen, Face, Right upper leg, Left upper leg         Bathing assist Assist Level: Moderate Assistance - Patient 50 - 74%     Upper Body Dressing/Undressing Upper body dressing   What is the patient wearing?: Button up shirt    Upper body assist Assist Level: Set up assist    Lower Body Dressing/Undressing Lower body dressing      What is the patient wearing?: Pants     Lower body assist Assist for lower body dressing: Maximal Assistance - Patient 25 - 49%     Toileting Toileting    Toileting assist Assist for toileting: Maximal Assistance - Patient 25 - 49% (simulated during dressing)     Transfers Chair/bed transfer  Transfers assist     Chair/bed transfer assist level: Moderate Assistance - Patient 50 - 74%  Locomotion Ambulation   Ambulation assist      Assist level: Minimal Assistance - Patient > 75% Assistive device: Walker-rolling Max distance: 3'   Walk 10 feet activity   Assist  Walk 10 feet activity did not occur: Safety/medical concerns (unable due to endurance/pain)        Walk 50 feet activity   Assist Walk 50 feet with 2 turns activity did not occur: Safety/medical concerns  Assist level:  (unable due to endurance/pain)      Walk 150 feet activity   Assist Walk 150 feet activity did not occur: Safety/medical concerns         Walk 10 feet on uneven surface  activity   Assist Walk 10 feet on uneven surfaces activity did not occur: Safety/medical concerns         Wheelchair     Assist Is the patient using a  wheelchair?: Yes Type of Wheelchair: Manual      Max wheelchair distance: 9'    Wheelchair 50 feet with 2 turns activity    Assist        Assist Level: Contact Guard/Touching assist   Wheelchair 150 feet activity     Assist      Assist Level: Maximal Assistance - Patient 25 - 49%   Blood pressure (!) 142/77, pulse 88, temperature 98 F (36.7 C), temperature source Oral, resp. rate 16, height 5\' 2"  (1.575 m), weight 70.6 kg, SpO2 97 %.  Medical Problem List and Plan: 1. Functional deficits secondary to critical polytrauma; left wrist fracture/dislocation s/p ORIF, left femoral condyle fracture- NWB LUE distally and TDWB on LLE             -patient may  shower if they wrap LUE             -ELOS/Goals: 7-10 days mod I to supervision  -Continue [name] therapies including PT, OT. Team conference today 2.  Antithrombotics: -DVT/anticoagulation:  Pharmaceutical: Lovenox             -antiplatelet therapy: n/a 3. Pain Management:   robaxin,  and Ultram prn                -dc'ed regular Tylenol due to elevated LFTs             - con't tramadol and add Norco 5/325 mg for therapy to help control pain- also increase lidocaine patches to 3 patches at a time 12 hours on;12 hrs off 4. Mood/Behavior/Sleep: LCSW to evaluate and provide emotional support             -antipsychotic agents: n/a 5. Neuropsych/cognition: This patient is capable of making decisions on her own behalf. 6. Skin/Wound Care: local care 7. Fluids/Electrolytes/Nutrition: discussed nutrition. Boost shakes are fine. Will add protein supplement as well.  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 High blood pressure- norvasc added-    -some improvement 8/8    LOS: 2 days A FACE TO FACE EVALUATION  WAS PERFORMED  Ranelle Oyster 10/13/2021, 11:31 AM

## 2021-10-14 DIAGNOSIS — E876 Hypokalemia: Secondary | ICD-10-CM | POA: Diagnosis not present

## 2021-10-14 DIAGNOSIS — I10 Essential (primary) hypertension: Secondary | ICD-10-CM | POA: Diagnosis not present

## 2021-10-14 DIAGNOSIS — T07XXXA Unspecified multiple injuries, initial encounter: Secondary | ICD-10-CM | POA: Diagnosis not present

## 2021-10-14 DIAGNOSIS — S62102S Fracture of unspecified carpal bone, left wrist, sequela: Secondary | ICD-10-CM | POA: Diagnosis not present

## 2021-10-14 MED ORDER — DOCUSATE SODIUM 100 MG PO CAPS
100.0000 mg | ORAL_CAPSULE | Freq: Two times a day (BID) | ORAL | Status: DC
  Administered 2021-10-14 – 2021-10-19 (×10): 100 mg via ORAL
  Filled 2021-10-14 (×10): qty 1

## 2021-10-14 NOTE — Progress Notes (Signed)
PROGRESS NOTE   Subjective/Complaints: Overall doing fairly well. Pain remains an issue in back/pelvis   ROS: Patient denies fever, rash, sore throat, blurred vision, dizziness, nausea, vomiting, diarrhea, cough, shortness of breath or chest pain, joint or back/neck pain, headache, or mood change.     Objective:   No results found.  Recent Labs    10/11/21 1225  WBC 8.3  HGB 10.7*  HCT 31.6*  PLT 215   Recent Labs    10/11/21 1225  NA 137  K 3.7  CL 106  CO2 25  GLUCOSE 114*  BUN 15  CREATININE 0.72  CALCIUM 8.1*    Intake/Output Summary (Last 24 hours) at 10/14/2021 9326 Last data filed at 10/14/2021 0400 Gross per 24 hour  Intake 354 ml  Output 800 ml  Net -446 ml        Physical Exam: Vital Signs Blood pressure (!) 164/79, pulse 65, temperature 97.6 F (36.4 C), resp. rate 16, height 5\' 2"  (1.575 m), weight 70.6 kg, SpO2 96 %.  Constitutional: No distress . Vital signs reviewed. HEENT: NCAT, EOMI, oral membranes moist Neck: supple Cardiovascular: RRR without murmur. No JVD    Respiratory/Chest: CTA Bilaterally without wheezes or rales. Normal effort    GI/Abdomen: BS +, non-tender, non-distended Ext: no clubbing, cyanosis, or edema Psych: pleasant and cooperative  Skin: scattered abrasions/bruising esp in LE's,  Neuro:  Alert and oriented x 3. Normal insight and awareness. Intact Memory. Normal language and speech. Cranial nerve exam unremarkable. Motor limited by ortho/pain left arm and leg. Sensory exam normal for light touch and pain in all 4 limbs. No limb ataxia or cerebellar signs. No abnormal tone appreciated.   Musculoskeletal: Left arm in splint which is loose. Bruising and swelling still at left knee and thighs. Right torso tender to palpation   Assessment/Plan: 1. Functional deficits which require 3+ hours per day of interdisciplinary therapy in a comprehensive inpatient rehab  setting. Physiatrist is providing close team supervision and 24 hour management of active medical problems listed below. Physiatrist and rehab team continue to assess barriers to discharge/monitor patient progress toward functional and medical goals  Care Tool:  Bathing    Body parts bathed by patient: Right arm, Left arm, Chest, Abdomen, Face, Right upper leg, Left upper leg         Bathing assist Assist Level: Moderate Assistance - Patient 50 - 74%     Upper Body Dressing/Undressing Upper body dressing   What is the patient wearing?: Button up shirt    Upper body assist Assist Level: Set up assist    Lower Body Dressing/Undressing Lower body dressing      What is the patient wearing?: Pants     Lower body assist Assist for lower body dressing: Maximal Assistance - Patient 25 - 49%     Toileting Toileting    Toileting assist Assist for toileting: Maximal Assistance - Patient 25 - 49% (simulated during dressing)     Transfers Chair/bed transfer  Transfers assist     Chair/bed transfer assist level: Contact Guard/Touching assist (stand pivot) Chair/bed transfer assistive device: Armrests,   Ambulation assist  Assist level: Contact Guard/Touching assist Assistive device: Walker-platform Max distance: 75ft   Walk 10 feet activity   Assist  Walk 10 feet activity did not occur: Safety/medical concerns (unable due to endurance/pain)        Walk 50 feet activity   Assist Walk 50 feet with 2 turns activity did not occur: Safety/medical concerns  Assist level:  (unable due to endurance/pain)      Walk 150 feet activity   Assist Walk 150 feet activity did not occur: Safety/medical concerns         Walk 10 feet on uneven surface  activity   Assist Walk 10 feet on uneven surfaces activity did not occur: Safety/medical concerns         Wheelchair     Assist Is the patient using a wheelchair?:  Yes Type of Wheelchair: Manual      Max wheelchair distance: 78'    Wheelchair 50 feet with 2 turns activity    Assist        Assist Level: Contact Guard/Touching assist   Wheelchair 150 feet activity     Assist      Assist Level: Maximal Assistance - Patient 25 - 49%   Blood pressure (!) 164/79, pulse 65, temperature 97.6 F (36.4 C), resp. rate 16, height 5\' 2"  (1.575 m), weight 70.6 kg, SpO2 96 %.  Medical Problem List and Plan: 1. Functional deficits secondary to critical polytrauma; left wrist fracture/dislocation s/p ORIF, left femoral condyle fracture- NWB LUE distally and TDWB on LLE             -patient may  shower if they wrap LUE             -ELOS/Goals: 7-10 days mod I to supervision  -Continue [name] therapies including PT, OT  2.  Antithrombotics: -DVT/anticoagulation:  Pharmaceutical: Lovenox             -antiplatelet therapy: n/a 3. Pain Management:   robaxin,  and Ultram prn                -dc'ed regular Tylenol due to elevated LFTs             - con't tramadol and add Norco 5/325 mg for therapy to help control pain- also increase lidocaine patches to 3 patches at a time 12 hours on;12 hrs off 4. Mood/Behavior/Sleep: team providing emotional support             -antipsychotic agents: n/a 5. Neuropsych/cognition: This patient is capable of making decisions on her own behalf. 6. Skin/Wound Care: local care 7. Fluids/Electrolytes/Nutrition: discussed nutrition. Boost shakes are fine. Will add protein supplement as well.  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 High blood pressure- norvasc added 8/7  -8/9 may need further titration as sbp still elevated--obsv today    LOS: 3 days A FACE TO FACE EVALUATION WAS PERFORMED  11 May 10/14/2021, 8:22 AM

## 2021-10-14 NOTE — Progress Notes (Signed)
Occupational Therapy Session Note  Patient Details  Name: Katherine Atkins MRN: [id number] Date of Birth: 1940-08-02  Today's Date: 10/14/2021 OT Individual Time: 7493-5521 OT Individual Time Calculation (min): 60 min   Today's Date: 10/14/2021 OT Individual Time: 1330-1430 OT Individual Time Calculation (min): 60 min  Short Term Goals: Week 1:  OT Short Term Goal 1 (Week 1): Pt will complete 1/3 toileting steps with no more than min A for balance using LRAD OT Short Term Goal 2 (Week 1): Pt will donn LB clothing with min A using AE PRN OT Short Term Goal 3 (Week 1): Pt will complete ambulatory toilet transfer with min A to promote OOB toileting  Skilled Therapeutic Interventions/Progress Updates:     Pt received in recliner with no pain. Soreness in ribs thoruhgout. Rest and repositioning provided  ADL: Pt completes ADL at overall MIN A Level. Skilled interventions include: CGA-MIN A for trasfers with decr safety awareness with getting out of shower attempting to stand with no walker. Pt completes bathign with supervisio with cuing for lateral leans on tub bench and able to cross BLE to wash feet! Pt dons shirt with set up, pants with MIN A sit to stand with PFRW and 1 LOB laterally with MIN A to correct and pulls up pants with MIN A. Total A to don socks d/t time constraints. Cuing throughout for NMB RUE precuation.   Pt left at end of session in w/c with exit alarm on, call light in reach and all needs met  Session 2:  Pt received in recliner with soreness in ribs and pelvis. Ice used at end of sesison for pain relief  ADL: Pt completes shower transfer with TTB with CGA for SPT with PFRW on L and increased time/VC for problems solving scooting/swinging Les into shower stall while maintaining precautions  Therapeutic activity Blocked practice of lateral scoot/squat pivot to/from w/c with LUE maintained in lap with cuing to kick LLE out into dependent position with CGA.  Standing balance  activity 2x while maintaining LUE on PF of RW and reaching outside BOS for closepins at hip to above head height with no major LOB. Extended seated rest break provided d/t decreased activity tolerance  Pt left at end of session in recliner with exit alarm on, call light in reach and all needs met   Therapy Documentation Precautions:  Precautions Precautions: Fall Precaution Comments: rib fx, pelvic fx Required Braces or Orthoses: Splint/Cast Splint/Cast: LUE Restrictions Weight Bearing Restrictions: Yes LUE Weight Bearing: Non weight bearing LLE Weight Bearing: Touchdown weight bearing Other Position/Activity Restrictions: NWB L wrist   Therapy/Group: Individual Therapy  Tonny Branch 10/14/2021, 6:53 AM

## 2021-10-14 NOTE — IPOC Note (Signed)
Overall Plan of Care [facility]) Patient Details Name: Emary Zalar MRN: [id number] DOB: 1941-01-27  Admitting Diagnosis: Critical polytrauma  Hospital Problems: Principal Problem:   Critical polytrauma     Functional Problem List: Nursing Edema, Endurance, Medication Management, Motor, Pain, Safety, Skin Integrity  PT Balance, Edema, Endurance, Motor, Pain, Skin Integrity  OT Balance, Edema, Endurance, Pain, Safety  SLP    TR         Basic ADL's: OT Bathing, Dressing, Toileting     Advanced  ADL's: OT Simple Meal Preparation     Transfers: PT Bed Mobility, Bed to Chair, Car, Occupational psychologist, Research scientist (life sciences): PT Ambulation, Psychologist, prison and probation services, Stairs     Additional Impairments: OT Fuctional Use of Upper Extremity  SLP        TR      Anticipated Outcomes Item Anticipated Outcome  Self Feeding Indep  Swallowing      Basic self-care  Mod I  Toileting  Mod I   Bathroom Transfers Mod I  Bowel/Bladder  n/a  Transfers  mod I basic; supervision car transfer  Locomotion  mod I w/c mobility and short distance household gait; min assist stairs  Communication     Cognition     Pain  < 3  Safety/Judgment  mod i   Therapy Plan: PT Intensity: Minimum of 1-2 x/day ,45 to 90 minutes PT Frequency: 5 out of 7 days PT Duration Estimated Length of Stay: 10-12 days OT Intensity: Minimum of 1-2 x/day, 45 to 90 minutes OT Frequency: 5 out of 7 days OT Duration/Estimated Length of Stay: 10-12 days     Team Interventions: Nursing Interventions Patient/Family Education, Pain Management, Medication Management, Skin Care/Wound Management, Discharge Planning  PT interventions Ambulation/gait training, Warden/ranger, Community reintegration, Discharge planning, Disease management/prevention, DME/adaptive equipment instruction, Functional mobility training, Neuromuscular re-education, Pain management, Patient/family education, Psychosocial  support, Skin care/wound management, Splinting/orthotics, Therapeutic Activities, Stair training, Therapeutic Exercise, UE/LE Strength taining/ROM, UE/LE Coordination activities, Wheelchair propulsion/positioning  OT Interventions Balance/vestibular training, Discharge planning, Pain management, Self Care/advanced ADL retraining, Therapeutic Activities, UE/LE Coordination activities, Functional mobility training, Patient/family education, Therapeutic Exercise, DME/adaptive equipment instruction, Splinting/orthotics, UE/LE Strength taining/ROM, Wheelchair propulsion/positioning  SLP Interventions    TR Interventions    SW/CM Interventions Discharge Planning, Psychosocial Support, Patient/Family Education   Barriers to Discharge MD  Medical stability  Nursing Decreased caregiver support, Home environment access/layout, Wound Care, Lack of/limited family support, Weight bearing restrictions 1 level, 2 back steps, 6 front steps. Friend can provide supervision but not physical assist. Will hire additional assist.  PT Decreased caregiver support, Home environment access/layout, Weight bearing restrictions has stairs to enter and limited physical assistance available to help with this (multiple WB restrictions)  OT Home environment access/layout, Weight bearing restrictions, Lack of/limited family support    SLP      SW Decreased caregiver support, Lack of/limited family support, Community education officer for SNF coverage     Team Discharge Planning: Destination: PT-Home ,OT- Home , SLP-  Projected Follow-up: PT-Home health PT, OT-  Outpatient OT, SLP-  Projected Equipment Needs: PT-Wheelchair (measurements), Wheelchair cushion (measurements), Rolling walker with 5" wheels (with PF attachment), OT- To be determined, Tub/shower bench, SLP-  Equipment Details: PT-has access to SPC, OT-has BSC available Patient/family involved in discharge planning: PT- Patient,  OT-Patient, SLP-   MD ELOS: 10-12 days Medical Rehab  Prognosis:  Excellent Assessment: The patient has been admitted for [name] therapies with the diagnosis of polytrauma. The team will  be addressing functional mobility, strength, stamina, balance, safety, adaptive techniques and equipment, self-care, bowel and bladder mgt, patient and caregiver education, WB precautions, pain control, community reentry. Goals have been set at mod I to occasional supervision with self-care and mobility. Anticipated discharge destination is home.        See Team Conference Notes for weekly updates to the plan of care

## 2021-10-14 NOTE — Progress Notes (Signed)
Physical Therapy Session Note  Patient Details  Name: Katherine Atkins MRN: [id number] Date of Birth: October 01, 1940  Today's Date: 10/14/2021 PT Individual Time: 1032-1100 and 1630-1740 PT Individual Time Calculation (min): 28 min and 70 min   Short Term Goals: Week 1:  PT Short Term Goal 1 (Week 1): Pt will perform bed mobility with supervision PT Short Term Goal 2 (Week 1): Pt will perform basic transfers with supervision PT Short Term Goal 3 (Week 1): Pt will perform gait x 15' with min assist PT Short Term Goal 4 (Week 1): Pt will initiate stairs for home entry assessment Week 2:    Week 3:     Skilled Therapeutic Interventions/Progress Updates:  Session 1.  Pt received sitting in WC and agreeable to PT  Sit<>stand transfers with CGA-supervision assist from PT throughout session with PFRW.   Gait training with RW x 53ft with PFRW CGA-supervision assist from PT with cues for TDWB on the LLE.    WC mobility with hemi technique using RUE/RLE x 152ft and supervision assist from PT.  Patient returned to room and performed stand pivot to recliner with PFRW and CGA for safety . Pt left sitting in recliner with call bell in reach and all needs met.     Session 2.   Pt received sitting in recliner and agreeable to PT. Pt performed stand pivot transfer with *** and *** from PT for safety.   Stand pivot transfers   Supine therex  Patient returned to room and performed stand pivot to [facility] for urination.    Stand pivot transfer to [facility] with Pierpoint recliner with ***. Pt left sitting in recliner with call bell in reach and all needs met.         Therapy Documentation Precautions:  Precautions Precautions: Fall Precaution Comments: rib fx, pelvic fx Required Braces or Orthoses: Splint/Cast Splint/Cast: LUE Restrictions Weight Bearing Restrictions: Yes LUE Weight Bearing: Non weight bearing LLE Weight Bearing: Touchdown weight bearing Other Position/Activity Restrictions: NWB L  wrist General:   Vital Signs:  Pain: Pain Assessment Pain Scale: 0-10 Pain Score: 2  Pain Type: Acute pain Pain Location: Chest Pain Orientation: Mid Pain Descriptors / Indicators: Aching;Discomfort Pain Frequency: Intermittent Pain Onset: Gradual Patients Stated Pain Goal: 0 Pain Intervention(s): Medication (See eMAR) Mobility:   Locomotion :    Trunk/Postural Assessment :    Balance:   Exercises:   Other Treatments:      Therapy/Group: Individual Therapy  Lorie Phenix 10/14/2021, 12:00 PM

## 2021-10-14 NOTE — Progress Notes (Signed)
Pt received to room 4W02 at this time. Pt siting up in bed, call light in reach, no complaints voiced.

## 2021-10-15 DIAGNOSIS — I10 Essential (primary) hypertension: Secondary | ICD-10-CM | POA: Diagnosis not present

## 2021-10-15 DIAGNOSIS — R7989 Other specified abnormal findings of blood chemistry: Secondary | ICD-10-CM | POA: Diagnosis not present

## 2021-10-15 DIAGNOSIS — T07XXXA Unspecified multiple injuries, initial encounter: Secondary | ICD-10-CM | POA: Diagnosis not present

## 2021-10-15 DIAGNOSIS — M25559 Pain in unspecified hip: Secondary | ICD-10-CM

## 2021-10-15 NOTE — Progress Notes (Signed)
PROGRESS NOTE   Subjective/Complaints: Continues to have pain in back and pelvis but improved with current medications.   LBM yesterday  ROS: Patient denies fever, rash, sore throat, blurred vision, dizziness, nausea, vomiting, diarrhea, cough, shortness of breath or chest pain, joint or back/neck pain, headache, or mood change.     Objective:   No results found.  No results for input(s): "WBC", "HGB", "HCT", "PLT" in the last 72 hours.  No results for input(s): "NA", "K", "CL", "CO2", "GLUCOSE", "BUN", "CREATININE", "CALCIUM" in the last 72 hours.   Intake/Output Summary (Last 24 hours) at 10/15/2021 1321 Last data filed at 10/15/2021 0947 Gross per 24 hour  Intake 238 ml  Output 200 ml  Net 38 ml         Physical Exam: Vital Signs Blood pressure (!) 143/69, pulse 66, temperature 98.2 F (36.8 C), resp. rate 17, height 5\' 2"  (1.575 m), weight 70.6 kg, SpO2 96 %.  Constitutional: No distress . Vital signs reviewed. HEENT: NCAT, Conjugate gaze, oral membranes moist Neck: supple Cardiovascular: RRR without murmur. No JVD    Respiratory/Chest: CTA Bilaterally without wheezes or rales. Normal effort    GI/Abdomen: BS +, non-tender, non-distended Ext: no clubbing, cyanosis, or edema Psych: pleasant and cooperative  Skin: scattered abrasions/bruising esp in LE's,  Neuro:  Alert and oriented x 3. Normal insight and awareness. Intact Memory. Normal language and speech. Cranial nerve exam unremarkable. Motor limited by ortho/pain left arm and leg. Sensory exam normal for light touch and pain in all 4 limbs. No limb ataxia or cerebellar signs. No abnormal tone appreciated.   Musculoskeletal: Left arm in splint which is loose. Bruising and swelling still at left knee and thighs. Right torso tender to palpation   Assessment/Plan: 1. Functional deficits which require 3+ hours per day of interdisciplinary therapy in a  comprehensive inpatient rehab setting. Physiatrist is providing close team supervision and 24 hour management of active medical problems listed below. Physiatrist and rehab team continue to assess barriers to discharge/monitor patient progress toward functional and medical goals  Care Tool:  Bathing    Body parts bathed by patient: Right arm, Left arm, Chest, Abdomen, Face, Right upper leg, Left upper leg         Bathing assist Assist Level: Moderate Assistance - Patient 50 - 74%     Upper Body Dressing/Undressing Upper body dressing   What is the patient wearing?: Button up shirt    Upper body assist Assist Level: Set up assist    Lower Body Dressing/Undressing Lower body dressing      What is the patient wearing?: Pants     Lower body assist Assist for lower body dressing: Maximal Assistance - Patient 25 - 49%     Toileting Toileting    Toileting assist Assist for toileting: Maximal Assistance - Patient 25 - 49% (simulated during dressing)     Transfers Chair/bed transfer  Transfers assist     Chair/bed transfer assist level: Contact Guard/Touching assist Chair/bed transfer assistive device: Walker,   Ambulation assist      Assist level: Contact Guard/Touching assist Assistive device: Walker-platform Max distance: 25ft  Walk 10 feet activity   Assist  Walk 10 feet activity did not occur: Safety/medical concerns (unable due to endurance/pain)  Assist level: Contact Guard/Touching assist Assistive device: Walker-rolling   Walk 50 feet activity   Assist Walk 50 feet with 2 turns activity did not occur: Safety/medical concerns  Assist level:  (unable due to endurance/pain)      Walk 150 feet activity   Assist Walk 150 feet activity did not occur: Safety/medical concerns         Walk 10 feet on uneven surface  activity   Assist Walk 10 feet on uneven surfaces activity did not occur: Safety/medical  concerns         Wheelchair     Assist Is the patient using a wheelchair?: Yes Type of Wheelchair: Manual      Max wheelchair distance: 20'    Wheelchair 50 feet with 2 turns activity    Assist        Assist Level: Contact Guard/Touching assist   Wheelchair 150 feet activity     Assist      Assist Level: Maximal Assistance - Patient 25 - 49%   Blood pressure (!) 143/69, pulse 66, temperature 98.2 F (36.8 C), resp. rate 17, height 5\' 2"  (1.575 m), weight 70.6 kg, SpO2 96 %.  Medical Problem List and Plan: 1. Functional deficits secondary to critical polytrauma; left wrist fracture/dislocation s/p ORIF, left femoral condyle fracture- NWB LUE distally and TDWB on LLE             -patient may  shower if they wrap LUE             -ELOS/Goals: 8/21 mod I to supervision, min a stairs for home entry  -Continue [name] therapies including PT, OT  2.  Antithrombotics: -DVT/anticoagulation:  Pharmaceutical: Lovenox             -antiplatelet therapy: n/a 3. Pain Management:   robaxin,  and Ultram prn                -dc'ed regular Tylenol due to elevated LFTs             - con't tramadol and add Norco 5/325 mg for therapy to help control pain- also increase lidocaine patches to 3 patches at a time 12 hours on;12 hrs off  -8/10 Pain fairly well controlled, continue current medications 4. Mood/Behavior/Sleep: team providing emotional support             -antipsychotic agents: n/a 5. Neuropsych/cognition: This patient is capable of making decisions on her own behalf. 6. Skin/Wound Care: local care 7. Fluids/Electrolytes/Nutrition: discussed nutrition. Boost shakes are fine. Will add protein supplement as well.  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 High blood pressure- norvasc added 8/7  -8/9 may need further titration as sbp still elevated--obsv today  -8/10 improved, possibly pain related, continue to monitor  LOS: 4 days A FACE TO FACE EVALUATION WAS PERFORMED  Fanny Dance 10/15/2021, 1:21 PM

## 2021-10-15 NOTE — Progress Notes (Signed)
Physical Therapy Session Note  Patient Details  Name: Katherine Atkins MRN: [id number] Date of Birth: Jul 22, 1940  Today's Date: 10/15/2021 PT Individual Time: 0831-0930 PT Individual Time Calculation (min): 59 min   Short Term Goals: Week 1:  PT Short Term Goal 1 (Week 1): Pt will perform bed mobility with supervision PT Short Term Goal 2 (Week 1): Pt will perform basic transfers with supervision PT Short Term Goal 3 (Week 1): Pt will perform gait x 15' with min assist PT Short Term Goal 4 (Week 1): Pt will initiate stairs for home entry assessment  Skilled Therapeutic Interventions/Progress Updates:  Received pt semi-reclined in bed, pt agreeable to PT treatment, and denied any pain at rest (premedicated) - MD present for morning rounds and pt with numerous questions. Of note, pt required increased time with mobility this morning as pt moving extremely slow. Session with emphasis on functional mobility/transfers, dressing, generalized strengthening and endurance, and gait training. Pt transferred semi-reclined<>sitting EOB with HOB elevated and pulling on bedrail with RUE with supervision. Doffed gown and donned non-skid socks with total A and button up shirt and pants seated with supervision. Reviewed WB precautions in depth as pt with questions regarding how WB restrictions apply to functional tasks. Stood with L PFRW and CGA and required CGA to pull pants over hips. Pt transferred bed<>WC stand<>pivot with L PFRW and CGA. Sat in WC at sink and brushed teeth, washed face, combed hair, and applied deodorant with set up assist. Pt transported to/from room in [facility] dependently for time management purposes. Made adjustments to L PFRW and pt transferred sit<>stand with L PFRW and CGA and pt ambulated 9ft with L PFRW and CGA with +2 for WC follow due to availability - noted decreased R foot clearance with tendency to shuffle foot across floor. Pt limited by increased pain in pelvis and along chest. Returned to  room and stood with L PFRW and CGA and ambulated additional 64ft with L PFRW and CGA to recliner. Concluded session with pt sitting in recliner eating breakfast, needs within reach, and seatbelt alarm on. Provided pt with ice pack for pain management.   Therapy Documentation Precautions:  Precautions Precautions: Fall Precaution Comments: rib fx, pelvic fx Required Braces or Orthoses: Splint/Cast Splint/Cast: LUE Restrictions Weight Bearing Restrictions: Yes LUE Weight Bearing: Non weight bearing LLE Weight Bearing: Touchdown weight bearing Other Position/Activity Restrictions: NWB L wrist  Therapy/Group: Individual Therapy Martin Majestic PT, DPT  10/15/2021, 6:55 AM

## 2021-10-15 NOTE — Progress Notes (Signed)
Occupational Therapy Session Note  Patient Details  Name: Katherine Atkins MRN: [id number] Date of Birth: 09-01-1940  Today's Date: 10/15/2021 OT Individual Time: 1030-1130 OT Individual Time Calculation (min): 60 min   Today's Date: 10/15/2021 OT Individual Time: 1345-1415 OT Individual Time Calculation (min): 30 min   Short Term Goals: Week 1:  OT Short Term Goal 1 (Week 1): Pt will complete 1/3 toileting steps with no more than min A for balance using LRAD OT Short Term Goal 2 (Week 1): Pt will donn LB clothing with min A using AE PRN OT Short Term Goal 3 (Week 1): Pt will complete ambulatory toilet transfer with min A to promote OOB toileting  Skilled Therapeutic Interventions/Progress Updates:     Pt received in recliner with unrated pelvis pain and RN provided pain medication mid session. Ice provided for pain relief  Therapeutic activity Pt completes 2x30 ball toss (chest, bounce, overhead pass) in seated unsupported position with 1.5 # wrist weights applied at elbows to improve BUE coordination/strengthening required for BADLs/functional transfers.   Seated Wii bowling activity with RUE 1.5# wrist weight on to improve RUE ROM, strengthening and observe learning new task with no extra cuing needed. Pt tolerated well and smiled during activity. Pt declines participation in standing.   Functional transfers with increased time and PFRW with CGA-S and MIN cuing for strategy for scooting to EOM without leaning onto elbow/only pushing through R hip.   Pt left at end of session in recliner with exit alarm on, call light in reach and all needs met  Session 2: Pt received in recliner agreeable to OT. Occasional grimace with reaching d/t rib pain but subsides with repositioning Therapeutic activity Functional trnsfers with CGA overall. Pt completes bean bag toss in standing position with PFRW AD for balance and CGA A overall. Activity performed to improve dynamic balance and functional reach  in mod ranges outside BOS in prep for BADLs/IADLs.   Pt left at end of session in recliner with exit alarm on, call light in reach and all needs met    Therapy Documentation Precautions:  Precautions Precautions: Fall Precaution Comments: rib fx, pelvic fx Required Braces or Orthoses: Splint/Cast Splint/Cast: LUE Restrictions Weight Bearing Restrictions: Yes LUE Weight Bearing: Non weight bearing LLE Weight Bearing: Touchdown weight bearing Other Position/Activity Restrictions: NWB L wrist General:   Vital Signs: Therapy Vitals Temp: 98.2 F (36.8 C) Pulse Rate: 66 Resp: 17 BP: (!) 143/69 Patient Position (if appropriate): Sitting Oxygen Therapy SpO2: 96 % O2 Device: Room Air Pain: Pain Assessment Pain Scale: 0-10 Pain Score: 6  Pain Type: Acute pain Pain Location: Back Pain Orientation: Mid Pain Intervention(s): Medication (See eMAR) ADL: ADL Eating: Not assessed Grooming: Setup Where Assessed-Grooming: Sitting at sink Upper Body Bathing: Setup Where Assessed-Upper Body Bathing: Bed level Lower Body Bathing: Moderate assistance Where Assessed-Lower Body Bathing: Bed level, Edge of bed Upper Body Dressing: Setup Where Assessed-Upper Body Dressing: Edge of bed Lower Body Dressing: Maximal assistance Where Assessed-Lower Body Dressing: Edge of bed Toileting: Maximal assistance Where Assessed-Toileting: Other (Comment) (simulated) Toilet Transfer: Minimal assistance Toilet Transfer Method: Stand pivot Acupuncturist: Other (comment) (simulated to w/c) Tub/Shower Transfer: Unable to assess Tub/Shower Transfer Method: Unable to assess [employer] Transfer: Unable to assess [employer] Transfer Method: Unable to assess Vision   Perception    Praxis   Balance   Exercises:   Other Treatments:     Therapy/Group: Individual Therapy  Shon Hale 10/15/2021, 6:48 AM

## 2021-10-15 NOTE — Progress Notes (Signed)
Occupational Therapy Session Note  Patient Details  Name: Katherine Atkins MRN: [id number] Date of Birth: 01-15-1941  Today's Date: 10/15/2021 OT Individual Time: 1445-1530 OT Individual Time Calculation (min): 45 min    Short Term Goals: Week 1:  OT Short Term Goal 1 (Week 1): Pt will complete 1/3 toileting steps with no more than min A for balance using LRAD OT Short Term Goal 2 (Week 1): Pt will donn LB clothing with min A using AE PRN OT Short Term Goal 3 (Week 1): Pt will complete ambulatory toilet transfer with min A to promote OOB toileting  Skilled Therapeutic Interventions/Progress Updates:    Pt relaxing in recliner upon OT arrival. Agreeable for all treatment and reported no pain throughout. Pt did report discomfort in the L UE splint wrapped with ACE wrap that the edge of the fiberglass insert was rough and rubbing her R thenar region. OT carefully removed ACE and cues pt not to move distal UE while OT added a piece of foam bandage to the rough region then re-wrapped ACE. OT training in strategies for gentle retrograde massage to fingers, thumb and posterior upper arm with + teach back from pt as well as need for intermittent use of pillow prop for L UE elevation. Pt then able to perform baseline timed stands with 3 min intervals x 2 sets. Seated rest in between and no cues for L UE/LE WB precautions this visit. While in standing with CGA, pt able to complete gentle shoulder shrugs and blade squeezes for scap elevation and retraction 2 sets of 10 reps. Pt then reported PT was mentioning potential to raise PFRW but this RW does not raise. OT changed out L platform and raised RW 1" which pt reported improved positioning, comfort and alignment overall. OT left pt back in recliner and set chair pad alarm and placed all needs and call button within reach.   Therapy/Group: Individual Therapy  Vicenta Dunning 10/15/2021, 11:26 AM

## 2021-10-16 DIAGNOSIS — S62102D Fracture of unspecified carpal bone, left wrist, subsequent encounter for fracture with routine healing: Secondary | ICD-10-CM

## 2021-10-16 DIAGNOSIS — T07XXXA Unspecified multiple injuries, initial encounter: Secondary | ICD-10-CM | POA: Diagnosis not present

## 2021-10-16 DIAGNOSIS — E876 Hypokalemia: Secondary | ICD-10-CM | POA: Diagnosis not present

## 2021-10-16 DIAGNOSIS — R7989 Other specified abnormal findings of blood chemistry: Secondary | ICD-10-CM | POA: Diagnosis not present

## 2021-10-16 LAB — COMPREHENSIVE METABOLIC PANEL
ALT: 30 U/L (ref 0–44)
AST: 20 U/L (ref 15–41)
Albumin: 2.7 g/dL — ABNORMAL LOW (ref 3.5–5.0)
Alkaline Phosphatase: 139 U/L — ABNORMAL HIGH (ref 38–126)
Anion gap: 6 (ref 5–15)
BUN: 20 mg/dL (ref 8–23)
CO2: 26 mmol/L (ref 22–32)
Calcium: 8.7 mg/dL — ABNORMAL LOW (ref 8.9–10.3)
Chloride: 106 mmol/L (ref 98–111)
Creatinine, Ser: 0.6 mg/dL (ref 0.44–1.00)
GFR, Estimated: >60 mL/min (ref >60)
Glucose, Bld: 113 mg/dL — ABNORMAL HIGH (ref 70–99)
Potassium: 4.2 mmol/L (ref 3.5–5.1)
Sodium: 138 mmol/L (ref 135–145)
Total Bilirubin: 1.1 mg/dL (ref 0.3–1.2)
Total Protein: 5.7 g/dL — ABNORMAL LOW (ref 6.5–8.1)

## 2021-10-16 LAB — BILIRUBIN, DIRECT: Bilirubin, Direct: 0.2 mg/dL (ref 0.0–0.2)

## 2021-10-16 NOTE — Progress Notes (Signed)
PROGRESS NOTE   Subjective/Complaints: Continues to have pain in back and pelvis but improved with current medications.   LBM  8/11  ROS: Patient denies fever, rash, sore throat, blurred vision, dizziness, nausea, vomiting, diarrhea, cough, shortness of breath or chest pain, joint or back/neck pain, headache, or mood change.     Objective:   No results found.  No results for input(s): "WBC", "HGB", "HCT", "PLT" in the last 72 hours.  Recent Labs    10/16/21 0617  NA 138  K 4.2  CL 106  CO2 26  GLUCOSE 113*  BUN 20  CREATININE 0.60  CALCIUM 8.7*     Intake/Output Summary (Last 24 hours) at 10/16/2021 1205 Last data filed at 10/16/2021 0900 Gross per 24 hour  Intake 920 ml  Output 450 ml  Net 470 ml         Physical Exam: Vital Signs Blood pressure (!) 143/74, pulse 78, temperature 98.4 F (36.9 C), resp. rate 17, height 5\' 2"  (1.575 m), weight 70.6 kg, SpO2 96 %.  Constitutional: No distress . Working in gym with therapy. Vital signs reviewed. HEENT: NCAT, Conjugate gaze, oral membranes moist Neck: supple Cardiovascular: RRR without murmur. No JVD    Respiratory/Chest: CTA Bilaterally without wheezes or rales. Normal effort    GI/Abdomen: BS +, non-tender, non-distended Ext: no clubbing, cyanosis, or edema Psych: pleasant and cooperative  Skin: scattered abrasions/bruising esp in LE's,  Neuro:  Alert and oriented x 3. Normal insight and awareness. Intact Memory. Normal language and speech. Cranial nerve exam unremarkable. Motor limited by ortho/pain left arm and leg. Sensory exam normal for light touch and pain in all 4 limbs. No limb ataxia or cerebellar signs. No abnormal tone appreciated.   Musculoskeletal: Left arm in splint- has been rewrapped. Bruising and swelling still at left knee and thighs. Right torso tender to palpation   Assessment/Plan: 1. Functional deficits which require 3+ hours per  day of interdisciplinary therapy in a comprehensive inpatient rehab setting. Physiatrist is providing close team supervision and 24 hour management of active medical problems listed below. Physiatrist and rehab team continue to assess barriers to discharge/monitor patient progress toward functional and medical goals  Care Tool:  Bathing    Body parts bathed by patient: Right arm, Left arm, Chest, Abdomen, Face, Right upper leg, Left upper leg         Bathing assist Assist Level: Moderate Assistance - Patient 50 - 74%     Upper Body Dressing/Undressing Upper body dressing   What is the patient wearing?: Button up shirt    Upper body assist Assist Level: Set up assist    Lower Body Dressing/Undressing Lower body dressing      What is the patient wearing?: Pants     Lower body assist Assist for lower body dressing: Maximal Assistance - Patient 25 - 49%     Toileting Toileting    Toileting assist Assist for toileting: Independent with assistive device Assistive Device Comment: urinal   Transfers Chair/bed transfer  Transfers assist     Chair/bed transfer assist level: Contact Guard/Touching assist Chair/bed transfer assistive device: Walker, Landscape architect Ambulation   Ambulation assist  Assist level: Contact Guard/Touching assist Assistive device: Walker-platform Max distance: 74ft   Walk 10 feet activity   Assist  Walk 10 feet activity did not occur: Safety/medical concerns (unable due to endurance/pain)  Assist level: Contact Guard/Touching assist Assistive device: Walker-rolling   Walk 50 feet activity   Assist Walk 50 feet with 2 turns activity did not occur: Safety/medical concerns  Assist level:  (unable due to endurance/pain)      Walk 150 feet activity   Assist Walk 150 feet activity did not occur: Safety/medical concerns         Walk 10 feet on uneven surface  activity   Assist Walk 10 feet on uneven surfaces  activity did not occur: Safety/medical concerns         Wheelchair     Assist Is the patient using a wheelchair?: Yes Type of Wheelchair: Manual      Max wheelchair distance: 53'    Wheelchair 50 feet with 2 turns activity    Assist        Assist Level: Contact Guard/Touching assist   Wheelchair 150 feet activity     Assist      Assist Level: Maximal Assistance - Patient 25 - 49%   Blood pressure (!) 143/74, pulse 78, temperature 98.4 F (36.9 C), resp. rate 17, height 5\' 2"  (1.575 m), weight 70.6 kg, SpO2 96 %.  Medical Problem List and Plan: 1. Functional deficits secondary to critical polytrauma; left wrist fracture/dislocation s/p ORIF, left femoral condyle fracture- NWB LUE distally and TDWB on LLE             -patient may  shower if they wrap LUE             -ELOS/Goals: 8/21 mod I to supervision, min a stairs for home entry  -Continue [name] therapies including PT, OT  2.  Antithrombotics: -DVT/anticoagulation:  Pharmaceutical: Lovenox             -antiplatelet therapy: n/a 3. Pain Management:   robaxin,  and Ultram prn                -dc'ed regular Tylenol due to elevated LFTs             - con't tramadol and add Norco 5/325 mg for therapy to help control pain- also increase lidocaine patches to 3 patches at a time 12 hours on;12 hrs off  -8/10 Pain fairly well controlled, continue current medications 4. Mood/Behavior/Sleep: team providing emotional support             -antipsychotic agents: n/a 5. Neuropsych/cognition: This patient is capable of making decisions on her own behalf. 6. Skin/Wound Care: local care 7. Fluids/Electrolytes/Nutrition: discussed nutrition. Boost shakes are fine. Will add protein supplement as well.  -add calcium and D for bones 8: Left wrist fracture/dislocation s/p ORIF 8/1; in splint             -platform weight-bear, no WB through wrist  -re-wrapped splint 8/7  -Contacted ortho office 8/11 with message for Dr. 10/11,  pt has clinic f/u on 8/16 but will still be inpatient at that time, asked if he would like to see her while she is inpatient 9: Right rib fractures: pain control; FV/IS 10: Left pubic rami and left sacral ala fractures 11: Left medial femoral condyle fracture: TDWB LLE 12: Elevated LFTs: follow-up CMP; Tylenol only with hydrocodone  -ALT/ALT improved on 8/6 from prior, bili a little higher, recheck tomorrow  -Improved 8/11, AST  20, ALT 30 13: Hypokalemia- doing better after repletion 3.7- con't to monitor  -K+ 4.2 on 8/11, improved 14: Endometrial thickening: follow-up outpatient 15: 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 High blood pressure- norvasc added 8/7  -8/9 may need further titration as sbp still elevated--obsv today  -8/10 improved, possibly pain related, continue to monitor    LOS: 5 days A FACE TO FACE EVALUATION WAS PERFORMED  Fanny Dance 10/16/2021, 12:05 PM

## 2021-10-16 NOTE — Progress Notes (Signed)
Physical Therapy Session Note  Patient Details  Name: Katherine Atkins MRN: [id number] Date of Birth: 1940-12-04  Today's Date: 10/16/2021 PT Individual Time: 0800-0915 PT Individual Time Calculation (min): 75 min   Short Term Goals: Week 1:  PT Short Term Goal 1 (Week 1): Pt will perform bed mobility with supervision PT Short Term Goal 2 (Week 1): Pt will perform basic transfers with supervision PT Short Term Goal 3 (Week 1): Pt will perform gait x 15' with min assist PT Short Term Goal 4 (Week 1): Pt will initiate stairs for home entry assessment  Skilled Therapeutic Interventions/Progress Updates:    Pt received as hand off from NT on the way to bathroom. Pt performed ambulatory transferi with PFRW with CGA overall, with noted near LOB at times but pt was able to correct with AD and CGA. Pt noted to have continent B+B void, documented in flow sheet. Assist for hygiene and clothing management. ambulatory transfera back to w/c in same manner. Pt doffed gown and donned shorts and shirt with close supervision, CGA for standing to pull pants over hips. Pt then performed oral hygiene and morning grooming at w/c level with set up assist.   Pt transported to therapy gym for time management and energy conservation. Pt performed the following exercises to promote LE strength and endurance:  Sit to stand with CGA overall throughout Mini squats 3 x 12 Calf raises 4  x 10  MD in/out for morning rounds during session with pt resting in w/c.   Pt returned to room and performed CGA ambulatory transfer with PFRW. Pt remained in recliner and was left with all needs in reach and alarm active.    Therapy Documentation Precautions:  Precautions Precautions: Fall Precaution Comments: rib fx, pelvic fx Required Braces or Orthoses: Splint/Cast Splint/Cast: LUE Restrictions Weight Bearing Restrictions: Yes LUE Weight Bearing: Non weight bearing LLE Weight Bearing: Touchdown weight bearing Other  Position/Activity Restrictions: NWB L wrist General:       Therapy/Group: Individual Therapy  Juluis Rainier 10/16/2021, 8:11 AM

## 2021-10-16 NOTE — Progress Notes (Signed)
Occupational Therapy Session Note  Patient Details  Name: Katherine Atkins MRN: [id number] Date of Birth: June 25, 1940  Today's Date: 10/16/2021 OT Individual Time: 2458-0998 OT Individual Time Calculation (min): 71 min    Short Term Goals: Week 1:  OT Short Term Goal 1 (Week 1): Pt will complete 1/3 toileting steps with no more than min A for balance using LRAD OT Short Term Goal 2 (Week 1): Pt will donn LB clothing with min A using AE PRN OT Short Term Goal 3 (Week 1): Pt will complete ambulatory toilet transfer with min A to promote OOB toileting  Skilled Therapeutic Interventions/Progress Updates:     Pt received in recliner with no pain  Therapeutic activity Pt completes SPT throughotu session with PFRW and supervision with slightly improved foot clearance for hopping ~3 feet to pivot to w/c.  Pt completes bed in apartment transfer and educated on how to change the height of her bed by removing box spring or using guest bed room initialy to increase ease of bed mobility. Pt completes with overall supervision but somewhat resistant to techinqu of rolling and pushing up onto R side and instead lets legs off and sits up straight trying to pull onto her pants. Recliner transfers with supervision with no cuing needed for technique. Pt able to swing leg rests in/out of the way.   Seated bean bag toss with increaseing pain. Medication provided. Edu re use of reachee to obtain items off the flooe. Pt reports having one at home to use.   Pt left at end of session in bed with exit alarm on, call light in reach and all needs met   Therapy Documentation Precautions:  Precautions Precautions: Fall Precaution Comments: rib fx, pelvic fx Required Braces or Orthoses: Splint/Cast Splint/Cast: LUE Restrictions Weight Bearing Restrictions: Yes LUE Weight Bearing: Non weight bearing LLE Weight Bearing: Touchdown weight bearing Other Position/Activity Restrictions: NWB L  wrist General:  Therapy/Group: Individual Therapy  Tonny Branch 10/16/2021, 6:49 AM

## 2021-10-16 NOTE — Progress Notes (Signed)
Physical Therapy Session Note  Patient Details  Name: Katherine Atkins MRN: [id number] Date of Birth: 01/25/1941  Today's Date: 10/16/2021 PT Individual Time: 1418-1500 PT Individual Time Calculation (min): 42 min   Short Term Goals: Week 1:  PT Short Term Goal 1 (Week 1): Pt will perform bed mobility with supervision PT Short Term Goal 2 (Week 1): Pt will perform basic transfers with supervision PT Short Term Goal 3 (Week 1): Pt will perform gait x 15' with min assist PT Short Term Goal 4 (Week 1): Pt will initiate stairs for home entry assessment  Skilled Therapeutic Interventions/Progress Updates:     Pt received seated in recliner and agrees to therapy. Reports pain in L hip. Number not provided. PT provides rest breaks as needed to manage pain and also alerts RN, who provides pain meds and muscle relaxer. Pt performs sit to stand with L platform RW and CGA, with cues for hand placement and body mechanics. PT places foot under pt's L foot to ensure pt maintains TDWB status and pt does a good job of keeping weight off L lower extremity. Pt performs ambulatory tranfser to WC, x10', with L platform RW and CGA/minA and cues for sequencing. Pt performs stand step transfer to and from [facility] to mat to ensure that L platform RW is adjusted correctly. Pt then performs x25, x29, and x25 R heel raises with seated rest break. During rest breaks pt performs 3x15 LAQs with L lower extremity. Each exercise performed to fatigue. WC transport back to room. Stand step transfer back to recliner with CGA and cues for positioning and hand placement for safe stand to sit transfer. Pt left seated with alarm intact and all needs within reach.  Therapy Documentation Precautions:  Precautions Precautions: Fall Precaution Comments: rib fx, pelvic fx Required Braces or Orthoses: Splint/Cast Splint/Cast: LUE Restrictions Weight Bearing Restrictions: Yes LUE Weight Bearing: Non weight bearing LLE Weight Bearing:  Touchdown weight bearing Other Position/Activity Restrictions: NWB L wrist   Therapy/Group: Individual Therapy  Beau Fanny, PT, DPT 10/16/2021, 5:49 PM

## 2021-10-17 NOTE — Progress Notes (Signed)
Physical Therapy Session Note  Patient Details  Name: Katherine Atkins MRN: [id number] Date of Birth: 08-23-1940  Today's Date: 10/17/2021 PT Individual Time: 1350-1438 PT Individual Time Calculation (min): 48 min   Short Term Goals: Week 1:  PT Short Term Goal 1 (Week 1): Pt will perform bed mobility with supervision PT Short Term Goal 2 (Week 1): Pt will perform basic transfers with supervision PT Short Term Goal 3 (Week 1): Pt will perform gait x 15' with min assist PT Short Term Goal 4 (Week 1): Pt will initiate stairs for home entry assessment  Skilled Therapeutic Interventions/Progress Updates:  Pt received resting in recliner.  She denied pain.    Therapeutic exercises performed with LEs to increase strength for functional mobility : modified long sitting in recliner- 12 x 1 R straight leg raises, 20 x 1 L ankle pumps, bil adductor squeezes.  Seated in wc- 5 x 1 L long arc quad AA.  PT instructed pt in paced breathing during painful movements; she needed mod cues to follow through with this during session, although she stated that it was helpful.  Sit> stand to LPFRW with CGa.  Stand/step transfer to wc/recliner with CGA.  In ADL apt, pt practiced bed mobility on regular (high) bed.  Stand>< sit to high surface with mod cues, CGA.  R rail was already installed on bed.  Sit> supine onto R forearm with mod assist for bil LEs.  Supine> sit with brief use of railing, mod assist for bil LEs; pt attempted to bear weight through L hand x 2 during transitional movements.  In sitting, R lateral leans x 5 slowly to tolerance.  At end of session, pt resting in recliner with bil LEs elevated and ice pack under L buttock, seat pad alarm set.       Therapy Documentation Precautions:  Precautions Precautions: Fall Precaution Comments: rib fx, pelvic fx Required Braces or Orthoses: Splint/Cast Splint/Cast: LUE Restrictions Weight Bearing Restrictions: Yes LUE Weight Bearing: Non weight  bearing LLE Weight Bearing: Touchdown weight bearing Other Position/Activity Restrictions: NWB L wrist         Therapy/Group: Individual Therapy  Charlize Hathaway 10/17/2021, 3:58 PM

## 2021-10-17 NOTE — Progress Notes (Signed)
Occupational Therapy Session Note  Patient Details  Name: Katherine Atkins MRN: [id number] Date of Birth: 1940-06-03  Today's Date: 10/17/2021 OT Individual Time: 0930-1030 OT Individual Time Calculation (min): 60 min   Today's Date: 10/17/2021 OT Individual Time: 1135-1200 OT Individual Time Calculation (min): 25 min   Short Term Goals: Week 1:  OT Short Term Goal 1 (Week 1): Pt will complete 1/3 toileting steps with no more than min A for balance using LRAD OT Short Term Goal 2 (Week 1): Pt will donn LB clothing with min A using AE PRN OT Short Term Goal 3 (Week 1): Pt will complete ambulatory toilet transfer with min A to promote OOB toileting  Skilled Therapeutic Interventions/Progress Updates:    Session 1: Pt received in bed  ADL: Pt completes ADL at overall CGA Level. Skilled interventions include: pt completes bed mobility with MIN A for trunk elevation with cuing to use RUE only to push up, SPT throughout with CGA-S overall and MIN-MOD A lateral scoot to/from shower chair with RUE slipping on scoot out of shower reuiring increased A d/t LOB forward. Groooming/UB dressing at sink wit set up. Bathing with lateral leans with superviison cuing for technique, and LB dressing completed with CGA overall, but A to don shoe in w/c as pt unable ot manage heel even with shoe horn. Pt able to complete in recliner per yesterday report. Cuing throughout for adaptive strategies.    Pt left at end of session in recliner with exit alarm on, call light in reach and all needs met  Session 2: Pt received in bed with unrated L pelvis pain which is relieved in the recliner with repositioning.  ADL: Pt completes toileting/transfers to/from w/c/BSC with CGA and VC for removing LUE from [place] before sitting d/t pt complaining of the PF moving, however pt does not follow cue. Pt able to complete 3/3 components of toileting with supervision A. Pt washes hands at sink with set up.  There Act: Pt completes w/c  mobility with hemi technique weaving through cones to simulate tight turns and backing into a cone "parking space" to work on backwards mobility.   Pt left at end of session in recliner with exit alarm on, call light in reach and all needs met   Therapy Documentation Precautions:  Precautions Precautions: Fall Precaution Comments: rib fx, pelvic fx Required Braces or Orthoses: Splint/Cast Splint/Cast: LUE Restrictions Weight Bearing Restrictions: Yes LUE Weight Bearing: Non weight bearing LLE Weight Bearing: Touchdown weight bearing Other Position/Activity Restrictions: NWB L wrist General:    Therapy/Group: Individual Therapy  Tonny Branch 10/17/2021, 6:50 AM

## 2021-10-18 DIAGNOSIS — T07XXXA Unspecified multiple injuries, initial encounter: Secondary | ICD-10-CM | POA: Diagnosis not present

## 2021-10-18 MED ORDER — AMLODIPINE BESYLATE 10 MG PO TABS
10.0000 mg | ORAL_TABLET | Freq: Every day | ORAL | Status: DC
  Administered 2021-10-19 – 2021-10-26 (×8): 10 mg via ORAL
  Filled 2021-10-18 (×8): qty 1

## 2021-10-18 NOTE — Progress Notes (Signed)
Physical Therapy Session Note  Patient Details  Name: Katherine Atkins MRN: [id number] Date of Birth: 03-Feb-1941  Today's Date: 10/18/2021 PT Individual Time: 1110-1215 PT Individual Time Calculation (min): 65 min   Short Term Goals: Week 1:  PT Short Term Goal 1 (Week 1): Pt will perform bed mobility with supervision PT Short Term Goal 2 (Week 1): Pt will perform basic transfers with supervision PT Short Term Goal 3 (Week 1): Pt will perform gait x 15' with min assist PT Short Term Goal 4 (Week 1): Pt will initiate stairs for home entry assessment  Skilled Therapeutic Interventions/Progress Updates:     Patient in w/c in the room upon PT arrival. Patient alert and agreeable to PT session. Patient reported 2-4/10 pelvis and rib pain during session, RN made aware. PT provided repositioning, rest breaks, and distraction as pain interventions throughout session.   Spent increased time discussing home set-up and d/c planning with patient at beginning of session. Patient reports that she has access to a "home care" service and several neighbors that will be able to assist her with bumping her w/c up the steps to access her home at d/c. Discussed plans for home exit/entry for emergencies and healthcare appointments and plans for setting up assistance at these times. Patient reports she plans to use the guest bedroom, as the bed in lower and has iron bars to hold for support, but is also open to renting a hospital bed if needed at d/c. Stated that her house mate drives a 4 door sedan and that she will sit in either of the recliners in the home at d/c.   Therapeutic Activity: Bed Mobility: Patient performed supine to/from sit with min A on a mat table to simulate a firm mattress. Provided verbal cues for rolling through R side-lying to use her R arm for trunk control. Transfers: Patient performed sit to/from stand x2 and stand pivot w/c<>mat table and w/c>recliner with CGA. Provided verbal cues for  scooting forward x1 and pivot technique.   Gait Training:  Patient ambulated 8-10 feet x2, limited by upper extremity fatigue and reduced R foot clearance with hop-to technique using L platform RW with min A for AD management and balance. Ambulated with hop-to gait pattern on R with poor foot clearance and control at initial contact. Provided verbal cues for shoulder depression land use of scapular muscle activation to promote improved upper extremity use for R foot clearance. Patient maintained NWB on L lower extremity throughout.   Wheelchair Mobility:  Patient propelled wheelchair >100 feet with x1 sitting rest break, <30 sec, with CGA-supervision using R hemi-technique. Provided verbal cues for maintaining NWB of L upper extremity x1, steering technique, and turning technique.   Patient maintained all precautions without cues with all mobility.  Patient in recliner in the room at end of session with breaks locked, seat belt alarm set, and all needs within reach.   Therapy Documentation Precautions:  Precautions Precautions: Fall Precaution Comments: rib fx, pelvic fx Required Braces or Orthoses: Splint/Cast Splint/Cast: LUE Restrictions Weight Bearing Restrictions: Yes LUE Weight Bearing: Non weight bearing LLE Weight Bearing: Touchdown weight bearing Other Position/Activity Restrictions: NWB L wrist    Therapy/Group: Individual Therapy  Kary Colaizzi L Schyler Butikofer PT, DPT, NCS, CBIS  10/18/2021, 3:41 PM

## 2021-10-18 NOTE — Progress Notes (Signed)
Occupational Therapy Session Note  Patient Details  Name: Katherine Atkins MRN: [id number] Date of Birth: Nov 17, 1940  Today's Date: 10/18/2021 OT Individual Time: 1349-1445 OT Individual Time Calculation (min): 56 min    Short Term Goals: Week 1:  OT Short Term Goal 1 (Week 1): Pt will complete 1/3 toileting steps with no more than min A for balance using LRAD OT Short Term Goal 2 (Week 1): Pt will donn LB clothing with min A using AE PRN OT Short Term Goal 3 (Week 1): Pt will complete ambulatory toilet transfer with min A to promote OOB toileting  Skilled Therapeutic Interventions/Progress Updates:  Pt greeted seated in recliner, pt agreeable to OT intervention.  Lengthy conversation about plan for pt to complete shower transfer at home. Pt describes a walkin shower with a step down into shower, suggested pt use TTB with leg rests adjusted so pt doesn't have to step down into shower and can scoot across on TTB. Pt concerned that her BLEs wont touch the floor once shes sitting on the seat in the shower however explained that pt would be sitting during the duration of the shower and standing in the shower isn't recommended at this time d/t WB restrictions, also suggested that pt complete first shower transfer at home with [facility].  Pt with questions about HH and how she picks an agency etc and general HH role in POC, answered all questions with pt verbalizing understanding.  Pt completed ambulatory transfer to w/c ~ 5 ft with PRFW and CGA. Total A transport to ADL apt where pt completed ambulatory transfer to flat [facility] with PFRW and CGA. After completing sit<>supine both pt and OTA decided that pt would benefit from bed rail on R side of bed and yoga blocks to assist with scooting hips backwards in bed on L side ( yoga blocks placed under L elbow when scooting backwards) and bed rail on R to assist with elevating trunk into sitting as pt reports this is the most painful part of mobility.  Education also  provided on use of gait belt as leg lifter as pt currently uses her shorts to maneuver LLE in/out of bed, also provided education on using yoga blocks to assist with pushing up from low sofa on L side.  Pt transported back to room in w/c with total A. Pt left seated in recliner with alarm belt activated and all needs within reach.   Therapy Documentation Precautions:  Precautions Precautions: Fall Precaution Comments: rib fx, pelvic fx Required Braces or Orthoses: Splint/Cast Splint/Cast: LUE Restrictions Weight Bearing Restrictions: Yes LUE Weight Bearing: Non weight bearing LLE Weight Bearing: Touchdown weight bearing Other Position/Activity Restrictions: NWB L wrist  Pain: unrated pain reported in LLE, rest breaks provided as needed.    Therapy/Group: Individual Therapy  Barron Schmid 10/18/2021, 3:36 PM

## 2021-10-18 NOTE — Progress Notes (Signed)
PROGRESS NOTE   Subjective/Complaints:   LBM  8/11, no pain c/os , discussed BP, states she took it at home and it was higher than 120s but did not remember exactly   ROS: Patient denies CP, SOB, N/V/D  Objective:   No results found.  No results for input(s): "WBC", "HGB", "HCT", "PLT" in the last 72 hours.  Recent Labs    10/16/21 0617  NA 138  K 4.2  CL 106  CO2 26  GLUCOSE 113*  BUN 20  CREATININE 0.60  CALCIUM 8.7*     Intake/Output Summary (Last 24 hours) at 10/18/2021 0825 Last data filed at 10/18/2021 0420 Gross per 24 hour  Intake 360 ml  Output 200 ml  Net 160 ml         Physical Exam: Vital Signs Blood pressure (!) 150/86, pulse 66, temperature 98.3 F (36.8 C), temperature source Oral, resp. rate 18, height 5\' 2"  (1.575 m), weight 70.6 kg, SpO2 96 %.   General: No acute distress Mood and affect are appropriate Heart: Regular rate and rhythm no rubs murmurs or extra sounds Lungs: Clear to auscultation, breathing unlabored, no rales or wheezes Abdomen: Positive bowel sounds, soft nontender to palpation, nondistended Extremities: No clubbing, cyanosis, or edema Skin: No evidence of breakdown, no evidence of rash   Neuro:  Alert and oriented x 3. Normal insight and awareness. Intact Memory. Normal language and speech. Cranial nerve exam unremarkable. Motor limited by ortho/pain left arm and leg. Sensory exam normal for light touch and pain in all 4 limbs. No limb ataxia or cerebellar signs. No abnormal tone appreciated.   Musculoskeletal: Left arm in splint- has been rewrapped. Bruising and swelling still at left knee and thighs. Right torso tender to palpation   Assessment/Plan: 1. Functional deficits which require 3+ hours per day of interdisciplinary therapy in a comprehensive inpatient rehab setting. Physiatrist is providing close team supervision and 24 hour management of active medical  problems listed below. Physiatrist and rehab team continue to assess barriers to discharge/monitor patient progress toward functional and medical goals  Care Tool:  Bathing    Body parts bathed by patient: Right arm, Left arm, Chest, Abdomen, Face, Right upper leg, Left upper leg         Bathing assist Assist Level: Moderate Assistance - Patient 50 - 74%     Upper Body Dressing/Undressing Upper body dressing   What is the patient wearing?: Button up shirt    Upper body assist Assist Level: Set up assist    Lower Body Dressing/Undressing Lower body dressing      What is the patient wearing?: Pants     Lower body assist Assist for lower body dressing: Maximal Assistance - Patient 25 - 49%     Toileting Toileting Toileting Activity did not occur (Clothing management and hygiene only): Refused  Toileting assist Assist for toileting: Independent with assistive device Assistive Device Comment: urinal   Transfers Chair/bed transfer  Transfers assist     Chair/bed transfer assist level: Contact Guard/Touching assist Chair/bed transfer assistive device: Walker, Armrests   Locomotion Ambulation   Ambulation assist      Assist level: Contact Guard/Touching assist  Assistive device: Walker-platform Max distance: 59ft   Walk 10 feet activity   Assist  Walk 10 feet activity did not occur: Safety/medical concerns (unable due to endurance/pain)  Assist level: Contact Guard/Touching assist Assistive device: Walker-rolling   Walk 50 feet activity   Assist Walk 50 feet with 2 turns activity did not occur: Safety/medical concerns  Assist level:  (unable due to endurance/pain)      Walk 150 feet activity   Assist Walk 150 feet activity did not occur: Safety/medical concerns         Walk 10 feet on uneven surface  activity   Assist Walk 10 feet on uneven surfaces activity did not occur: Safety/medical concerns         Wheelchair     Assist  Is the patient using a wheelchair?: Yes Type of Wheelchair: Manual      Max wheelchair distance: 46'    Wheelchair 50 feet with 2 turns activity    Assist        Assist Level: Contact Guard/Touching assist   Wheelchair 150 feet activity     Assist      Assist Level: Maximal Assistance - Patient 25 - 49%   Blood pressure (!) 150/86, pulse 66, temperature 98.3 F (36.8 C), temperature source Oral, resp. rate 18, height 5\' 2"  (1.575 m), weight 70.6 kg, SpO2 96 %.  Medical Problem List and Plan: 1. Functional deficits secondary to critical polytrauma; left wrist fracture/dislocation s/p ORIF, left femoral condyle fracture- NWB LUE distally and TDWB on LLE             -patient may  shower if they wrap LUE             -ELOS/Goals: 8/21 mod I to supervision, min a stairs for home entry  -Continue [name] therapies including PT, OT  2.  Antithrombotics: -DVT/anticoagulation:  Pharmaceutical: Lovenox             -antiplatelet therapy: n/a 3. Pain Management:   robaxin,  and Ultram prn                -dc'ed regular Tylenol due to elevated LFTs             - con't tramadol and add Norco 5/325 mg for therapy to help control pain- also increase lidocaine patches to 3 patches at a time 12 hours on;12 hrs off  -8/10 Pain fairly well controlled, continue current medications 4. Mood/Behavior/Sleep: team providing emotional support             -antipsychotic agents: n/a 5. Neuropsych/cognition: This patient is capable of making decisions on her own behalf. 6. Skin/Wound Care: local care 7. Fluids/Electrolytes/Nutrition: discussed nutrition. Boost shakes are fine. Will add protein supplement as well.  -add calcium and D for bones 8: Left wrist fracture/dislocation s/p ORIF 8/1; in splint             -platform weight-bear, no WB through wrist  -re-wrapped splint 8/7  -Contacted ortho office 8/11 with message for Dr. 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 High blood pressure- norvasc added 8/7   Vitals:   10/17/21 1949 10/18/21 0407  BP: (!) 154/69 (!) 150/86  Pulse: 69 66  Resp: 18 18  Temp: 98.7 F (37.1 C) 98.3 F (36.8 C)  SpO2: 99% 96%   Increase amlodipine to 10mg  qd- no prior hx of HTN discussed reducing salt intake     LOS: 7 days A FACE TO FACE EVALUATION WAS PERFORMED  10/18/2021, 8:25 AM

## 2021-10-18 NOTE — Progress Notes (Signed)
Patient doing very well postoperatively status post left wrist open reduction internal fixation.  She has absolutely no pain with short arm splint is clean dry and intact she has full finger range of motion.  Sensation intact light touch and brisk cap refill to all fingertips.  She is mobilizing well with a platform walker.  She should be strict nonweightbearing to the wrist however can platform weight-bear through the elbow.  Laney Pastor will follow-up with me upon discharge from rehab in my office.

## 2021-10-19 DIAGNOSIS — E876 Hypokalemia: Secondary | ICD-10-CM | POA: Diagnosis not present

## 2021-10-19 DIAGNOSIS — I10 Essential (primary) hypertension: Secondary | ICD-10-CM | POA: Diagnosis not present

## 2021-10-19 DIAGNOSIS — S62102S Fracture of unspecified carpal bone, left wrist, sequela: Secondary | ICD-10-CM | POA: Diagnosis not present

## 2021-10-19 DIAGNOSIS — T07XXXA Unspecified multiple injuries, initial encounter: Secondary | ICD-10-CM | POA: Diagnosis not present

## 2021-10-19 LAB — CBC
HCT: 30.9 % — ABNORMAL LOW (ref 36.0–46.0)
Hemoglobin: 10.2 g/dL — ABNORMAL LOW (ref 12.0–15.0)
MCH: 32 pg (ref 26.0–34.0)
MCHC: 33 g/dL (ref 30.0–36.0)
MCV: 96.9 fL (ref 80.0–100.0)
Platelets: 390 10*3/uL (ref 150–400)
RBC: 3.19 MIL/uL — ABNORMAL LOW (ref 3.87–5.11)
RDW: 15.3 % (ref 11.5–15.5)
WBC: 7 10*3/uL (ref 4.0–10.5)
nRBC: 0 % (ref 0.0–0.2)

## 2021-10-19 LAB — BASIC METABOLIC PANEL
Anion gap: 5 (ref 5–15)
BUN: 18 mg/dL (ref 8–23)
CO2: 27 mmol/L (ref 22–32)
Calcium: 8.4 mg/dL — ABNORMAL LOW (ref 8.9–10.3)
Chloride: 106 mmol/L (ref 98–111)
Creatinine, Ser: 0.61 mg/dL (ref 0.44–1.00)
GFR, Estimated: >60 mL/min (ref >60)
Glucose, Bld: 109 mg/dL — ABNORMAL HIGH (ref 70–99)
Potassium: 4 mmol/L (ref 3.5–5.1)
Sodium: 138 mmol/L (ref 135–145)

## 2021-10-19 MED ORDER — POLYETHYLENE GLYCOL 3350 17 G PO PACK
17.0000 g | PACK | Freq: Every day | ORAL | Status: DC
Start: 2021-10-19 — End: 2021-10-26
  Administered 2021-10-19 – 2021-10-25 (×7): 17 g via ORAL
  Filled 2021-10-19 (×8): qty 1

## 2021-10-19 MED ORDER — SENNOSIDES-DOCUSATE SODIUM 8.6-50 MG PO TABS
2.0000 | ORAL_TABLET | Freq: Every day | ORAL | Status: DC
  Administered 2021-10-19 – 2021-10-25 (×7): 2 via ORAL
  Filled 2021-10-19 (×7): qty 2

## 2021-10-19 NOTE — Progress Notes (Signed)
Physical Therapy Weekly Progress Note  Patient Details  Name: Katherine Atkins MRN: [id number] Date of Birth: [birth year]  Beginning of progress report period: October 12, 2021 End of progress report period: October 19, 2021  Today's Date: 10/19/2021 PT Individual Time: 0511-0211 and 1415-1520 PT Individual Time Calculation (min): 45 min and 65 min  Patient has met 2 of 4 short term goals.  Patient with slow, but steady progress this week, limited by pain and functional deficits secondary to polytrauma. She currently performs bed mobility with min A, transfers with supervision using L PFRW, gait 15 feet with CGA with L PFRW, and w/c mobility with supervision >50 feet using R hemi-technique. Patient with limited caregiver support at home and 2+1 STE her home. Have discussed receiving assist from neighbors and hired home care attendant for home entry. Patient education ongoing and caregiver education to be scheduled prior to discharge.   Patient continues to demonstrate the following deficits muscle weakness and muscle joint tightness, decreased cardiorespiratoy endurance, and decreased sitting balance, decreased standing balance, decreased postural control, decreased balance strategies, and difficulty maintaining precautions and therefore will continue to benefit from skilled PT intervention to increase functional independence with mobility.  Patient progressing toward long term goals.  Plan of care revisions: d/c stair goal with plans for caregivers to bump patient up the steps in the w/c.  PT Short Term Goals Week 1:  PT Short Term Goal 1 (Week 1): Pt will perform bed mobility with supervision PT Short Term Goal 1 - Progress (Week 1): Progressing toward goal PT Short Term Goal 2 (Week 1): Pt will perform basic transfers with supervision PT Short Term Goal 2 - Progress (Week 1): Met PT Short Term Goal 3 (Week 1): Pt will perform gait x 15' with min assist PT Short Term Goal 3 - Progress (Week 1):  Met PT Short Term Goal 4 (Week 1): Pt will initiate stairs for home entry assessment PT Short Term Goal 4 - Progress (Week 1): Not progressing Week 2:  PT Short Term Goal 1 (Week 2): STG=LTG due to ELOS.  Skilled Therapeutic Interventions/Progress Updates:     Session 1: Patient in bed upon PT arrival. Patient alert and agreeable to PT session. Patient reported 5/10 L pelvic pain during session, RN made aware. PT provided repositioning, rest breaks, and distraction as pain interventions throughout session.   Patient requested to shower this morning, OT not scheduled until afternoon. Focused session on transfer training sitting tolerance and in room mobility while performing bathing and dressing.   Therapeutic Activity: Bed Mobility: Patient performed supine to sit with supervision with use of hospital bed functions. Provided cues for log roll technique to reduce rib pain and improve R arm leverage for sitting up. Transfers: Patient performed squat pivot bed>w/c and w/c<>tub transfer bench with close supervision using arm rests or grab bars while maintaining weight bearing precautions with min cues. She performed sit to/from stand x2 with supervision using L PFRW. Provided verbal cues for releasing her L hand on descent x1.  Gait Training:  Patient ambulated 15 feet using L PFRW with CGA in the room. Ambulated with hop-to gait pattern on R with poor foot clearance and control at initial contact. Provided verbal cues for shoulder depression land use of scapular muscle activation to promote improved upper extremity use for R foot clearance. Patient maintained NWB on L lower extremity throughout.   Wheelchair Mobility:  Patient propelled wheelchair within the room using R hemi-body technique to retrieve clothing and  showering items with supervision. Provided verbal cues for turning technique and use of breaks.   Bathing/Dressing: Covered patient's L upper extremity splint and secured coving prior  to shower. Patient completed showering while seated on a TTB with set-up assist. Tolerated sitting with supervision-mod I for sitting balance >15 min. Patient doffed a hospital gown with mod A for untying and donned a shirt with set-up assist. Donned pants with total A for threading lower extremities and supervision for standing balance while pulling them up in standing.   Patient in recliner in the room at end of session with breaks locked, seat belt alarm set, and all needs within reach.   Session 2: Patient in recliner in the room upon PT arrival. Patient alert and agreeable to PT session. Patient reported 8/10 L pelvic pain during session, RN made aware and provided pain meds during session. PT provided repositioning, rest breaks, and distraction as pain interventions throughout session. Focused session on pain management and education to reduce elevated pain levels.   Patient reports increased pain following previous PT session, specifically with L LAQ exercise. Described the pain as shooting and intense aching between her R SI joint and ischial tuberosity. Assessed hamstring length with significant increase in pain at ~15 deg SLR in supine. Reviewed patient's pelvic x-rays, educating patient on the anatomy and fracture locations. Educated on hamstring muscle movements and placing the muscle on stretch with the attachment site at the ischial tuberosity near the fracture site of her inferior pubic rami. Educated on stretching and muscle soreness versus pain related to tissue injury and instructed patient to maintain in a pain free range with exercise, outside of muscle soreness or stretch. Patient wrote down these instructions for improve recall and stated understanding.  Applied Kinesiotape over L lateral gastroc using lymphatic drainage technique with the aim of edema management over hematoma site. Prior to application, patient denied any history of skin irritation or allergy to adhesive. Educated  patient on purpose of kinesiotape placement and signs symptoms of allergic reaction or irritation. Cleaned patient's skin and applied test strip at beginning of session and removed at end of session without sings of skin irritation. Instructed patient that the tape can be left on up to 3 days and can be worn in the shower. Instructed to removed the tape if peeling off, signs of skin irritation arise, or it has been on for >3 days. Informed patient that the tape is best removed in the shower or with a wet wash cloth. Patient stated understanding. Rehab team informed about tape placement to assist with monitoring patient's response.  Therapeutic Activity: Bed Mobility: Patient performed sit to supine with min A for lower extremity management in a flat bed. Provided verbal cues for sequencing and maintaining L upper extremity NWB. Transfers: Patient performed stand pivot recliner>bed with close supervision using L PFRW. Provided verbal cues for safe use of AD due to mild impulsivity. Patient requested therapist to assist her hip in standing for bruising. Mild bruising over lateral hip, but no erythema or discoloration over area that patient was complaining of pain between her SI joint and ischial tuberosity. Noted very mild increased edema compared to the R side.   Patient in bed with pain becoming "more manageable" at end of session with breaks locked, bed alarm set, and all needs within reach.   Therapy Documentation Precautions:  Precautions Precautions: Fall Precaution Comments: rib fx, pelvic fx Required Braces or Orthoses: Splint/Cast Splint/Cast: LUE Restrictions Weight Bearing Restrictions: Yes LUE Weight  Bearing: Non weight bearing LLE Weight Bearing: Touchdown weight bearing Other Position/Activity Restrictions: NWB L wrist  Therapy/Group: Individual Therapy  Jann Milkovich L Keyly Baldonado PT, DPT, NCS, CBIS  10/19/2021, 12:58 PM

## 2021-10-19 NOTE — Progress Notes (Signed)
Physical Therapy Session Note  Patient Details  Name: Katherine Atkins MRN: [id number] Date of Birth: 1940/07/19  Today's Date: 10/19/2021 PT Individual Time: 1046-1200 PT Individual Time Calculation (min): 74 min   Short Term Goals: Week 1:  PT Short Term Goal 1 (Week 1): Pt will perform bed mobility with supervision PT Short Term Goal 2 (Week 1): Pt will perform basic transfers with supervision PT Short Term Goal 3 (Week 1): Pt will perform gait x 15' with min assist PT Short Term Goal 4 (Week 1): Pt will initiate stairs for home entry assessment  Skilled Therapeutic Interventions/Progress Updates: Pt presents sitting in recliner and agreeable to therapy.  Pt performed LE there ex for increased strength.  Pt performed calf raises 3 x 15, LAQ 3 x 10-15, isometric add 3 x 15, and hip flexion 3 x 10 w/ AAROM LLE.  Pt educated on breathing techniques during performance.  Pt required seated rest breaks 2/2 increased pain to L pelvis.  Pt transferred sit to stand multiple trials during session w/ CGA.  Pt amb x 10' to w/c and then performed w/c mobility x 100' w/ r hemi technique.  Pt amb multiple trials x 25' w/ 1 standing rest break.  Pt required verbal cues for UE use to increase foot clearance R.  Pt requires seated rest breaks 2/2 UE fatigue.  Pt returned to room and amb x 15 to recliner.  Chair alarm on and all needs in reach.     Therapy Documentation Precautions:  Precautions Precautions: Fall Precaution Comments: rib fx, pelvic fx Required Braces or Orthoses: Splint/Cast Splint/Cast: LUE Restrictions Weight Bearing Restrictions: Yes LUE Weight Bearing: Non weight bearing LLE Weight Bearing: Touchdown weight bearing Other Position/Activity Restrictions: NWB L wrist General:   Vital Signs:   Pain:0/10 but w/ activity increases to 8/10 to pelvis. Pain Assessment Pain Scale: 0-10 Pain Score: 8  Pain Location: Hip Pain Orientation: Left Pain Intervention(s): Medication (See  eMAR) Mobility:     Therapy/Group: Individual Therapy  Lucio Edward 10/19/2021, 12:30 PM

## 2021-10-19 NOTE — Progress Notes (Signed)
Occupational Therapy Session Note  Patient Details  Name: Katherine Atkins MRN: [id number] Date of Birth: 04/30/1940  Today's Date: 10/19/2021 OT Individual Time: 1300-1345 OT Individual Time Calculation (min): 45 min    Short Term Goals: Week 1:  OT Short Term Goal 1 (Week 1): Pt will complete 1/3 toileting steps with no more than min A for balance using LRAD OT Short Term Goal 2 (Week 1): Pt will donn LB clothing with min A using AE PRN OT Short Term Goal 3 (Week 1): Pt will complete ambulatory toilet transfer with min A to promote OOB toileting  Skilled Therapeutic Interventions/Progress Updates:    Upon OT arrival, pt seated in recliner reporting pain in L hip at 5/10. Pt reports she recently received pain medications and is agreeable to OT treatment. Treatment intervention with a focus on shower transfer training. Pt completes scoot pivot transfer from recliner to w/c with SBA and was transported to rehab apartment via w/c and total A for time management. Therapist and pt discussed pt's bathroom setup and this therapist provided verbal and visual demonstration of safe transfer technique with simulated shower. Increased time required. Pt then completes shower transfer onto tub transfer bench with use of PFRW and CGA demonstrating safe approach throughout. Pt had no LOB. Pt completes transfer back to w/c with PFRW and CGA and was transported back to her room with via w/c and Total A for time. Pt reports the urge to use the bathroom and was left in w/c with nurse tech to complete.   Therapy Documentation Precautions:  Precautions Precautions: Fall Precaution Comments: rib fx, pelvic fx Required Braces or Orthoses: Splint/Cast Splint/Cast: LUE Restrictions Weight Bearing Restrictions: Yes LUE Weight Bearing: Non weight bearing LLE Weight Bearing: Touchdown weight bearing Other Position/Activity Restrictions: NWB L wrist   Therapy/Group: Individual Therapy  Carolin Sicks 10/19/2021, 2:19 PM

## 2021-10-19 NOTE — Progress Notes (Signed)
PROGRESS NOTE   Subjective/Complaints:  C/o constipation. Would like more medication to treat. Otherwise doing fairly well.   ROS: Patient denies fever, rash, sore throat, blurred vision, dizziness, nausea, vomiting, diarrhea, cough, shortness of breath or chest pain, joint or back/neck pain, headache, or mood change.   Objective:   No results found.  Recent Labs    10/19/21 0521  WBC 7.0  HGB 10.2*  HCT 30.9*  PLT 390   Recent Labs    10/19/21 0521  NA 138  K 4.0  CL 106  CO2 27  GLUCOSE 109*  BUN 18  CREATININE 0.61  CALCIUM 8.4*    Intake/Output Summary (Last 24 hours) at 10/19/2021 3419 Last data filed at 10/19/2021 0856 Gross per 24 hour  Intake 720 ml  Output 400 ml  Net 320 ml        Physical Exam: Vital Signs Blood pressure 133/70, pulse 71, temperature 98.6 F (37 C), temperature source Oral, resp. rate 16, height 5\' 2"  (1.575 m), weight 70.6 kg, SpO2 96 %.   Constitutional: No distress . Vital signs reviewed. HEENT: NCAT, EOMI, oral membranes moist Neck: supple Cardiovascular: RRR without murmur. No JVD    Respiratory/Chest: CTA Bilaterally without wheezes or rales. Normal effort    GI/Abdomen: BS +, non-tender, non-distended Ext: no clubbing, cyanosis, or edema Psych: pleasant and cooperative  Skin: No evidence of breakdown, no evidence of rash   Neuro:  Alert and oriented x 3. Normal insight and awareness. Intact Memory. Normal language and speech. Cranial nerve exam unremarkable. Motor limited by ortho/pain left arm and leg. Sensory exam normal for light touch and pain in all 4 limbs. No limb ataxia or cerebellar signs. No abnormal tone appreciated.   Musculoskeletal: Left arm in splint which is fitting. Bruising and swelling still at left knee and thighs but improved. Right torso tender to palpation   Assessment/Plan: 1. Functional deficits which require 3+ hours per day of  interdisciplinary therapy in a comprehensive inpatient rehab setting. Physiatrist is providing close team supervision and 24 hour management of active medical problems listed below. Physiatrist and rehab team continue to assess barriers to discharge/monitor patient progress toward functional and medical goals  Care Tool:  Bathing    Body parts bathed by patient: Right arm, Left arm, Chest, Abdomen, Face, Right upper leg, Left upper leg         Bathing assist Assist Level: Moderate Assistance - Patient 50 - 74%     Upper Body Dressing/Undressing Upper body dressing   What is the patient wearing?: Button up shirt    Upper body assist Assist Level: Set up assist    Lower Body Dressing/Undressing Lower body dressing      What is the patient wearing?: Pants     Lower body assist Assist for lower body dressing: Maximal Assistance - Patient 25 - 49%     Toileting Toileting Toileting Activity did not occur (Clothing management and hygiene only): Refused  Toileting assist Assist for toileting: Contact Guard/Touching assist Assistive Device Comment: platform front wheel walker   Transfers Chair/bed transfer  Transfers assist     Chair/bed transfer assist level: Contact Guard/Touching assist Chair/bed  transfer assistive device: Walker, Archivist   Ambulation assist      Assist level: Contact Guard/Touching assist Assistive device: Walker-platform Max distance: 76ft   Walk 10 feet activity   Assist  Walk 10 feet activity did not occur: Safety/medical concerns (unable due to endurance/pain)  Assist level: Contact Guard/Touching assist Assistive device: Walker-rolling   Walk 50 feet activity   Assist Walk 50 feet with 2 turns activity did not occur: Safety/medical concerns  Assist level:  (unable due to endurance/pain)      Walk 150 feet activity   Assist Walk 150 feet activity did not occur: Safety/medical concerns          Walk 10 feet on uneven surface  activity   Assist Walk 10 feet on uneven surfaces activity did not occur: Safety/medical concerns         Wheelchair     Assist Is the patient using a wheelchair?: Yes Type of Wheelchair: Manual      Max wheelchair distance: 28'    Wheelchair 50 feet with 2 turns activity    Assist        Assist Level: Contact Guard/Touching assist   Wheelchair 150 feet activity     Assist      Assist Level: Maximal Assistance - Patient 25 - 49%   Blood pressure 133/70, pulse 71, temperature 98.6 F (37 C), temperature source Oral, resp. rate 16, height 5\' 2"  (1.575 m), weight 70.6 kg, SpO2 96 %.  Medical Problem List and Plan: 1. Functional deficits secondary to critical polytrauma; left wrist fracture/dislocation s/p ORIF, left femoral condyle fracture- NWB LUE distally and TDWB on LLE             -patient may  shower if they wrap LUE             -ELOS/Goals: 8/21 mod I to supervision, min a stairs for home entry  -Continue [name] therapies including PT, OT   2.  Antithrombotics: -DVT/anticoagulation:  Pharmaceutical: Lovenox             -antiplatelet therapy: n/a 3. Pain Management:   robaxin,  and Ultram prn                -dc'ed regular Tylenol due to elevated LFTs             - con't tramadol and  Norco 5/325 mg for therapy to help control pain-  -lidocaine patches  -8/14 Pain fairly well controlled, continue current medications 4. Mood/Behavior/Sleep: team providing emotional support             -antipsychotic agents: n/a 5. Neuropsych/cognition: This patient is capable of making decisions on her own behalf. 6. Skin/Wound Care: local care 7. Fluids/Electrolytes/Nutrition: discussed nutrition. Boost shakes are fine. added protein supplement as well.  -added calcium and D for bones 8: Left wrist fracture/dislocation s/p ORIF 8/1; in splint             -platform weight-bear, no WB through wrist  -re-wrapped splint 8/7  -  Dr.  10/7 saw pt yesterday. 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 High blood pressure- norvasc added  8/7   Vitals:   10/18/21 1918 10/19/21 0239  BP: (!) 149/82 133/70  Pulse: 70 71  Resp: 18 16  Temp: 98.3 F (36.8 C) 98.6 F (37 C)  SpO2: 98% 96%     -norvasc at 10mg  daily currently. Better control. Pain component a times 10. Constipation:  -add senna-s bid, stop colace  -add shceduled miralax again  LOS: 8 days A FACE TO FACE EVALUATION WAS PERFORMED  10/19/2021, 9:27 AM

## 2021-10-20 DIAGNOSIS — T07XXXA Unspecified multiple injuries, initial encounter: Secondary | ICD-10-CM | POA: Diagnosis not present

## 2021-10-20 DIAGNOSIS — E876 Hypokalemia: Secondary | ICD-10-CM | POA: Diagnosis not present

## 2021-10-20 DIAGNOSIS — S62102S Fracture of unspecified carpal bone, left wrist, sequela: Secondary | ICD-10-CM | POA: Diagnosis not present

## 2021-10-20 DIAGNOSIS — I10 Essential (primary) hypertension: Secondary | ICD-10-CM | POA: Diagnosis not present

## 2021-10-20 NOTE — Progress Notes (Signed)
Physical Therapy Session Note  Patient Details  Name: Katherine Atkins MRN: [id number] Date of Birth: Sep 27, 1940  Today's Date: 10/20/2021 PT Individual Time: 0915-1000 and  1425-1535 PT Individual Time Calculation (min): 45 min and 70 min   Short Term Goals: Week 2:  PT Short Term Goal 1 (Week 2): STG=LTG due to ELOS.  Skilled Therapeutic Interventions/Progress Updates:     Session 1: Patient in recliner in the room upon PT arrival. Patient alert and agreeable to PT session. Patient reported 4/10 L pelvic pain during session, RN made aware. PT provided repositioning, rest breaks, and distraction as pain interventions throughout session.   Therapeutic Activity: Bed Mobility: Patient performed supine to/from sit with CGA-min A with use of gait belt as leg lifter. Provided verbal cues for sequencing, use of leg lifter, and use of R elbow to manage trunk control. Transfers: Patient performed sit to/from stand x3 with supervision using L PFRW. Provided verbal cues for safe use of AD x1.  Gait Training:  Patient ambulated 15 feet x2 and 5 feet x1 feet using L PFRW with CGA progressing to supervision. Ambulated with hop-to gait pattern on R with poor foot clearance and control at initial contact. Provided verbal cues for shoulder depression land use of scapular muscle activation to promote improved upper extremity use for R foot clearance. Patient maintained NWB on L lower extremity throughout.   Wheelchair Mobility:  Patient propelled wheelchair >100 feet with supervision using R hemi-technique. Provided verbal cues for turning technique, required total A for leg rest management.  Patient in recliner in the room at end of session with breaks locked, chair alarm set, and all needs within reach.   Session 2: Patient in recliner in the room upon PT arrival. Patient alert and agreeable to PT session. Patient reported 3-5/10 L pelvic pain during session, RN made aware. PT provided repositioning, rest  breaks, and distraction as pain interventions throughout session.   Therapeutic Activity: Bed Mobility: Patient performed supine to/from sit with supervision-CGA using gait belt as a leg lifter. Patient able to teach back method from previous session. Transfers: Patient performed sit to/from stand x4 with supervision for safety using L PFRW. Patient performed toileting independ Patient performed a simulated sedan height car transfer with supervision using L PFRW. Provided demonstration for cues for safe technique, instructed to move seat back, recline, seat back, and scoot.  Gait Training:  Patient ambulated 15 feet, 25 feet x2, and 18 feet using L PFRW with supervision. Ambulated with hop-to gait pattern on R with improved foot clearance and control at initial contact. Provided verbal cues for pacing and controlled breathing for improved endurance with longer distances. Patient maintained NWB on L lower extremity throughout.   Educated on placing chairs in each room as a place for a sitting rest break at home for energy conservation and safety with home ambulation. Patient in agreement.   Therapeutic Exercise: Patient performed the following exercises with verbal and tactile cues for proper technique. -L heel slides 2x10 -L quad sets 2x10 with 5 sec hold -L hip abduction AAROM 2x10 -L isometric hip adduction 2x5 with 5 sec hold  Patient in recliner in the room at end of session with breaks locked, chair alarm set, and all needs within reach.   Therapy Documentation Precautions:  Precautions Precautions: Fall Precaution Comments: rib fx, pelvic fx Required Braces or Orthoses: Splint/Cast Splint/Cast: LUE Restrictions Weight Bearing Restrictions: Yes LUE Weight Bearing: Non weight bearing LLE Weight Bearing: Touchdown weight bearing Other Position/Activity  Restrictions: NWB L wrist    Therapy/Group: Individual Therapy  Trinidy Masterson L Duval Macleod PT, DPT, NCS, CBIS  10/20/2021, 3:38 PM

## 2021-10-20 NOTE — Patient Care Conference (Signed)
Inpatient RehabilitationTeam Conference and Plan of Care Update Date: 10/20/2021   Time: 10:12 AM   Patient Name: Katherine Atkins      Medical Record Number: [id number]  Date of Birth: 03-Sep-1940 Sex: Female         Room/Bed: 4W02C/4W02C-01 Payor Info: Payor: [employer] MEDICARE / Plan: [facility] MEDICARE / Product Type: *No Product type* /    Admit Date/Time:  10/11/2021 11:30 AM  Primary Diagnosis:  Critical polytrauma  Hospital Problems: Principal Problem:   Critical polytrauma    Expected Discharge Date: Expected Discharge Date: 10/26/21  Team Members Present: Physician leading conference: Dr. Faith Rogue Social Worker Present: Cecile Sheerer, LCSWA Nurse Present: Other (comment) Vedia Pereyra, RN) PT Present: Serina Cowper, PT OT Present: Blanch Media, OT PPS Coordinator present : Fae Pippin, SLP     Current Status/Progress Goal Weekly Team Focus  Bowel/Bladder   cont of B/B. LAst BM-8/13  Mainatain continence  Assisst with bathroom priveleges   Swallow/Nutrition/ Hydration             ADL's   CGA for ADLs and transfers, improving adherence to WB precautions with adaptive strategies  S  activity tolerance, strengthening, ADL retraining, balnace   Mobility   min A bed mobility, supervision-CGA transfers and gait, limited to 15 ft due to pain and generalized deconditioning, w/c mobility >50 ft, no progress with stairs opting to recruit assist to be bumped up steps in a w/c  mod I txs, short distnace household gait, and w/c mobility; min assist stairs for home entry  activity tolerance, strengthening/ROM, functional mobility, gait training, w/c mobility, patient/caregiver education   Communication             Safety/Cognition/ Behavioral Observations            Pain   c/o of generalized pain from poly-traume. pain managemet in progress- effective  pain<3  assess pain q shift and PRN   Skin   Scattetered ecchymosis from poly-trauma, wound to R  breast- healing /scabing  maintain skin intact  assess skin q shift and PRN     Discharge Planning:  Pt will d/c to home with housemate Pat. Dennie Bible is not able to provide any physical assistance. She will need to be Mod I at time of discharge.   Team Discussion: Critical polytrauma. Continent x 2 LBM 08/13 after MOM given. Pain improving. Incision to LUE has sutures and healing well. Therapies going well and overall supervision to contact guard. Patient refuses to install ramp as recommended.  Patient reports neighbors to assist with steps and going to bump up and down when not available. Patient reports that will use wheelchair out in community and ambulate in the home.  Patient on target to meet rehab goals: yes, patient near goal level and ambulated 38ft this morning  *See Care Plan and progress notes for long and short-term goals.   Revisions to Treatment Plan:  None at this time  Teaching Needs: Medications, safety, weightbearing restrictions, gait/transfer training, etc  Current Barriers to Discharge: Home enviroment access/layout and Weight bearing restrictions  Possible Resolutions to Barriers: Medication education, family education, order recommended DME     Medical Summary Current Status: pain control improving. still in LUE splint. moving bowels. bp fluctuates  Barriers to Discharge: Medical stability   Possible Resolutions to [employer] Focus: daily assessment of labs and pt data, pain control, ortho f/u   Continued Need for Acute Rehabilitation Level of Care: The patient requires daily medical management by a  physician with specialized training in physical medicine and rehabilitation for the following reasons: Direction of a multidisciplinary physical rehabilitation program to maximize functional independence : Yes Medical management of patient stability for increased activity during participation in an intensive rehabilitation regime.: Yes Analysis of laboratory  values and/or radiology reports with any subsequent need for medication adjustment and/or medical intervention. : Yes   I attest that I was present, lead the team conference, and concur with the assessment and plan of the team.   Jearld Adjutant 10/20/2021, 1:13 PM

## 2021-10-20 NOTE — Progress Notes (Signed)
Occupational Therapy Session Note  Patient Details  Name: Katherine Atkins MRN: [id number] Date of Birth: 04/05/1940  Today's Date: 10/20/2021 OT Individual Time: 1135-1205 OT Individual Time Calculation (min): 30 min   Today's Date: 10/20/2021 OT Individual Time: 1300-1350 OT Individual Time Calculation (min): 50 min  and Today's Date: 10/20/2021 OT Missed Time: 10 Minutes Missed Time Reason: Pain  Short Term Goals: Week 1:  OT Short Term Goal 1 (Week 1): Pt will complete 1/3 toileting steps with no more than min A for balance using LRAD OT Short Term Goal 2 (Week 1): Pt will donn LB clothing with min A using AE PRN OT Short Term Goal 3 (Week 1): Pt will complete ambulatory toilet transfer with min A to promote OOB toileting  Skilled Therapeutic Interventions/Progress Updates:    Session 1: Pt received in recliner with no pain reported. Premedicated. ADL: Pt completes toileting with supervision hoppong with PFRW to/from bathroom with supervision overall and manages pants in standing with supervision resting L forearm on PF to fasten  Therapeutic activity Pt completes kitchen search into various height cabinets/appliaces in prep for IADL retraining from atulatory level with PFRW mod cuing for safety/positioning throughout environment and sliding bowl across counter to gather items.  Pt educated on use of RW tray, however "would rather use a drawstring bag". Educated that she should not wear drawstring bag as it could throw of center of gravoty during transitional movements and putting on taking it off   Pt left at end of session in recliner with exit alarm on, call light in reach and all needs met  Session 2:  Pt received in recliner with initially no reported pain, however throughout session pain increasing till not tolerable. Requested medication but did not receive till end of session. Terminated session 10 min early d/t pain. Offered fresh ice but pt declined.   Rest and repositioning  provided for pain relief  ADL: Pt completes shower stall transfer with TTB with pt teaching back technique to OT and supervision for functional mobility with PFRW. Pt and OT discuss shower curtain adaptations to keep floor dry, LH shower head and grip strips in/out of shower stall. Pt and OT talk through steps of bed mobility.   Therapeutic activity Energy conservation discussed with use of written handout provided. Pt and OT discuss 4Ps of energy conservation and OT discussed ADL/IADL adaptations.   Pt left at end of session in recliner with exit alarm on, call light in reach and all needs met    Therapy Documentation Precautions:  Precautions Precautions: Fall Precaution Comments: rib fx, pelvic fx Required Braces or Orthoses: Splint/Cast Splint/Cast: LUE Restrictions Weight Bearing Restrictions: Yes LUE Weight Bearing: Non weight bearing LLE Weight Bearing: Touchdown weight bearing Other Position/Activity Restrictions: NWB L wrist  Therapy/Group: Individual Therapy  Stephanie M Schlosser 10/20/2021, 7:01 AM 

## 2021-10-20 NOTE — Progress Notes (Signed)
PROGRESS NOTE   Subjective/Complaints:  Happy that she moved bowels. Pain under reasonable control. Was able to sleep  ROS: Patient denies fever, rash, sore throat, blurred vision, dizziness, nausea, vomiting, diarrhea, cough, shortness of breath or chest pain, joint or back/neck pain, headache, or mood change.   Objective:   No results found.  Recent Labs    10/19/21 0521  WBC 7.0  HGB 10.2*  HCT 30.9*  PLT 390   Recent Labs    10/19/21 0521  NA 138  K 4.0  CL 106  CO2 27  GLUCOSE 109*  BUN 18  CREATININE 0.61  CALCIUM 8.4*    Intake/Output Summary (Last 24 hours) at 10/20/2021 1122 Last data filed at 10/20/2021 0839 Gross per 24 hour  Intake 358 ml  Output 1300 ml  Net -942 ml        Physical Exam: Vital Signs Blood pressure 134/74, pulse 88, temperature 99.2 F (37.3 C), resp. rate 16, height 5\' 2"  (1.575 m), weight 70.6 kg, SpO2 97 %.   Constitutional: No distress . Vital signs reviewed. HEENT: NCAT, EOMI, oral membranes moist Neck: supple Cardiovascular: RRR without murmur. No JVD    Respiratory/Chest: CTA Bilaterally without wheezes or rales. Normal effort    GI/Abdomen: BS +, non-tender, non-distended Ext: no clubbing, cyanosis, or edema Psych: pleasant and cooperative  Skin: No evidence of breakdown, no evidence of rash. Wounds cdi Neuro:  Alert and oriented x 3. Normal insight and awareness. Intact Memory. Normal language and speech. Cranial nerve exam unremarkable. Motor limited by ortho/pain left arm and leg. Sensory exam normal for light touch and pain in all 4 limbs. No limb ataxia or cerebellar signs. No abnormal tone appreciated.   Musculoskeletal: Left arm in splint which is fitting. Bruising and swelling still at left knee and thighs but improved. Right torso tender to palpation   Assessment/Plan: 1. Functional deficits which require 3+ hours per day of interdisciplinary therapy in  a comprehensive inpatient rehab setting. Physiatrist is providing close team supervision and 24 hour management of active medical problems listed below. Physiatrist and rehab team continue to assess barriers to discharge/monitor patient progress toward functional and medical goals  Care Tool:  Bathing    Body parts bathed by patient: Right arm, Left arm, Chest, Abdomen, Face, Right upper leg, Left upper leg         Bathing assist Assist Level: Moderate Assistance - Patient 50 - 74%     Upper Body Dressing/Undressing Upper body dressing   What is the patient wearing?: Button up shirt    Upper body assist Assist Level: Set up assist    Lower Body Dressing/Undressing Lower body dressing      What is the patient wearing?: Pants     Lower body assist Assist for lower body dressing: Maximal Assistance - Patient 25 - 49%     Toileting Toileting Toileting Activity did not occur (Clothing management and hygiene only): Refused  Toileting assist Assist for toileting: Contact Guard/Touching assist Assistive Device Comment: platform front wheel walker   Transfers Chair/bed transfer  Transfers assist     Chair/bed transfer assist level: Contact Guard/Touching assist Chair/bed transfer assistive device:  Walker, Adult nurse assist      Assist level: Contact Guard/Touching assist Assistive device: Walker-platform Max distance: 25   Walk 10 feet activity   Assist  Walk 10 feet activity did not occur: Safety/medical concerns (unable due to endurance/pain)  Assist level: Contact Guard/Touching assist Assistive device: Walker-rolling   Walk 50 feet activity   Assist Walk 50 feet with 2 turns activity did not occur: Safety/medical concerns  Assist level:  (unable due to endurance/pain)      Walk 150 feet activity   Assist Walk 150 feet activity did not occur: Safety/medical concerns         Walk 10 feet on uneven  surface  activity   Assist Walk 10 feet on uneven surfaces activity did not occur: Safety/medical concerns         Wheelchair     Assist Is the patient using a wheelchair?: Yes Type of Wheelchair: Manual      Max wheelchair distance: 53'    Wheelchair 50 feet with 2 turns activity    Assist        Assist Level: Contact Guard/Touching assist   Wheelchair 150 feet activity     Assist      Assist Level: Maximal Assistance - Patient 25 - 49%   Blood pressure 134/74, pulse 88, temperature 99.2 F (37.3 C), resp. rate 16, height 5\' 2"  (1.575 m), weight 70.6 kg, SpO2 97 %.  Medical Problem List and Plan: 1. Functional deficits secondary to critical polytrauma; left wrist fracture/dislocation s/p ORIF, left femoral condyle fracture- NWB LUE distally and TDWB on LLE             -patient may  shower if they wrap LUE             -ELOS/Goals: 8/21 mod I to supervision, min a stairs for home entry  -Continue [name] therapies including PT and OT. Interdisciplinary team conference today to discuss goals, barriers to discharge, and dc planning.    2.  Antithrombotics: -DVT/anticoagulation:  Pharmaceutical: Lovenox             -antiplatelet therapy: n/a 3. Pain Management:   robaxin,  and Ultram prn                -dc'ed regular Tylenol due to elevated LFTs             - con't tramadol and  Norco 5/325 mg for therapy to help control pain-  -lidocaine patches  -8/15 Pain fairly well controlled, continue current medications   -recommended taking norco around 0700 to coincide with am activities 4. Mood/Behavior/Sleep: team providing emotional support             -antipsychotic agents: n/a 5. Neuropsych/cognition: This patient is capable of making decisions on her own behalf. 6. Skin/Wound Care: local care 7. Fluids/Electrolytes/Nutrition: discussed nutrition. Boost shakes are fine. added protein supplement as well.  -added calcium and D for bones 8: Left wrist  fracture/dislocation s/p ORIF 8/1; in splint             -platform weight-bear, no WB through wrist  -re-wrapped splint 8/7  -  Dr. 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 High blood pressure- norvasc added 8/7   Vitals:   10/19/21 1931 10/20/21 0322  BP: 136/89 134/74  Pulse: 67 88  Resp: 17 16  Temp: 97.8 F (36.6 C) 99.2 F (37.3 C)  SpO2: 100% 97%     -norvasc at 10mg  daily currently.    -bp controlled 8/15 10. Constipation:  -added senna-s bid, stopped colace  -resumed shceduled miralax   LOS: 9 days A FACE TO FACE EVALUATION WAS PERFORMED  9/15 10/20/2021, 11:22 AM

## 2021-10-20 NOTE — Progress Notes (Signed)
Patient ID: Katherine Atkins, female   DOB: 26-Nov-1940, 81 y.o.   MRN: [id number]  SW met with pt in room to provide updates from team conference, and d/c date remains 8/21. SW discussed DME recommendations: TTB, w/c, RW with left platform attachment, and outpatient therapies. Pt amenable to outpatient PT/OT at [facility]. Family edu scheduled for Thursday (8/17) 1pm-4pm with her house mate [name]. SW to provide pt with sitter list as she is working on additional supports in the home and to help getting her in/out of the home for outpatient therapy appointments. She states she does have a neighbor that will get her into her home on date of discharge.   SW ordered DME with [employer] via parachute.   Loralee Pacas, MSW, [place] Office: [phone] Cell: [phone] Fax: [phone]

## 2021-10-21 DIAGNOSIS — I10 Essential (primary) hypertension: Secondary | ICD-10-CM | POA: Diagnosis not present

## 2021-10-21 DIAGNOSIS — T07XXXA Unspecified multiple injuries, initial encounter: Secondary | ICD-10-CM | POA: Diagnosis not present

## 2021-10-21 DIAGNOSIS — E876 Hypokalemia: Secondary | ICD-10-CM | POA: Diagnosis not present

## 2021-10-21 DIAGNOSIS — S62102S Fracture of unspecified carpal bone, left wrist, sequela: Secondary | ICD-10-CM | POA: Diagnosis not present

## 2021-10-21 NOTE — Discharge Instructions (Addendum)
Inpatient Rehab Discharge Instructions  Alawna Graybeal Discharge date and time:  10/26/2021  Activities/Precautions/ Functional Status: Activity: no lifting, driving, or strenuous exercise until cleared by MD Diet: regular diet Wound Care: keep wound clean and dry Functional status:  ___ No restrictions     ___ Walk up steps independently ___ 24/7 supervision/assistance   ___ Walk up steps with assistance __x_ Intermittent supervision/assistance  ___ Bathe/dress independently ___ Walk with walker     _x__ Bathe/dress with assistance ___ Walk Independently    ___ Shower independently ___ Walk with assistance    __x_ Shower with assistance _x__ No alcohol     ___ Return to work/school ________   COMMUNITY REFERRALS UPON DISCHARGE:    Outpatient: PT     OT                 Agency: [facility]  Phone: [phone]             Appointment Date/Time: *Please expect follow-up within 7-10 business days. If you have not received follow-up, be sure to contact the site directly.*    Medical Equipment/Items Ordered: rolling walker with left platform attachment, wheelchair                                                 Agency/Supplier: [employer] [facility] [phone]   Special Instructions: No driving, alcohol consumption or tobacco use.   Discuss referral for annual aorta ultrasound and referral to gynecology for findings on CT of abdomen with your primary care provider.   My questions have been answered and I understand these instructions. I will adhere to these goals and the provided educational materials after my discharge from the hospital.  Patient/Caregiver Signature _______________________________ Date __________  Clinician Signature _______________________________________ Date __________  Please bring this form and your medication list with you to all your follow-up doctor's appointments.

## 2021-10-21 NOTE — Progress Notes (Signed)
PROGRESS NOTE   Subjective/Complaints:  Had pain in her hip last night which kept her awake. No new issues otherwise.   ROS: Patient denies fever, rash, sore throat, blurred vision, dizziness, nausea, vomiting, diarrhea, cough, shortness of breath or chest pain,  headache, or mood change.   Objective:   No results found.  Recent Labs    10/19/21 0521  WBC 7.0  HGB 10.2*  HCT 30.9*  PLT 390   Recent Labs    10/19/21 0521  NA 138  K 4.0  CL 106  CO2 27  GLUCOSE 109*  BUN 18  CREATININE 0.61  CALCIUM 8.4*    Intake/Output Summary (Last 24 hours) at 10/21/2021 0820 Last data filed at 10/21/2021 0102 Gross per 24 hour  Intake 600 ml  Output 250 ml  Net 350 ml        Physical Exam: Vital Signs Blood pressure (!) 140/75, pulse 78, temperature 98.2 F (36.8 C), resp. rate 18, height 5\' 2"  (1.575 m), weight 70.6 kg, SpO2 96 %.   General: No acute distress HEENT: NCAT, EOMI, oral membranes moist Cards: reg rate  Chest: normal effort Abdomen: Soft, NT, ND Skin: dry, intact Extremities: no edema Psych: pleasant and appropriate  Skin: No evidence of breakdown, no evidence of rash. Wounds cdi Neuro:  Alert and oriented x 3. Normal insight and awareness. Intact Memory. Normal language and speech. Cranial nerve exam unremarkable. Motor limited by ortho/pain left arm and leg. Sensory exam normal for light touch and pain in all 4 limbs. No limb ataxia or cerebellar signs. No abnormal tone appreciated.   Musculoskeletal: Left arm in splint which is fitting. Bruising and swelling still at left knee and thighs but improved. Right torso tender to palpation   Assessment/Plan: 1. Functional deficits which require 3+ hours per day of interdisciplinary therapy in a comprehensive inpatient rehab setting. Physiatrist is providing close team supervision and 24 hour management of active medical problems listed  below. Physiatrist and rehab team continue to assess barriers to discharge/monitor patient progress toward functional and medical goals  Care Tool:  Bathing    Body parts bathed by patient: Right arm, Left arm, Chest, Abdomen, Face, Right upper leg, Left upper leg         Bathing assist Assist Level: Moderate Assistance - Patient 50 - 74%     Upper Body Dressing/Undressing Upper body dressing   What is the patient wearing?: Button up shirt    Upper body assist Assist Level: Set up assist    Lower Body Dressing/Undressing Lower body dressing      What is the patient wearing?: Pants     Lower body assist Assist for lower body dressing: Maximal Assistance - Patient 25 - 49%     Toileting Toileting Toileting Activity did not occur (Clothing management and hygiene only): Refused  Toileting assist Assist for toileting: Contact Guard/Touching assist Assistive Device Comment: platform front wheel walker   Transfers Chair/bed transfer  Transfers assist     Chair/bed transfer assist level: Supervision/Verbal cueing Chair/bed transfer assistive device:   Ambulation assist      Assist level: Supervision/Verbal cueing Assistive device:  Walker-platform Max distance: 25   Walk 10 feet activity   Assist  Walk 10 feet activity did not occur: Safety/medical concerns (unable due to endurance/pain)  Assist level: Supervision/Verbal cueing Assistive device: Walker-platform   Walk 50 feet activity   Assist Walk 50 feet with 2 turns activity did not occur: Safety/medical concerns  Assist level:  (unable due to endurance/pain)      Walk 150 feet activity   Assist Walk 150 feet activity did not occur: Safety/medical concerns         Walk 10 feet on uneven surface  activity   Assist Walk 10 feet on uneven surfaces activity did not occur: Safety/medical concerns         Wheelchair     Assist Is the patient using a  wheelchair?: Yes Type of Wheelchair: Manual    Wheelchair assist level: Supervision/Verbal cueing Max wheelchair distance: 100 ft    Wheelchair 50 feet with 2 turns activity    Assist        Assist Level: Supervision/Verbal cueing   Wheelchair 150 feet activity     Assist      Assist Level: Maximal Assistance - Patient 25 - 49%   Blood pressure (!) 140/75, pulse 78, temperature 98.2 F (36.8 C), resp. rate 18, height 5\' 2"  (1.575 m), weight 70.6 kg, SpO2 96 %.  Medical Problem List and Plan: 1. Functional deficits secondary to critical polytrauma; left wrist fracture/dislocation s/p ORIF, left femoral condyle fracture- NWB LUE distally and TDWB on LLE             -patient may  shower if they wrap LUE             -ELOS/Goals: 8/21 mod I to supervision, min a stairs for home entry  -Continue [name] therapies including PT, OT   2.  Antithrombotics: -DVT/anticoagulation:  Pharmaceutical: Lovenox             -antiplatelet therapy: n/a 3. Pain Management:   robaxin,  and Ultram prn                -dc'ed regular Tylenol due to elevated LFTs             - con't tramadol and  Norco 5/325 mg for therapy to help control pain-  -lidocaine patches  -8/16 Pain fairly well controlled, suspect she had some spasms last night   -she's trying to take norco around 0700 to coincide with am activities 4. Mood/Behavior/Sleep: team providing emotional support             -antipsychotic agents: n/a 5. Neuropsych/cognition: This patient is capable of making decisions on her own behalf. 6. Skin/Wound Care: local care 7. Fluids/Electrolytes/Nutrition: discussed nutrition. Boost shakes are fine. added protein supplement as well.  -added calcium and D for bones 8: Left wrist fracture/dislocation s/p ORIF 8/1; in splint             -platform weight-bear, no WB through wrist  -re-wrapped splint 8/7  -  Dr. 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 High blood pressure- norvasc added 8/7  Vitals:   10/20/21 1957 10/21/21 0223  BP: 135/78 (!) 140/75  Pulse: 77 78  Resp: 17 18  Temp: 98.2 F (36.8 C) 98.2 F (36.8 C)  SpO2: 98% 96%     -norvasc at 10mg  daily currently.    -bp controlled 8/16 10. Constipation:  -added senna-s bid, stopped colace  -resumed shceduled miralax   LOS: 10 days A FACE TO FACE EVALUATION WAS PERFORMED  10/21/2021, 8:20 AM

## 2021-10-21 NOTE — Progress Notes (Addendum)
Occupational Therapy Weekly Progress Note  Patient Details  Name: Katherine Atkins MRN: [id number] Date of Birth: 02/29/1941  Beginning of progress report period: October 12, 2021 End of progress report period: October 21, 2021  Today's Date: 10/21/2021 OT Individual Time: 0815-0930 OT Individual Time Calculation (min): 75 min    Patient has met 3 of 3 short term goals.  Pt has made excellent progress this reporting period improving to supervision and occasional CGA. Pt still requires frequent cuing during transitional movements to maintain LUE precautions. Pt is improving functional mobility at short distance household level and we have initiated simple IADL tasks. Pt housemate and friend to be present for family education tomorrow  Patient continues to demonstrate the following deficits: muscle weakness, decreased cardiorespiratoy endurance, and decreased standing balance, decreased postural control, decreased balance strategies, and difficulty maintaining precautions and therefore will continue to benefit from skilled OT intervention to enhance overall performance with BADL, iADL, and Reduce care partner burden.  Patient progressing towards LTG, continue POC OT Short Term Goals Week 1:  OT Short Term Goal 1 (Week 1): Pt will complete 1/3 toileting steps with no more than min A for balance using LRAD OT Short Term Goal 1 - Progress (Week 1): Met OT Short Term Goal 2 (Week 1): Pt will donn LB clothing with min A using AE PRN OT Short Term Goal 2 - Progress (Week 1): Met OT Short Term Goal 3 (Week 1): Pt will complete ambulatory toilet transfer with min A to promote OOB toileting OT Short Term Goal 3 - Progress (Week 1): Met Week 2:  OT Short Term Goal 1 (Week 2): STG=LTG d.t ELOS  Skilled Therapeutic Interventions/Progress Updates:     Pt received in bed with 4 out of 10 pain in L hip. Repositioning provided for pain relief  ADL: Pt completes ADL at overall supervision Level. Skilled  interventions include: elevating foot on BSC bucket when toileting/bathing for repositioning for pain relief, successful trial of using elbows and R hand to push up into long sitting for bed mobility, cuing for WB precautions for L wrist/lateral leans to wash buttocks. Pt able to don shirt/groom at sink with setup. Pt is supervision for LB dressing from w/c except for footwear which pt is required to get into recliner for improved positioning for donning R shoe.  TA: Massed practice bed mobility with sup<>sitting EOB with improved independence with tucking chin,swinging 1LE off EOB and pressing up onto B elbows then R forearm with S 3x  Pt left at end of session in bed with exit alarm on, call light in reach and all needs met   Therapy Documentation Precautions:  Precautions Precautions: Fall Precaution Comments: rib fx, pelvic fx Required Braces or Orthoses: Splint/Cast Splint/Cast: LUE Restrictions Weight Bearing Restrictions: Yes LUE Weight Bearing: Non weight bearing LLE Weight Bearing: Touchdown weight bearing Other Position/Activity Restrictions: NWB L wrist   Therapy/Group: Individual Therapy  Stephanie M Schlosser 10/21/2021, 6:53 AM  

## 2021-10-21 NOTE — Progress Notes (Signed)
Physical Therapy Session Note  Patient Details  Name: Katherine Atkins MRN: [id number] Date of Birth: 1940-10-09  Today's Date: 10/21/2021 PT Individual Time: 1000-1055 and 1300-1415 PT Individual Time Calculation (min): 55 min and 75 min   Short Term Goals: Week 2:  PT Short Term Goal 1 (Week 2): STG=LTG due to ELOS.  Skilled Therapeutic Interventions/Progress Updates:     Session 1: Patient in recliner in the room upon PT arrival. Patient alert and agreeable to PT session. Patient reported 3-4/10 L pelvic pain during session, RN made aware. PT provided repositioning, rest breaks, and distraction as pain interventions throughout session.   Confirmed family education for tomorrow with patient's house-mate, Katherine Atkins, per CSW report. Patient reports that she needs to ensure that Katherine Atkins is aware of the time. Patient wrote a note to call her after PT session. Patient with questions regarding driving services. Reports that she will need them to bump her up/down the steps to go in and out of the house. PT informed her that this was not typically provided by these services and instructed her to continue setting up assistance for this for her appointments. Patient stated that if she set up someone to assist with this, Katherine Atkins, would be able to drive her to appointments. CSW made aware to confirm extent of services provided by driving services.   Patient reports increased time spent on bed mobility technique today with OT. Able to teach back technique. Instructed patient to perform this technique every time in/out of bed with all staff to practice for d/c home. Patient stated understanding.   Therapeutic Activity: Transfers: Patient performed sit to/from stand x3 with mod I from hospital recliner and w/c using L PFRW.   Gait Training:  Patient ambulated 25 feet and 15 feet with 2 90 deg turns using L PFRW with distant supervision to simulate home ambulation. Ambulated with hop-to gait pattern on R and toe catching x1  with patient able to self correct. Provided verbal cues for increased upper extremity use for increased R foot clearance and pacing for management of activity tolerance.  Timed Up and Go (TUG): 1:17 using L PFRW with supervision  Educated patient on energy conservation techniques, use of a bag on her RW or light back pack to carry items from one room to the next, use of a water bottle that can be carried in her bag vs a open glass for drinks, and placement of chairs in each room, ~25 feet distances, to allow for seated rest breaks in the home as needed. Patient receptive to all education and stated understanding.   Patient in recliner in the room at end of session with breaks locked and all needs within reach.   Session 2: Patient in recliner in the room upon PT arrival. Patient alert and agreeable to PT session. Patient reported 7/10 L pelvic pain during session, RN made aware and provided pain medicine during session. PT provided repositioning, rest breaks, and distraction as pain interventions throughout session.   Patient with questions regarding pain medication and med management. Reviewed medications (Vicodin, Tramadol, and Robaxin), their purpose and scheduled dose and time with patient, also reviewed which were PRN and which were scheduled. Patient wrote this down for improved understanding and management of pain control both during her stay and for upcoming d/c. Educated her on reviewing this with the PA during d/c education prior to d/c to understand any changes made by the medical team. Patient stated understanding.   Spent remainder of session problem solving  and discussing home entry. Patient drew out home entry set-up, showing 2x6" steps to a platform and a 2-3" threshold to enter. Educated patient on technique that patient would have to achieve to hop up the steps using the L PFRW. Patient asked about bumping up steps on her bottom and PT educated on barriers for patient to get up from  the steps at the top due to weight bearing precautions and reduced strength. Showed patient videos of bumping up the steps in the w/c with 1 person assist. Educated on increased weight of w/c due to ELRs and limited patient assist due to only being able to push the wheels with one hand due to L UE NWB through wrist. Placed patient in wheelie position x2 to demonstrate the sensation for positioning to bump up the steps. Educated on the force needed to bump patient up and lower the patient down the steps in the w/c and the benefit of 2 person assist. Patient reports that her neighbor, Katherine Atkins, will not be able to do this, as planned originally, due to the required physical demand. Educated on the need to be able to leave her home for therapy and medical appointments and in the event of an emergency. Patient was previously opposed to using a ramp, however, PT provided patient with a pamphlet with images of temporary ramps and educated on the benefits of a ramp for her current situation. Patient in agreement that a ramp would be the safest and most efficacious option at this time and agreed to call the vender to initiate discussions for ramp placement prior to d/c, CSW made aware.   Therapeutic Activity: Transfers: Patient performed sit to/from stand x2 with mod I using L PFRW.  Gait Training:  Patient ambulated 30 feet and 15 feet using L PFRW with distant supervision, as above.   Patient in recliner in the room at end of session with breaks locked, chair alarm set, and all needs within reach.   Therapy Documentation Precautions:  Precautions Precautions: Fall Precaution Comments: rib fx, pelvic fx Required Braces or Orthoses: Splint/Cast Splint/Cast: LUE Restrictions Weight Bearing Restrictions: Yes LUE Weight Bearing: Non weight bearing LLE Weight Bearing: Touchdown weight bearing Other Position/Activity Restrictions: NWB L wrist    Therapy/Group: Individual Therapy  Vilas Edgerly L Eron Staat PT,  DPT, NCS, CBIS  10/21/2021, 4:10 PM

## 2021-10-22 ENCOUNTER — Encounter (INDEPENDENT_AMBULATORY_CARE_PROVIDER_SITE_OTHER): Payer: Medicare Other | Admitting: Ophthalmology

## 2021-10-22 DIAGNOSIS — T07XXXA Unspecified multiple injuries, initial encounter: Secondary | ICD-10-CM | POA: Diagnosis not present

## 2021-10-22 DIAGNOSIS — E876 Hypokalemia: Secondary | ICD-10-CM | POA: Diagnosis not present

## 2021-10-22 DIAGNOSIS — I10 Essential (primary) hypertension: Secondary | ICD-10-CM | POA: Diagnosis not present

## 2021-10-22 DIAGNOSIS — S62102S Fracture of unspecified carpal bone, left wrist, sequela: Secondary | ICD-10-CM | POA: Diagnosis not present

## 2021-10-22 NOTE — Progress Notes (Signed)
PROGRESS NOTE   Subjective/Complaints:  In good spirits. Pain present but feeling better about her mobility. Anxious to get home  ROS: Patient denies fever, rash, sore throat, blurred vision, dizziness, nausea, vomiting, diarrhea, cough, shortness of breath or chest pain,   headache, or mood change.    Objective:   No results found.  No results for input(s): "WBC", "HGB", "HCT", "PLT" in the last 72 hours.  No results for input(s): "NA", "K", "CL", "CO2", "GLUCOSE", "BUN", "CREATININE", "CALCIUM" in the last 72 hours.   Intake/Output Summary (Last 24 hours) at 10/22/2021 1109 Last data filed at 10/22/2021 0830 Gross per 24 hour  Intake 720 ml  Output --  Net 720 ml        Physical Exam: Vital Signs Blood pressure 136/72, pulse 67, temperature 98.9 F (37.2 C), resp. rate 16, height 5\' 2"  (1.575 m), weight 70.6 kg, SpO2 96 %.   Constitutional: No distress . Vital signs reviewed. HEENT: NCAT, EOMI, oral membranes moist Neck: supple Cardiovascular: RRR without murmur. No JVD    Respiratory/Chest: CTA Bilaterally without wheezes or rales. Normal effort    GI/Abdomen: BS +, non-tender, non-distended Ext: no clubbing, cyanosis, or edema Psych: pleasant and cooperative  Skin: No evidence of breakdown, no evidence of rash. Wounds cdi Neuro:  Alert and oriented x 3. Normal insight and awareness. Intact Memory. Normal language and speech. Cranial nerve exam unremarkable. Motor limited by ortho/pain left arm and leg. Sensory exam normal for light touch and pain in all 4 limbs. No limb ataxia or cerebellar signs. No abnormal tone appreciated.   Musculoskeletal: Left arm in splint which is fitting appropriately. Bruising and swelling   left knee and thighs improving.     Assessment/Plan: 1. Functional deficits which require 3+ hours per day of interdisciplinary therapy in a comprehensive inpatient rehab setting. Physiatrist is  providing close team supervision and 24 hour management of active medical problems listed below. Physiatrist and rehab team continue to assess barriers to discharge/monitor patient progress toward functional and medical goals  Care Tool:  Bathing    Body parts bathed by patient: Right arm, Left arm, Chest, Abdomen, Face, Right upper leg, Left upper leg         Bathing assist Assist Level: Moderate Assistance - Patient 50 - 74%     Upper Body Dressing/Undressing Upper body dressing   What is the patient wearing?: Button up shirt    Upper body assist Assist Level: Set up assist    Lower Body Dressing/Undressing Lower body dressing      What is the patient wearing?: Pants     Lower body assist Assist for lower body dressing: Maximal Assistance - Patient 25 - 49%     Toileting Toileting Toileting Activity did not occur (Clothing management and hygiene only): Refused  Toileting assist Assist for toileting: Contact Guard/Touching assist Assistive Device Comment: platform front wheel walker   Transfers Chair/bed transfer  Transfers assist     Chair/bed transfer assist level: Supervision/Verbal cueing Chair/bed transfer assistive device:   Ambulation assist      Assist level: Supervision/Verbal cueing Assistive device: Walker-platform Max distance: 25  Walk 10 feet activity   Assist  Walk 10 feet activity did not occur: Safety/medical concerns (unable due to endurance/pain)  Assist level: Supervision/Verbal cueing Assistive device: Walker-platform   Walk 50 feet activity   Assist Walk 50 feet with 2 turns activity did not occur: Safety/medical concerns  Assist level:  (unable due to endurance/pain)      Walk 150 feet activity   Assist Walk 150 feet activity did not occur: Safety/medical concerns         Walk 10 feet on uneven surface  activity   Assist Walk 10 feet on uneven surfaces activity did not  occur: Safety/medical concerns         Wheelchair     Assist Is the patient using a wheelchair?: Yes Type of Wheelchair: Manual    Wheelchair assist level: Supervision/Verbal cueing Max wheelchair distance: 100 ft    Wheelchair 50 feet with 2 turns activity    Assist        Assist Level: Supervision/Verbal cueing   Wheelchair 150 feet activity     Assist      Assist Level: Maximal Assistance - Patient 25 - 49%   Blood pressure 136/72, pulse 67, temperature 98.9 F (37.2 C), resp. rate 16, height 5\' 2"  (1.575 m), weight 70.6 kg, SpO2 96 %.  Medical Problem List and Plan: 1. Functional deficits secondary to critical polytrauma; left wrist fracture/dislocation s/p ORIF, left femoral condyle fracture- NWB LUE distally and TDWB on LLE             -patient may  shower if they wrap LUE             -ELOS/Goals: 8/21 mod I to supervision, min a stairs for home entry  -Continue [name] therapies including PT, OT    2.  Antithrombotics: -DVT/anticoagulation:  Pharmaceutical: Lovenox             -antiplatelet therapy: n/a 3. Pain Management:   robaxin,  and Ultram prn                -dc'ed regular Tylenol due to elevated LFTs             - con't tramadol and  Norco 5/325 mg for therapy to help control pain-  -lidocaine patches  -8/17 norco prior to therapies in am  4. Mood/Behavior/Sleep: team providing emotional support             -antipsychotic agents: n/a 5. Neuropsych/cognition: This patient is capable of making decisions on her own behalf. 6. Skin/Wound Care: local care 7. Fluids/Electrolytes/Nutrition: discussed nutrition. Boost shakes are fine. added protein supplement as well.  -added calcium and D for bones 8: Left wrist fracture/dislocation s/p ORIF 8/1; in splint             -platform weight-bear, no WB through wrist  -re-wrapped splint 8/7  - Dr. 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 High blood pressure- norvasc added 8/7   Vitals:   10/21/21 1947 10/22/21 0422  BP: (!) 143/68 136/72  Pulse: 69 67  Resp: 16 16  Temp: 98 F (36.7  C) 98.9 F (37.2 C)  SpO2: 97% 96%     -norvasc at 10mg  daily currently.    -bp controlled 8/17 10. Constipation:  -added senna-s bid, stopped colace  -resumed shceduled miralax   -regimen working well  LOS: 11 days A FACE TO FACE EVALUATION WAS PERFORMED  9/17 10/22/2021, 11:09 AM

## 2021-10-22 NOTE — Progress Notes (Signed)
Occupational Therapy Session Note  Patient Details  Name: Katherine Atkins MRN: [id number] Date of Birth: 04/04/1940   Today's Date: 10/22/2021 OT Individual Time: 705-815  OT Individual Time Calculation (min): 70 min   Today's Date: 10/22/2021 OT Individual Time: 1300-1324 OT Individual Time Calculation (min): 24 min   Short Term Goals: Week 1:  OT Short Term Goal 1 (Week 1): Pt will complete 1/3 toileting steps with no more than min A for balance using LRAD OT Short Term Goal 1 - Progress (Week 1): Met OT Short Term Goal 2 (Week 1): Pt will donn LB clothing with min A using AE PRN OT Short Term Goal 2 - Progress (Week 1): Met OT Short Term Goal 3 (Week 1): Pt will complete ambulatory toilet transfer with min A to promote OOB toileting OT Short Term Goal 3 - Progress (Week 1): Met Week 2:  OT Short Term Goal 1 (Week 2): STG=LTG d.t ELOS  Skilled Therapeutic Interventions/Progress Updates:    Session 1: Pt received in bed with pain premedicated. Rest and repositioning provided ADL: Pt completes ADL at overall set up for footwear in bed, and distant supervision for shirt and pants. Pt requires cuing for pacing, positioning and sequencing of dressing to decrease energy expenditure with gathering clothing/dressing. Pt completes all mobility with no cuing. Pt    Therapeutic exercise Demoed hip flexor stretch supine on mat. Pt able to complete with S and reported it feeling "good" on L side. Pt able to scoot to other side of the mat and complete on R side.  Pt left at end of session in bed with exit alarm on, call light in reach and all needs met  Session 2: Family education with [name] and Debbie scheduled but pt incorrectly told family to come at 145pm after session. Pt and OT devise recording a video going over general mobility tips, supervising shower transfers and cuing for LUE precaution management for pat to refer to. Pt demos regular chair transfer, functional mobility and a shower  transfers in video using TTB to cross over shower theshold. Pt and OT plan to shower again tomorrow and for pt to gather all needed items, materials and wrap her own arm. Pt writes down tip to use BSC bucket under foot to relieve pain while seated in shower. Exited session with pt seated in recliner, exit alarm on and call light in reach   Therapy Documentation Precautions:  Precautions Precautions: Fall Precaution Comments: rib fx, pelvic fx Required Braces or Orthoses: Splint/Cast Splint/Cast: LUE Restrictions Weight Bearing Restrictions: Yes LUE Weight Bearing: Non weight bearing LLE Weight Bearing: Touchdown weight bearing Other Position/Activity Restrictions: NWB L wrist General:     Therapy/Group:  Individual Therapy Stephanie M Schlosser 10/22/2021, 6:44 AM 

## 2021-10-22 NOTE — Progress Notes (Signed)
Patient educated regarding pain medications and alternative pain management (ice). Discussed constipation side effect.

## 2021-10-22 NOTE — Progress Notes (Signed)
Physical Therapy Session Note  Patient Details  Name: Katherine Atkins MRN: [id number] Date of Birth: [birth year]  Today's Date: 10/22/2021 PT Individual Time: 0930-1015 and 1440-1600 PT Individual Time Calculation (min): 45 min and 80 min   Short Term Goals: Week 2:  PT Short Term Goal 1 (Week 2): STG=LTG due to ELOS.  Skilled Therapeutic Interventions/Progress Updates:     Session 1: Patient in recliner in the room upon PT arrival. Patient alert and agreeable to PT session. Patient reported 7/10 L pelvic pain during session, patient reports she was pre-medicated, but having less relief this morning, RN made aware. PT provided repositioning, rest breaks, and distraction as pain interventions throughout session.  Spent increased time discussing d/c planning, ramp installation, and sequencing of entering/exiting her home. Patient reports she was unable to call the ramp company to set up installation due to being unable to dial out using the hospital phone. Plan to assist patient and caregivers with calling this afternoon during family education.    Therapeutic Activity: Transfers: Patient performed sit to/from stand x2 with set-up assist using L PFRW.   Gait Training:  Patient ambulated 25 feet and 10 feet using LPFRW with supervision-mod I using hop-to gait pattern.   Wheelchair Mobility:  Patient propelled wheelchair up/down a 10 foot ramp x2 ascending backwards and descending forwards using L hemi-technique with min A-CGA to simulate home entry/exit. Provided verbal cues for technique.   Patient in recliner in the room at end of session with breaks locked and all needs within reach.   Session 2: Patient in recliner with her caregivers in the room upon PT arrival. Patient alert and agreeable to PT session. Patient reported 3-4/10 L pelvic pain during session, RN made aware. PT provided repositioning, rest breaks, and distraction as pain interventions throughout session.   Patient's  house-mate, Katherine Atkins, and neighbor, Katherine Atkins, were present for family education and hands on training throughout session. Performed safe guarding with all mobility following PT cues and/or demonstration. Educated on fall risk/prevention, home modifications to prevent falls, and activation of emergency services in the event of a fall during session.   Educated on patient's goals for supervision-mod I with L PFRW for household mobility, setting BSC by the bed at night for night time toileting for safety with increased independence, use of w/c for home entry/exit to the car on the ramp, calling for w/c access at medical and therapy appointments to reduce burden of car, as her house-mate will not be physically able to lift the w/c in the car, use of a cell phone in the patient's pocket at all times to call the land line to call Katherine Atkins for assistance or needs or 911 in the event of an emergency, and patient to perform HEP (handout provided) 2x per day until follow-up therapy evaluation. Patient and her house-mate wrote down notes for recall of recommendations throughout session. Patient and caregivers in agreement with all recommendations. PT assisted patient with setting up ramp estimate to ensure ramp placement prior to or just after d/c, CSW made aware. Patient made need late d/c if ramp is placed Monday, or will elicit assist from multiple neighbors to bump patient up the steps into her home, with plan for ramp to be placed by 8/22 at the latest.   Therapeutic Activity: Transfers: Patient performed sit to/from stand x2 with set-up assist-mod I using L PFRW.  Patient performed a simulated sedan height car transfer with supervision using L PFRW x2. Provided cues for safe technique and management  of w/c and parts.  Gait Training:  Patient ambulated ~15 feet x2 using L PFRW with supervision-mod I. Ambulated with hop-to gait pattern on R.   Wheelchair Mobility:  Patient propelled wheelchair up/down a 10 foot ramp x3 (x1  with demonstration of guarding technique by PT, x1 with [name], and x1 with full simulation of exiting the home getting in the car, getting out of the car and entering the home with Katherine Atkins assisting with instructions from [place]. Provided demonstration and verbal cues for ascending backwards and descending forwards and use of effective communication for safety.   Patient in recliner with caregivers in the room at end of session with breaks locked and all needs within reach.   Therapy Documentation Precautions:  Precautions Precautions: Fall Precaution Comments: rib fx, pelvic fx Required Braces or Orthoses: Splint/Cast Splint/Cast: LUE Restrictions Weight Bearing Restrictions: Yes LUE Weight Bearing: Weight bear through elbow only LLE Weight Bearing: Touchdown weight bearing Other Position/Activity Restrictions: NWB L wrist    Therapy/Group: Individual Therapy  Jaziel Bennett L Denitra Donaghey PT, DPT, NCS, CBIS  10/22/2021, 5:08 PM

## 2021-10-22 NOTE — Progress Notes (Signed)
Patient ID: Katherine Atkins, female   DOB: 11/03/1940, 81 y.o.   MRN: [id number]  SW met with pt, pt house mate [name] and friend [name] to review d/c. SW provided updates from [facility] to inform on how they will need to follow-up to make co-payments. SW discussed with them w/c options. They will follow-up with PT-Cherie to discuss further.   Loralee Pacas, MSW, [place] Office: [phone] Cell: [phone] Fax: [phone]

## 2021-10-23 NOTE — Progress Notes (Signed)
PROGRESS NOTE   Subjective/Complaints: Still has some pelvic pain but overall feels better and anxious to get home!  ROS: Patient denies fever, rash, sore throat, blurred vision, dizziness, nausea, vomiting, diarrhea, cough, shortness of breath or chest pain, neck pain, headache, or mood change.   Objective:   No results found.  No results for input(s): "WBC", "HGB", "HCT", "PLT" in the last 72 hours.  No results for input(s): "NA", "K", "CL", "CO2", "GLUCOSE", "BUN", "CREATININE", "CALCIUM" in the last 72 hours.   Intake/Output Summary (Last 24 hours) at 10/23/2021 0909 Last data filed at 10/23/2021 0644 Gross per 24 hour  Intake 360 ml  Output 650 ml  Net -290 ml        Physical Exam: Vital Signs Blood pressure (!) 151/90, pulse 92, temperature 98.2 F (36.8 C), temperature source Oral, resp. rate 16, height 5\' 2"  (1.575 m), weight 70.6 kg, SpO2 97 %.   Constitutional: No distress . Vital signs reviewed. HEENT: NCAT, EOMI, oral membranes moist Neck: supple Cardiovascular: RRR without murmur. No JVD    Respiratory/Chest: CTA Bilaterally without wheezes or rales. Normal effort    GI/Abdomen: BS +, non-tender, non-distended Ext: no clubbing, cyanosis, or edema Psych: pleasant and cooperative  Skin: No evidence of breakdown, no evidence of rash. Wounds cdi Neuro:  Alert and oriented x 3. Normal insight and awareness. Intact Memory. Normal language and speech. Cranial nerve exam unremarkable. Motor limited by ortho/pain left arm and leg. Sensory exam normal for light touch and pain in all 4 limbs. No limb ataxia or cerebellar signs. No abnormal tone appreciated.   Musculoskeletal: Left arm in splint which is fitting appropriately. Bruising and swelling resolving. Still with discomfort with sitting upright   Assessment/Plan: 1. Functional deficits which require 3+ hours per day of interdisciplinary therapy in a  comprehensive inpatient rehab setting. Physiatrist is providing close team supervision and 24 hour management of active medical problems listed below. Physiatrist and rehab team continue to assess barriers to discharge/monitor patient progress toward functional and medical goals  Care Tool:  Bathing    Body parts bathed by patient: Right arm, Left arm, Chest, Abdomen, Face, Right upper leg, Left upper leg         Bathing assist Assist Level: Moderate Assistance - Patient 50 - 74%     Upper Body Dressing/Undressing Upper body dressing   What is the patient wearing?: Button up shirt    Upper body assist Assist Level: Set up assist    Lower Body Dressing/Undressing Lower body dressing      What is the patient wearing?: Pants     Lower body assist Assist for lower body dressing: Maximal Assistance - Patient 25 - 49%     Toileting Toileting Toileting Activity did not occur and hygiene only): Refused  Toileting assist Assist for toileting: Contact Guard/Touching assist Assistive Device Comment: platform front wheel walker   Transfers Chair/bed transfer  Transfers assist     Chair/bed transfer assist level: Set up assist Chair/bed transfer assistive device: Press photographer   Ambulation assist      Assist level: Set up assist Assistive device: Walker-platform Max distance: 25  ft   Walk 10 feet activity   Assist  Walk 10 feet activity did not occur: Safety/medical concerns (unable due to endurance/pain)  Assist level: Set up assist Assistive device: Walker-platform   Walk 50 feet activity   Assist Walk 50 feet with 2 turns activity did not occur: Safety/medical concerns  Assist level:  (unable due to endurance/pain)      Walk 150 feet activity   Assist Walk 150 feet activity did not occur: Safety/medical concerns         Walk 10 feet on uneven surface  activity   Assist Walk 10 feet on uneven surfaces  activity did not occur: Safety/medical concerns         Wheelchair     Assist Is the patient using a wheelchair?: Yes Type of Wheelchair: Manual    Wheelchair assist level: Supervision/Verbal cueing Max wheelchair distance: 100 ft    Wheelchair 50 feet with 2 turns activity    Assist        Assist Level: Supervision/Verbal cueing   Wheelchair 150 feet activity     Assist      Assist Level: Maximal Assistance - Patient 25 - 49%   Blood pressure (!) 151/90, pulse 92, temperature 98.2 F (36.8 C), temperature source Oral, resp. rate 16, height 5\' 2"  (1.575 m), weight 70.6 kg, SpO2 97 %.  Medical Problem List and Plan: 1. Functional deficits secondary to critical polytrauma; left wrist fracture/dislocation s/p ORIF, left femoral condyle fracture- NWB LUE distally and TDWB on LLE             -patient may  shower if they wrap LUE             -ELOS/Goals: 8/21 mod I to supervision, min a stairs for home entry  -Continue [name] therapies including PT, OT     2.  Antithrombotics: -DVT/anticoagulation:  Pharmaceutical: Lovenox             -antiplatelet therapy: n/a 3. Pain Management:   robaxin,  and Ultram prn                -dc'ed regular Tylenol due to elevated LFTs             - con't tramadol and  Norco 5/325 mg for therapy to help control pain-  -lidocaine patches  -8/18 pain control adequate. Can send her home with tramadol and norco 4. Mood/Behavior/Sleep: team providing emotional support             -antipsychotic agents: n/a 5. Neuropsych/cognition: This patient is capable of making decisions on her own behalf. 6. Skin/Wound Care: local care 7. Fluids/Electrolytes/Nutrition: discussed nutrition. Boost shakes are fine. added protein supplement as well.  -added calcium and D for bones 8: Left wrist fracture/dislocation s/p ORIF 8/1; in splint             -platform weight-bear, no WB through wrist  -re-wrapped splint 8/7  - Dr. 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 High blood pressure- norvasc added 8/7   Vitals:   10/23/21 0401 10/23/21 0822  BP: 118/86 (!) 151/90  Pulse: 92   Resp: 16   Temp: 98.2 F (36.8 C)   SpO2: 97%      -norvasc at 10mg  daily currently.    -bp sl elevated today 8/18--obsv only for now 10. Constipation:  -added senna-s bid, stopped colace  -resumed shceduled miralax   -regimen working well  LOS: 12 days A FACE TO FACE EVALUATION WAS PERFORMED  [birth year]31-1981 10/23/2021, 9:09 AM

## 2021-10-23 NOTE — Progress Notes (Signed)
Physical Therapy Session Note  Patient Details  Name: Katherine Atkins MRN: [id number] Date of Birth: 06-Dec-1940  Today's Date: 10/23/2021 PT Individual Time: 8101-7510 PT Individual Time Calculation (min): 58 min   Short Term Goals: Week 2:  PT Short Term Goal 1 (Week 2): STG=LTG due to ELOS.  Skilled Therapeutic Interventions/Progress Updates:     Pt received seated in recliner and agrees to therapy. Reports no pain. Pt performs sit to stand mod(I) with L Platform RW. Pt ambulates to toilet in room, x15', with L platform RW and verbal cues to maintain center of gravity in anterior foot to promote optimal balance.  Pt performs toilet transfer and transfer back to [facility] with cues for positioning. Pt self propels WC x100' to gym with several brief rest breaks, utilizing R lower extremity and R upper extremity for propulsion. Stand pivot transfer to mat with cues for safe positioning of RW.   PT reviews pt's HEP and pt performs the following in supine with verbal and tactile cues for correct performance: 1x15 ankle pumps, quad sets, isometric hip adduction with pillow at knees, glute sets, heel slides, SAQs. Pt attempts hip abduction with L lower extremity but cannot complete secondary to pain. Pt completes x5 SLRs with LLE and x15 LAQs with RLE. Pt performs squat pivot back to [facility] with cues for body positioning and sequencing. WC transport back to room. Pt left seated with all needs within reach.  Therapy Documentation Precautions:  Precautions Precautions: Fall Precaution Comments: rib fx, pelvic fx Required Braces or Orthoses: Splint/Cast Splint/Cast: LUE Restrictions Weight Bearing Restrictions: Yes LUE Weight Bearing: Non weight bearing LLE Weight Bearing: Touchdown weight bearing Other Position/Activity Restrictions: NWB L wrist    Therapy/Group: Individual Therapy  Beau Fanny, PT, DPT 10/23/2021, 3:43 PM

## 2021-10-23 NOTE — Progress Notes (Signed)
Occupational Therapy Session Note  Patient Details  Name: Lisanne Ponce MRN: [id number] Date of Birth: October 24, 1940  Today's Date: 10/23/2021 OT Individual Time: 0820-0930 OT Individual Time Calculation (min): 70 min   Today's Date: 10/23/2021 OT Individual Time: 1045-1200 OT Individual Time Calculation (min): 75 min  Short Term Goals: Week 1:  OT Short Term Goal 1 (Week 1): Pt will complete 1/3 toileting steps with no more than min A for balance using LRAD OT Short Term Goal 1 - Progress (Week 1): Met OT Short Term Goal 2 (Week 1): Pt will donn LB clothing with min A using AE PRN OT Short Term Goal 2 - Progress (Week 1): Met OT Short Term Goal 3 (Week 1): Pt will complete ambulatory toilet transfer with min A to promote OOB toileting OT Short Term Goal 3 - Progress (Week 1): Met  Skilled Therapeutic Interventions/Progress Updates:    Session 1: Pt received in bed with RN delivering pain medication.    ADL: Pt completes ADL at overall CGA-MOD I Level. Skilled interventions include: teaching pt towrap her L arm with 2 incorrect trials as cast would have gotten wet, cuing for positioning in shower for compfort teaching 3 options for seating/pt selecting from those options, cuing for self organization throughout ADL for gathering items as pt forgot to get linens for during/after shower, 1 cue for WB precautions throughout shower (much improved), cuing for not standing to don shirt as this is not safe, and CGA to keep RW from tipping laterally when advancing pants d/t pt leaning too far to the L and losing balance. Pt requires increased rest breaks post shower d/t needing extra rest from having to gather all items. Grooming in standing at sink as this will be how the pt completes it at home with MOD I.   Pt left at end of session in recliner with exit alarm on, call light in reach and all needs met  Session 2: Pt received in recliner with premedicated pain.  Therapeutic activity K tape  reapplied to hematoma on leg. Pt given extra  set for at home. Pt transfers to w/c with PFRW with MOD I. Pt propels w/c >250 feet with 4 rest breaks part way to outisde courtyard. Pt completes functional mobility to/from park bench with [place] over minimally uneven surface. Pt able to sit comfortably on park bench sideways with LLE in long sit and RLE in short sit. Discussed using this method for improved comfort during shower and use it for riding in the car if front seat is not comfortable. Propelled up ramp with MIN A overall for endruance and strengthening of R extremities during functional mobility.   Pt left at end of session in recliner with exit alarm on, call light in reach and all needs met   Therapy Documentation Precautions:  Precautions Precautions: Fall Precaution Comments: rib fx, pelvic fx Required Braces or Orthoses: Splint/Cast Splint/Cast: LUE Restrictions Weight Bearing Restrictions: Yes LUE Weight Bearing: Non weight bearing LLE Weight Bearing: Touchdown weight bearing Other Position/Activity Restrictions: NWB L wrist General:    Therapy/Group: Individual Therapy  Tonny Branch 10/23/2021, 6:51 AM

## 2021-10-23 NOTE — Progress Notes (Signed)
Patient ID: Katherine Atkins, female   DOB: [birth year], 81 y.o.   MRN: [id number]  SW met with pt in room to follow-up about ramp and DME. Pt reports he friend [name] was unable to get pictures to [employer] rep, so she understands there will likely not be able to have a ramp installed by Monday. States she is going to have her neighbor bump her up into the home in her w/c, if he is unable too, she reports she will use local police/fire department for assistance. SW discussed her DME being delivered to home, and the concern with items arriving to home on time because the co-pay still has not been made. Pt agreeable to DME coming to room.   *SW later met with pt who reported she made an effort to call [place] to make payment by phone but unable to make long distant phone calls. States she gave this information to her friend [name] who will be making the phone call. As of now, [employer] has not received phone call to make payment for DME.   Loralee Pacas, MSW, [place] Office: [phone] Cell: [phone] Fax: [phone]

## 2021-10-24 NOTE — Plan of Care (Signed)
  Problem: Consults Goal: RH GENERAL PATIENT EDUCATION Description: See Patient Education module for education specifics. Outcome: Progressing Goal: Skin Care Protocol Initiated - if Braden Score 18 or less Description: If consults are not indicated, leave blank or document N/A Outcome: Progressing   Problem: RH SKIN INTEGRITY Goal: RH STG MAINTAIN SKIN INTEGRITY WITH ASSISTANCE Description: STG Maintain Skin Integrity With Mod I Assistance. Outcome: Progressing Goal: RH STG ABLE TO PERFORM INCISION/WOUND CARE W/ASSISTANCE Description: STG Able To Perform Incision/Wound Care With Mod I Assistance. Outcome: Progressing   Problem: RH SAFETY Goal: RH STG ADHERE TO SAFETY PRECAUTIONS W/ASSISTANCE/DEVICE Description: STG Adhere to Safety Precautions With dues and reminders. Outcome: Progressing Goal: RH STG DECREASED RISK OF FALL WITH ASSISTANCE Description: STG Decreased Risk of Fall With Mod I Assistance. Outcome: Progressing   Problem: RH PAIN MANAGEMENT Goal: RH STG PAIN MANAGED AT OR BELOW PT'S PAIN GOAL Description: < 3 Outcome: Progressing   Problem: RH KNOWLEDGE DEFICIT GENERAL Goal: RH STG INCREASE KNOWLEDGE OF SELF CARE AFTER HOSPITALIZATION Description: Patient will demonstrate knowledge of medication management, skin/wound care, weight bearing precautions with educational materials and handouts provided by staff. Outcome: Progressing

## 2021-10-24 NOTE — Progress Notes (Signed)
Occupational Therapy Session Note  Patient Details  Name: Katherine Atkins MRN: [id number] Date of Birth: 1941/02/02  Today's Date: 10/24/2021 OT Individual Time: 5848-3507 OT Individual Time Calculation (min): 55 min   Today's Date: 10/24/2021 OT Individual Time: 1120-1200 OT Individual Time Calculation (min): 40 min   Short Term Goals: Week 1:  OT Short Term Goal 1 (Week 1): Pt will complete 1/3 toileting steps with no more than min A for balance using LRAD OT Short Term Goal 1 - Progress (Week 1): Met OT Short Term Goal 2 (Week 1): Pt will donn LB clothing with min A using AE PRN OT Short Term Goal 2 - Progress (Week 1): Met OT Short Term Goal 3 (Week 1): Pt will complete ambulatory toilet transfer with min A to promote OOB toileting OT Short Term Goal 3 - Progress (Week 1): Met  Skilled Therapeutic Interventions/Progress Updates:    Session 1: Pt received in bed with no pain.  ADL: Pt completes ADL at overall MOD I Level. Skilled interventions include: having pt teach back sequence of gathering all items/reasoning to demo improved awareness of self organization and energy conservation principles. Pt able to complete all tasks of full ADL at shower level with MOD I. Pt is able to gather all needed items and complete ADL in bathroon verbalizing differences in how it will be different at home (dressing from a chair in the bathroom v on toilet). Educated pt on use of reacher with pillow case tied around walker for reacher bag in case pt drops item. Pt verbalized understanding.   Pt left at end of session in recliner with exit alarm on, call light in reach and all needs met  Session 2: Pt received in recliner. No pain reported  Therapeutic activity Focus of session on talking through DC plans, follow up services, strategy for setting up bathroom/spare bathroom, adaptations for cleaning around the house, how to clean the [facility] and educating on not going out to the barn/on tractor till cleared  by PT/MD. Prolonged discussion d/t pt needing to write notes so she can recall later.   Pt left at end of session in recliner with exit alarm on, call light in reach and all needs met   Therapy Documentation Precautions:  Precautions Precautions: Fall Precaution Comments: rib fx, pelvic fx Required Braces or Orthoses: Splint/Cast Splint/Cast: LUE Restrictions Weight Bearing Restrictions: Yes LUE Weight Bearing: Non weight bearing LLE Weight Bearing: Touchdown weight bearing Other Position/Activity Restrictions: NWB L wrist General:    Therapy/Group: Individual Therapy  Tonny Branch 10/24/2021, 6:45 AM

## 2021-10-24 NOTE — Progress Notes (Signed)
Occupational Therapy Discharge Summary  Patient Details  Name: Katherine Atkins MRN: [id number] Date of Birth: 1940/12/10  Date of Discharge from OT service:October 25, 2021 Patient has met {NUMBERS 0-12:18577} of {NUMBERS 0-12:18577} long term goals due to improved activity tolerance, improved balance, postural control, ability to compensate for deficits, and functional use of  LEFT upper and LEFT lower extremity.  Patient to discharge at overall Modified Independent level.  Patient's care partner is independent to provide the necessary  set up  assistance at discharge.  Katherine Atkins has improved tremendously throughout her stay in [name] and has demonstrated MOD I level BADL and IADL adaptations. Pt continues to work on energy conservation strategies to allow her to complete the things she wants and needs to do at home. Pt is aware that barn/outside task completion may be limited in the short term but is excited and motivated to work towards those task in follow up therapies  Reasons goals not met: n/a  Recommendation:  Patient will benefit from ongoing skilled OT services in outpatient setting to continue to advance functional skills in the area of iADL.  Equipment: TTB  Reasons for discharge: treatment goals met and discharge from hospital  Patient/family agrees with progress made and goals achieved: Yes  OT Discharge Precautions/Restrictions    General   Vital Signs Therapy Vitals Pulse Rate: 75 BP: 137/81 Pain   ADL ADL Eating: Not assessed Grooming: Setup Where Assessed-Grooming: Sitting at sink Upper Body Bathing: Setup Where Assessed-Upper Body Bathing: Bed level Lower Body Bathing: Moderate assistance Where Assessed-Lower Body Bathing: Bed level, Edge of bed Upper Body Dressing: Setup Where Assessed-Upper Body Dressing: Edge of bed Lower Body Dressing: Maximal assistance Where Assessed-Lower Body Dressing: Edge of bed Toileting: Maximal assistance Where Assessed-Toileting:  Other (Comment) (simulated) Toilet Transfer: Minimal assistance Toilet Transfer Method: Stand pivot Science writer: Other (comment) (simulated to w/c) Tub/Shower Transfer: Unable to assess Tub/Shower Transfer Method: Unable to assess Social research officer, government: Unable to assess Social research officer, government Method: Unable to assess Vision Baseline Vision/History: 0 No visual deficits Patient Visual Report: No change from baseline Vision Assessment?: No apparent visual deficits Perception  Perception: Within Functional Limits Praxis Praxis: Intact Cognition Cognition Overall Cognitive Status: Within Functional Limits for tasks assessed Arousal/Alertness: Awake/alert Orientation Level: Person;Place;Situation Person: Oriented Place: Oriented Situation: Oriented Memory: Appears intact Safety/Judgment: Appears intact Brief Interview for Mental Status (BIMS) Repetition of Three Words (First Attempt): 3 Temporal Orientation: Year: Correct Temporal Orientation: Month: Accurate within 5 days Temporal Orientation: Day: Correct Recall: "Sock": Yes, no cue required Recall: "Blue": Yes, no cue required Recall: "Bed": Yes, no cue required BIMS Summary Score: 15 Sensation Sensation Light Touch: Appears Intact Hot/Cold: Not tested Proprioception: Appears Intact Stereognosis: Not tested Additional Comments: reports sensation is intact on LUE Coordination Gross Motor Movements are Fluid and Coordinated: No Fine Motor Movements are Fluid and Coordinated: Yes Coordination and Movement Description: generalized incoordination 2/2 WB restrictions, pain and debility Finger Nose Finger Test: Katherine Atkins bilaterally Motor  Motor Motor: Within Functional Limits Mobility  Bed Mobility Supine to Sit: Independent Sitting - Scoot to Edge of Bed: Independent Transfers Sit to Stand: Independent with assistive device  Trunk/Postural Assessment  Cervical Assessment Cervical Assessment: Within  Functional Limits Thoracic Assessment Thoracic Assessment:  (kyphotic) Lumbar Assessment Lumbar Assessment:  (post tilt) Postural Control Postural Control: Within Functional Limits  Balance Static Sitting Balance Static Sitting - Level of Assistance: 7: Independent Dynamic Sitting Balance Dynamic Sitting - Level of Assistance: 7: Independent Static Standing Balance  Static Standing - Level of Assistance: 6: Modified independent (Device/Increase time) Dynamic Standing Balance Dynamic Standing - Level of Assistance: 6: Modified independent (Device/Increase time) Extremity/Trunk Assessment RUE Assessment RUE Assessment: Within Functional Limits LUE Assessment LUE Assessment: Exceptions to [facility] Active Range of Motion (AROM) Comments: WFL proximally at shoulder and elbow, as well as digits, however wrist is immobilized with ACE wrap and plan for hard cast per acute note therefore unassessed General Strength Comments: NWB through wrist, elbow only, WFL during ADLs   Tonny Branch 10/24/2021, 10:01 AM

## 2021-10-24 NOTE — Progress Notes (Signed)
PROGRESS NOTE   Subjective/Complaints: No new issues. Asked me if I could write a prescription for her ramp at home. They will waive the sales tax if she has one.   ROS: Patient denies fever, rash, sore throat, blurred vision, dizziness, nausea, vomiting, diarrhea, cough, shortness of breath or chest pain,   headache, or mood change.   Objective:   No results found.  No results for input(s): "WBC", "HGB", "HCT", "PLT" in the last 72 hours.  No results for input(s): "NA", "K", "CL", "CO2", "GLUCOSE", "BUN", "CREATININE", "CALCIUM" in the last 72 hours.   Intake/Output Summary (Last 24 hours) at 10/24/2021 1002 Last data filed at 10/24/2021 0841 Gross per 24 hour  Intake 480 ml  Output 700 ml  Net -220 ml        Physical Exam: Vital Signs Blood pressure 137/81, pulse 75, temperature 98.2 F (36.8 C), temperature source Oral, resp. rate 16, height 5\' 2"  (1.575 m), weight 70.6 kg, SpO2 95 %.   Constitutional: No distress . Vital signs reviewed. HEENT: NCAT, EOMI, oral membranes moist Neck: supple Cardiovascular: RRR without murmur. No JVD    Respiratory/Chest: CTA Bilaterally without wheezes or rales. Normal effort    GI/Abdomen: BS +, non-tender, non-distended Ext: no clubbing, cyanosis, or edema Psych: pleasant and cooperative  Skin: No evidence of breakdown, no evidence of rash. Wounds cdi Neuro:  Alert and oriented x 3. Normal insight and awareness. Intact Memory. Normal language and speech. Cranial nerve exam unremarkable. Motor limited by ortho/pain left arm and leg. Sensory exam normal for light touch and pain in all 4 limbs. No limb ataxia or cerebellar signs. Tone is nl  Musculoskeletal: Left arm in splint. Bruising and swelling resolving.     Assessment/Plan: 1. Functional deficits which require 3+ hours per day of interdisciplinary therapy in a comprehensive inpatient rehab setting. Physiatrist is providing  close team supervision and 24 hour management of active medical problems listed below. Physiatrist and rehab team continue to assess barriers to discharge/monitor patient progress toward functional and medical goals  Care Tool:  Bathing    Body parts bathed by patient: Right arm, Left arm, Chest, Abdomen, Face, Right upper leg, Left upper leg, Front perineal area, Buttocks, Right lower leg, Left lower leg         Bathing assist Assist Level: Independent with assistive device     Upper Body Dressing/Undressing Upper body dressing   What is the patient wearing?: Button up shirt    Upper body assist Assist Level: Independent with assistive device    Lower Body Dressing/Undressing Lower body dressing      What is the patient wearing?: Pants     Lower body assist Assist for lower body dressing: Independent with assitive device     Toileting Toileting Toileting Activity did not occur (Clothing management and hygiene only): Refused  Toileting assist Assist for toileting: Independent with assistive device Assistive Device Comment: platform walker   Transfers Chair/bed transfer  Transfers assist     Chair/bed transfer assist level: Set up assist Chair/bed transfer assistive device:   Ambulation assist      Assist level: Set up assist  Assistive device: Walker-platform Max distance: 25 ft   Walk 10 feet activity   Assist  Walk 10 feet activity did not occur: Safety/medical concerns (unable due to endurance/pain)  Assist level: Set up assist Assistive device: Walker-platform   Walk 50 feet activity   Assist Walk 50 feet with 2 turns activity did not occur: Safety/medical concerns  Assist level:  (unable due to endurance/pain)      Walk 150 feet activity   Assist Walk 150 feet activity did not occur: Safety/medical concerns         Walk 10 feet on uneven surface  activity   Assist Walk 10 feet on uneven  surfaces activity did not occur: Safety/medical concerns         Wheelchair     Assist Is the patient using a wheelchair?: Yes Type of Wheelchair: Manual    Wheelchair assist level: Supervision/Verbal cueing Max wheelchair distance: 100 ft    Wheelchair 50 feet with 2 turns activity    Assist        Assist Level: Supervision/Verbal cueing   Wheelchair 150 feet activity     Assist      Assist Level: Maximal Assistance - Patient 25 - 49%   Blood pressure 137/81, pulse 75, temperature 98.2 F (36.8 C), temperature source Oral, resp. rate 16, height 5\' 2"  (1.575 m), weight 70.6 kg, SpO2 95 %.  Medical Problem List and Plan: 1. Functional deficits secondary to critical polytrauma; left wrist fracture/dislocation s/p ORIF, left femoral condyle fracture- NWB LUE distally and TDWB on LLE             -patient may  shower if they wrap LUE             -ELOS/Goals: 8/21 mod I to supervision, min a stairs for home entry  -Continue [name] therapies including PT, OT    -she needs to find fax # for ramp rx. I'll fax it when she finds it.  2.  Antithrombotics: -DVT/anticoagulation:  Pharmaceutical: Lovenox             -antiplatelet therapy: n/a 3. Pain Management:   robaxin,  and Ultram prn                -dc'ed regular Tylenol due to elevated LFTs             - con't tramadol and  Norco 5/325 mg for therapy to help control pain-  -lidocaine patches  -8/19 pain control adequate. Can send her home with tramadol and norco 4. Mood/Behavior/Sleep: team providing emotional support             -antipsychotic agents: n/a 5. Neuropsych/cognition: This patient is capable of making decisions on her own behalf. 6. Skin/Wound Care: local care 7. Fluids/Electrolytes/Nutrition: discussed nutrition. Boost shakes are fine. added protein supplement as well.  -added calcium and D for bones 8: Left wrist fracture/dislocation s/p ORIF 8/1; in splint             -platform weight-bear, no WB  through wrist  -re-wrapped splint 8/7  - Dr. 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 High blood pressure- norvasc added 8/7   Vitals:   10/24/21 0313 10/24/21 0851  BP: 128/63 137/81  Pulse: 64 75  Resp: 16   Temp: 98.2 F (36.8 C)   SpO2: 95%      -norvasc at 10mg  daily currently.    -bp generally well controlled 8/19 10. Constipation:  -added senna-s bid, stopped colace  -resumed shceduled miralax   -regimen working for her  LOS: 13 days A FACE TO FACE EVALUATION WAS PERFORMED  9/19 10/24/2021, 10:02 AM

## 2021-10-25 NOTE — Plan of Care (Signed)
  Problem: Consults Goal: RH GENERAL PATIENT EDUCATION Description: See Patient Education module for education specifics. Outcome: Progressing Goal: Skin Care Protocol Initiated - if Braden Score 18 or less Description: If consults are not indicated, leave blank or document N/A Outcome: Progressing   Problem: RH SKIN INTEGRITY Goal: RH STG MAINTAIN SKIN INTEGRITY WITH ASSISTANCE Description: STG Maintain Skin Integrity With Mod I Assistance. Outcome: Progressing Goal: RH STG ABLE TO PERFORM INCISION/WOUND CARE W/ASSISTANCE Description: STG Able To Perform Incision/Wound Care With Mod I Assistance. Outcome: Progressing   Problem: RH SAFETY Goal: RH STG ADHERE TO SAFETY PRECAUTIONS W/ASSISTANCE/DEVICE Description: STG Adhere to Safety Precautions With dues and reminders. Outcome: Progressing Goal: RH STG DECREASED RISK OF FALL WITH ASSISTANCE Description: STG Decreased Risk of Fall With Mod I Assistance. Outcome: Progressing   Problem: RH PAIN MANAGEMENT Goal: RH STG PAIN MANAGED AT OR BELOW PT'S PAIN GOAL Description: < 3 Outcome: Progressing   Problem: RH KNOWLEDGE DEFICIT GENERAL Goal: RH STG INCREASE KNOWLEDGE OF SELF CARE AFTER HOSPITALIZATION Description: Patient will demonstrate knowledge of medication management, skin/wound care, weight bearing precautions with educational materials and handouts provided by staff. Outcome: Progressing   

## 2021-10-25 NOTE — Progress Notes (Signed)
Occupational Therapy Session Note  Patient Details  Name: Katherine Atkins MRN: [id number] Date of Birth: [birth year]  Today's Date: 10/25/2021 OT Individual Time: 1300-1415 OT Individual Time Calculation (min): 75 min    Short Term Goals: Week 1:  OT Short Term Goal 1 (Week 1): Pt will complete 1/3 toileting steps with no more than min A for balance using LRAD OT Short Term Goal 1 - Progress (Week 1): Met OT Short Term Goal 2 (Week 1): Pt will donn LB clothing with min A using AE PRN OT Short Term Goal 2 - Progress (Week 1): Met OT Short Term Goal 3 (Week 1): Pt will complete ambulatory toilet transfer with min A to promote OOB toileting OT Short Term Goal 3 - Progress (Week 1): Met  Skilled Therapeutic Interventions/Progress Updates:    Upon OT arrival, pt seated in recliner reporting no pain and is agreeable to OT treatment session. Treatment intervention with a focus on self care training, IADLs, functional mobility, home accessibility, and strengthening. Therapist discussed plan for [facility] placement in the evenings to improve independence and decrease risk for falls now that pt is independent in the room. Pt verbalized understanding of plan. Pt completes sit to stand transfer with Mod I using PFRW and retrieves items around her room and bathroom to prepare for her discharge tomorrow. Pt able to reach in all planes with Mod I using PFRW and requires seated rest break after task. Therapist and pt discuss discharge process for tomorrow as pt has questions about it and verbalizes an understanding of information provided. Pt requests to work on ramp entrance outside. Pt was transported outside via w/c and Total A for time and completes sit to stand transfer with PFRW and Mod I and ambulates up and down ramp with Mod I at a controlled pace. Pt fatigued after task and completes stand to sit transfer with Mod I using PFRW. Pt was transported back to rehab department in dayroom gym via w/c and Total A and  began to fold laundry items using the R UE with a 2.5 lb wrist weight. During task pt c/o increased pain at 7/10 in pelvis and requests to return to her room. Pt was transported back to her room via w/c and completes stand pivot transfer to recliner Mod I. Pt was left in recliner at end of session requesting pain medication. All needs met.   Therapy Documentation Precautions:  Precautions Precautions: Fall Precaution Comments: rib fx, pelvic fx Required Braces or Orthoses: Splint/Cast Splint/Cast: LUE Restrictions Weight Bearing Restrictions: Yes LUE Weight Bearing: Non weight bearing LLE Weight Bearing: Touchdown weight bearing Other Position/Activity Restrictions: NWB L wrist    Therapy/Group: Individual Therapy  Katherine Atkins 10/25/2021, 3:32 PM

## 2021-10-25 NOTE — Progress Notes (Signed)
PROGRESS NOTE   Subjective/Complaints: Pt feeling well. Had information together for me for ramp.   ROS: Patient denies fever, rash, sore throat, blurred vision, dizziness, nausea, vomiting, diarrhea, cough, shortness of breath or chest pain,   headache, or mood change.   Objective:   No results found.  No results for input(s): "WBC", "HGB", "HCT", "PLT" in the last 72 hours.  No results for input(s): "NA", "K", "CL", "CO2", "GLUCOSE", "BUN", "CREATININE", "CALCIUM" in the last 72 hours.   Intake/Output Summary (Last 24 hours) at 10/25/2021 0811 Last data filed at 10/25/2021 0742 Gross per 24 hour  Intake 240 ml  Output 700 ml  Net -460 ml        Physical Exam: Vital Signs Blood pressure 120/75, pulse 67, temperature 97.7 F (36.5 C), temperature source Oral, resp. rate 16, height 5\' 2"  (1.575 m), weight 70.6 kg, SpO2 97 %.   Constitutional: No distress . Vital signs reviewed. HEENT: NCAT, EOMI, oral membranes moist Neck: supple Cardiovascular: RRR without murmur. No JVD    Respiratory/Chest: CTA Bilaterally without wheezes or rales. Normal effort    GI/Abdomen: BS +, non-tender, non-distended Ext: no clubbing, cyanosis, or edema Psych: pleasant and cooperative  Skin: No evidence of breakdown, no evidence of rash. Wounds cdi Neuro:  Alert and oriented x 3. Normal insight and awareness. Intact Memory. Normal language and speech. Cranial nerve exam unremarkable. Motor limited by ortho/pain left arm and leg. Sensory exam normal for light touch and pain in all 4 limbs. No limb ataxia or cerebellar signs. Tone is nl  Musculoskeletal: Left arm in splint. Bruising and swelling resolving.     Assessment/Plan: 1. Functional deficits which require 3+ hours per day of interdisciplinary therapy in a comprehensive inpatient rehab setting. Physiatrist is providing close team supervision and 24 hour management of active medical  problems listed below. Physiatrist and rehab team continue to assess barriers to discharge/monitor patient progress toward functional and medical goals  Care Tool:  Bathing    Body parts bathed by patient: Right arm, Left arm, Chest, Abdomen, Face, Right upper leg, Left upper leg, Front perineal area, Buttocks, Right lower leg, Left lower leg         Bathing assist Assist Level: Independent with assistive device     Upper Body Dressing/Undressing Upper body dressing   What is the patient wearing?: Button up shirt    Upper body assist Assist Level: Independent with assistive device    Lower Body Dressing/Undressing Lower body dressing      What is the patient wearing?: Pants     Lower body assist Assist for lower body dressing: Independent with assitive device     Toileting Toileting Toileting Activity did not occur (Clothing management and hygiene only): Refused  Toileting assist Assist for toileting: Independent with assistive device Assistive Device Comment: platform walker   Transfers Chair/bed transfer  Transfers assist     Chair/bed transfer assist level: Set up assist Chair/bed transfer assistive device:   Ambulation assist      Assist level: Set up assist Assistive device: Walker-platform Max distance: 25 ft   Walk 10 feet activity   Assist  Walk 10 feet activity did not occur: Safety/medical concerns (unable due to endurance/pain)  Assist level: Set up assist Assistive device: Walker-platform   Walk 50 feet activity   Assist Walk 50 feet with 2 turns activity did not occur: Safety/medical concerns  Assist level:  (unable due to endurance/pain)      Walk 150 feet activity   Assist Walk 150 feet activity did not occur: Safety/medical concerns         Walk 10 feet on uneven surface  activity   Assist Walk 10 feet on uneven surfaces activity did not occur: Safety/medical concerns          Wheelchair     Assist Is the patient using a wheelchair?: Yes Type of Wheelchair: Manual    Wheelchair assist level: Supervision/Verbal cueing Max wheelchair distance: 100 ft    Wheelchair 50 feet with 2 turns activity    Assist        Assist Level: Supervision/Verbal cueing   Wheelchair 150 feet activity     Assist      Assist Level: Maximal Assistance - Patient 25 - 49%   Blood pressure 120/75, pulse 67, temperature 97.7 F (36.5 C), temperature source Oral, resp. rate 16, height 5\' 2"  (1.575 m), weight 70.6 kg, SpO2 97 %.  Medical Problem List and Plan: 1. Functional deficits secondary to critical polytrauma; left wrist fracture/dislocation s/p ORIF, left femoral condyle fracture- NWB LUE distally and TDWB on LLE             -patient may  shower if they wrap LUE             -ELOS/Goals: 8/21 mod I to supervision, min a stairs for home entry  -Continue [name] therapies including PT, OT    -rx for ramp written. Will try to fax today   -f/u with ortho and [facility] 2.  Antithrombotics: -DVT/anticoagulation:  Pharmaceutical: Lovenox             -antiplatelet therapy: n/a 3. Pain Management:   robaxin,  and Ultram prn                -dc'ed regular Tylenol due to elevated LFTs             - con't tramadol and  Norco 5/325 mg for therapy to help control pain-  -lidocaine patches  -8/20 pain control adequate. Can send her home with tramadol and norco 4. Mood/Behavior/Sleep: team providing emotional support             -antipsychotic agents: n/a 5. Neuropsych/cognition: This patient is capable of making decisions on her own behalf. 6. Skin/Wound Care: local care 7. Fluids/Electrolytes/Nutrition: discussed nutrition. Boost shakes are fine. added protein supplement as well.  -added calcium and D for bones 8: Left wrist fracture/dislocation s/p ORIF 8/1; in splint             -platform weight-bear, no WB through wrist  -re-wrapped splint 8/7  - Dr. 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 High blood pressure- norvasc added 8/7   Vitals:   10/24/21 1920 10/25/21 0330  BP:  122/70 120/75  Pulse: 72 67  Resp: 18 16  Temp: 98.1 F (36.7 C) 97.7 F (36.5 C)  SpO2: 99% 97%     -norvasc at 10mg  daily currently.    -bp generally well controlled 8/20 10. Constipation:  -added senna-s bid, stopped colace  -resumed shceduled miralax   -regimen working for her  LOS: 14 days A FACE TO FACE EVALUATION WAS PERFORMED  9/20 10/25/2021, 8:11 AM

## 2021-10-25 NOTE — Progress Notes (Signed)
Physical Therapy Discharge Summary  Patient Details  Name: Katherine Atkins MRN: [id number] Date of Birth: 07-31-1940  Date of Discharge from PT service:October 25, 2021  Today's Date: 10/25/2021 PT Individual Time: 1000-1100 PT Individual Time Calculation (min): 60 min    Patient has met 8 of 8 long term goals due to improved activity tolerance, improved balance, improved postural control, increased strength, increased range of motion, decreased pain, and ability to compensate for deficits.  Patient to discharge at an ambulatory level for short household distances and wheelchair level in the community Modified Independent using L PFRW.   Patient's care partner is independent to provide the necessary  supervision  assistance at discharge.  Reasons goals not met: All PT goals met at this time.  Recommendation:  Patient will benefit from ongoing skilled PT services in outpatient setting to continue to advance safe functional mobility, address ongoing impairments in balance, activity tolerance, strength/ROM, functional mobility, gait and stair training, patient/caregiver education, and minimize fall risk.  Equipment: Recommending: L PFRW, 18"x18" wheelchair with B ELRs, ramp for home enty  Reasons for discharge: treatment goals met  Patient/family agrees with progress made and goals achieved: Yes  Skilled Therapeutic Intervention: Patient in bed upon PT arrival. Patient alert and agreeable to PT session. Patient reported 5-6/10 L pelvic pain during session, reports she was pre-medicated prior to session. PT provided repositioning, rest breaks, and distraction as pain interventions throughout session.   Focused session on d/c assessment and review of home entry tomorrow for successful d/c in the morning. Patient reports that the ramp will be installed tomorrow morning and take ~1 hour for installation. Patient planning to d/c on time between 10am-12pm. Patient reports difficulty with arranging  payment for equipment from [employer], states this has been resolved, but not in time for home delivery of w/c and TTB, reports equipment will be delivered to her room in [name] prior to d/c tomorrow per her phone conversation, will follow-up with CSW. Plan for patient's neighbor, Mikki Santee, to assist with unloading w/c and TTB due to increased weight of items.   Therapeutic Activity: Bed Mobility: Patient performed rolling R/L and supine to/from sit with mod I for increased time/effort due to pain and weight bearing precautions. Performed in a flat bed without use of bed rails to simulate home set-up. Transfers: Patient performed sit to/from stand with mod I using L PFRW from bed, w/c, and recliner. Patient performed a simulated sedan height car transfer using L PFRW with supervision simulating entering the back bench seat of the vehicle from the driver side to allow for L leg elevation during ride home. Used teach back method for transfer set-up and sequencing to demonstrate patient is able to guide her caregivers through the transfer.   Gait Training:  Patient ambulated 25 feet and 50 feet using L PFRW with mod I. Ambulated with hop-to gait pattern on R with good foot clearance and placement and demonstrated safe use of AD throughout.   Wheelchair Mobility:  Patient propelled wheelchair 150 feet with mod I, x3 brief sitting rest breaks to reposition her pelvis in the w/c. Patient demonstrated ability to don/doff B leg rests and manage breaks without cues this session. Patient propelled the wheelchair up/down a ramp ascending backwards and descending forwards with R hemi-technique with CGA for safety. Patient able to cue PT in assist and teach back directions for ascending/descending without cues.   Patient in recliner in the room at end of session with breaks locked and all needs  within reach. Made patient independent in the room to simulate home mobility prior to d/c. Educated patient on donning non-skid  socks or shoes with transfers and ambulation in the room and to call for assistance as needed. Patient may ambulated in the room, to/from the bathroom, as needed, plan for [facility] by bed tonight to simulate night time toileting. Nursing staff made aware and certificate of independence placed outside of patient's door.   PT Discharge Precautions/Restrictions Precautions Precautions: Fall Required Braces or Orthoses: Splint/Cast Splint/Cast: LUE Restrictions Weight Bearing Restrictions: Yes LUE Weight Bearing: Weight bear through elbow only LLE Weight Bearing: Touchdown weight bearing Pain Interference Pain Interference Pain Effect on Sleep: 3. Frequently Pain Interference with Therapy Activities: 3. Frequently Pain Interference with Day-to-Day Activities: 1. Rarely or not at all Vision/Perception  Vision - History Ability to See in Adequate Light: 0 Adequate Perception Perception: Within Functional Limits Praxis Praxis: Intact  Cognition Overall Cognitive Status: Within Functional Limits for tasks assessed Arousal/Alertness: Awake/alert Orientation Level: Oriented X4 Memory: Impaired (baseline, appropriately uses compensatory strategies for recall and STM) Awareness: Appears intact Problem Solving: Appears intact Safety/Judgment: Appears intact Sensation Sensation Light Touch: Appears Intact Hot/Cold: Not tested Proprioception: Appears Intact Stereognosis: Not tested Coordination Gross Motor Movements are Fluid and Coordinated: No Fine Motor Movements are Fluid and Coordinated: Yes Coordination and Movement Description: generalized incoordination 2/2 WB restrictions, pain and debility Motor  Motor Motor: Other (comment);Abnormal postural alignment and control Motor - Discharge Observations: limited by WB restrictions and pain; generalized debility  Mobility Bed Mobility Rolling Right: Independent Rolling Left: Independent Supine to Sit: Independent Sitting - Scoot to  Edge of Bed: Independent Transfers Transfers: Sit to Stand;Stand to [employer] Transfers Sit to Stand: Independent with assistive device Stand to Sit: Independent with assistive device Stand Pivot Transfers: Independent with assistive device Transfer (Assistive device): Left platform walker Locomotion  Gait Ambulation: Yes Gait Assistance: Independent with assistive device Gait Distance (Feet): 50 Feet Assistive device: Left platform walker Gait Gait: Yes Gait Pattern: Impaired Gait Pattern:  (hop-to gait pattern on R with L TDWB or NWB) Gait velocity: reduced Stairs / Additional Locomotion Stairs: No Ramp: Contact Guard/touching assist (in w/c with R hemi-technique) Product manager Mobility: Yes Wheelchair Assistance: Independent with assistive device Wheelchair Propulsion: Right upper extremity;Right lower extremity Wheelchair Parts Management: Needs assistance (set-up assist or cuing) Distance: 150 ft  Trunk/Postural Assessment  Cervical Assessment Cervical Assessment: Within Functional Limits Thoracic Assessment Thoracic Assessment: Exceptions to [facility] (kyphotic) Lumbar Assessment Lumbar Assessment: Exceptions to [facility] (post tilt) Postural Control Postural Control: Deficits on evaluation (decreased due to WB restrictions)  Balance Static Sitting Balance Static Sitting - Level of Assistance: 7: Independent Dynamic Sitting Balance Dynamic Sitting - Level of Assistance: 7: Independent Static Standing Balance Static Standing - Level of Assistance: 6: Modified independent (Device/Increase time) Dynamic Standing Balance Dynamic Standing - Level of Assistance: 6: Modified independent (Device/Increase time) Extremity Assessment  RLE Assessment RLE Assessment: Within Functional Limits (deceased muscular endurance) General Strength Comments: Grossly 5/5 throughout in sitting LLE Assessment LLE Assessment: Exceptions to [facility] Active Range of Motion (AROM)  Comments: WFL, limited with pain at end range hip motion, tight hamstrings with pain >15 deg SLR in supine General Strength Comments: Grossly 4+ to 5/5 distal>proximal in sitting, limited by pain with hip strength testing   Jeily Guthridge L Donovon Micheletti PT, DPT, NCS, CBIS  10/25/2021, 11:00 AM

## 2021-10-26 LAB — CBC
HCT: 34.9 % — ABNORMAL LOW (ref 36.0–46.0)
Hemoglobin: 11.4 g/dL — ABNORMAL LOW (ref 12.0–15.0)
MCH: 31.9 pg (ref 26.0–34.0)
MCHC: 32.7 g/dL (ref 30.0–36.0)
MCV: 97.8 fL (ref 80.0–100.0)
Platelets: 345 10*3/uL (ref 150–400)
RBC: 3.57 MIL/uL — ABNORMAL LOW (ref 3.87–5.11)
RDW: 14.6 % (ref 11.5–15.5)
WBC: 5.5 10*3/uL (ref 4.0–10.5)
nRBC: 0 % (ref 0.0–0.2)

## 2021-10-26 LAB — BASIC METABOLIC PANEL
Anion gap: 4 — ABNORMAL LOW (ref 5–15)
BUN: 17 mg/dL (ref 8–23)
CO2: 26 mmol/L (ref 22–32)
Calcium: 8.1 mg/dL — ABNORMAL LOW (ref 8.9–10.3)
Chloride: 109 mmol/L (ref 98–111)
Creatinine, Ser: 0.61 mg/dL (ref 0.44–1.00)
GFR, Estimated: >60 mL/min (ref >60)
Glucose, Bld: 101 mg/dL — ABNORMAL HIGH (ref 70–99)
Potassium: 3.8 mmol/L (ref 3.5–5.1)
Sodium: 139 mmol/L (ref 135–145)

## 2021-10-26 MED ORDER — TRAMADOL HCL 50 MG PO TABS: 50.0000 mg | ORAL_TABLET | ORAL | 0 refills | Status: AC | PRN

## 2021-10-26 MED ORDER — SENNOSIDES-DOCUSATE SODIUM 8.6-50 MG PO TABS: 2.0000 | ORAL_TABLET | Freq: Every evening | ORAL | Status: AC | PRN

## 2021-10-26 MED ORDER — HYDROCODONE-ACETAMINOPHEN 5-325 MG PO TABS: 1.0000 | ORAL_TABLET | Freq: Four times a day (QID) | ORAL | 0 refills | Status: AC | PRN

## 2021-10-26 MED ORDER — TRAZODONE HCL 50 MG PO TABS
25.0000 mg | ORAL_TABLET | Freq: Every evening | ORAL | 1 refills | Status: AC | PRN
Start: 2021-10-26 — End: ?

## 2021-10-26 MED ORDER — OYSTER SHELL CALCIUM/D3 500-5 MG-MCG PO TABS: 1.0000 | ORAL_TABLET | Freq: Two times a day (BID) | ORAL | Status: AC

## 2021-10-26 MED ORDER — METHOCARBAMOL 500 MG PO TABS: 1000.0000 mg | ORAL_TABLET | Freq: Three times a day (TID) | ORAL | 0 refills | Status: AC | PRN

## 2021-10-26 MED ORDER — LIDOCAINE 5 % EX PTCH: 3.0000 | MEDICATED_PATCH | CUTANEOUS | 0 refills | Status: AC

## 2021-10-26 MED ORDER — AMLODIPINE BESYLATE 10 MG PO TABS: 10.0000 mg | ORAL_TABLET | Freq: Every day | ORAL | 0 refills | Status: AC

## 2021-10-26 NOTE — Progress Notes (Signed)
Patient ID: Katherine Atkins, female   DOB: Sep 11, 1940, 81 y.o.   MRN: [id number]  SW met with pt in room to discuss DME. Per [employer], no co-pay has been made. SW and pt called [employer] and made co-pay. DME will be delivered to room today.   Loralee Pacas, MSW, [place] Office: [phone] Cell: [phone] Fax: [phone]

## 2021-10-26 NOTE — Progress Notes (Signed)
PROGRESS NOTE   Subjective/Complaints:  No issues overnite   ROS: Patient denies CP, SOB, N/V/D  Objective:   No results found.  Recent Labs    10/26/21 0552  WBC 5.5  HGB 11.4*  HCT 34.9*  PLT 345    Recent Labs    10/26/21 0552  NA 139  K 3.8  CL 109  CO2 26  GLUCOSE 101*  BUN 17  CREATININE 0.61  CALCIUM 8.1*     Intake/Output Summary (Last 24 hours) at 10/26/2021 6270 Last data filed at 10/26/2021 0800 Gross per 24 hour  Intake 236 ml  Output --  Net 236 ml         Physical Exam: Vital Signs Blood pressure 124/75, pulse 72, temperature 98.2 F (36.8 C), temperature source Oral, resp. rate 18, height 5\' 2"  (1.575 m), weight 70.6 kg, SpO2 98 %.    General: No acute distress Mood and affect are appropriate Heart: Regular rate and rhythm no rubs murmurs or extra sounds Lungs: Clear to auscultation, breathing unlabored, no rales or wheezes Abdomen: Positive bowel sounds, soft nontender to palpation, nondistended Extremities: No clubbing, cyanosis, or edema  Skin: No evidence of breakdown, no evidence of rash. Wounds cdi Neuro:  Alert and oriented x 3. Normal insight and awareness. Intact Memory. Normal language and speech. 4/5 LUE except wrist and finger strength NT due to splint LLE 4/5 HF, KE ADF, RIgh tside 5/5  No limb ataxia or cerebellar signs. Tone is nl  Musculoskeletal: Left arm in splint. Bruising and swelling resolving.     Assessment/Plan: 1. Functional deficits which require 3+ hours per day of interdisciplinary therapy in a comprehensive inpatient rehab setting. Physiatrist is providing close team supervision and 24 hour management of active medical problems listed below. Physiatrist and rehab team continue to assess barriers to discharge/monitor patient progress toward functional and medical goals  Care Tool:  Bathing    Body parts bathed by patient: Right arm, Left arm,  Chest, Abdomen, Face, Right upper leg, Left upper leg, Front perineal area, Buttocks, Right lower leg, Left lower leg         Bathing assist Assist Level: Independent with assistive device     Upper Body Dressing/Undressing Upper body dressing   What is the patient wearing?: Button up shirt    Upper body assist Assist Level: Independent with assistive device    Lower Body Dressing/Undressing Lower body dressing      What is the patient wearing?: Pants     Lower body assist Assist for lower body dressing: Independent with assitive device     Toileting Toileting Toileting Activity did not occur (Clothing management and hygiene only): Refused  Toileting assist Assist for toileting: Independent with assistive device Assistive Device Comment: platform walker   Transfers Chair/bed transfer  Transfers assist     Chair/bed transfer assist level: Independent with assistive device Chair/bed transfer assistive device:   Ambulation assist      Assist level: Independent with assistive device Assistive device: Walker-platform Max distance: 50 ft   Walk 10 feet activity   Assist  Walk 10 feet activity did not occur: Safety/medical concerns (  unable due to endurance/pain)  Assist level: Independent with assistive device Assistive device: Walker-platform   Walk 50 feet activity   Assist Walk 50 feet with 2 turns activity did not occur: Safety/medical concerns  Assist level: Independent with assistive device Assistive device: Walker-platform    Walk 150 feet activity   Assist Walk 150 feet activity did not occur: Safety/medical concerns         Walk 10 feet on uneven surface  activity   Assist Walk 10 feet on uneven surfaces activity did not occur: Safety/medical concerns         Wheelchair     Assist Is the patient using a wheelchair?: Yes Type of Wheelchair: Manual    Wheelchair assist level:  Independent Max wheelchair distance: 150 ft    Wheelchair 50 feet with 2 turns activity    Assist        Assist Level: Independent   Wheelchair 150 feet activity     Assist      Assist Level: Independent   Blood pressure 124/75, pulse 72, temperature 98.2 F (36.8 C), temperature source Oral, resp. rate 18, height 5\' 2"  (1.575 m), weight 70.6 kg, SpO2 98 %.  Medical Problem List and Plan: 1. Functional deficits secondary to critical polytrauma; left wrist fracture/dislocation s/p ORIF, left femoral condyle fracture- NWB LUE distally and TDWB on LLE             -patient may  shower if they wrap LUE             -ELOS/Goals: 8/21 mod I to supervision, min a stairs for home entry  -Continue [name] therapies including PT, OT    -rx for ramp written. Will try to fax today   -f/u with ortho and [facility] 2.  Antithrombotics: -DVT/anticoagulation:  Pharmaceutical: Lovenox             -antiplatelet therapy: n/a 3. Pain Management:   robaxin,  and Ultram prn                -dc'ed regular Tylenol due to elevated LFTs             - con't tramadol and  Norco 5/325 mg for therapy to help control pain-  -lidocaine patches  -8/20 pain control adequate. Can send her home with tramadol and norco 4. Mood/Behavior/Sleep: team providing emotional support             -antipsychotic agents: n/a 5. Neuropsych/cognition: This patient is capable of making decisions on her own behalf. 6. Skin/Wound Care: local care 7. Fluids/Electrolytes/Nutrition: discussed nutrition. Boost shakes are fine. added protein supplement as well.  -added calcium and D for bones 8: Left wrist fracture/dislocation s/p ORIF 8/1; in splint             -platform weight-bear, no WB through wrist  -re-wrapped splint 8/7  - Dr. 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 High blood pressure- norvasc added 8/7   Vitals:   10/26/21 0322 10/26/21 0830  BP: 125/76 124/75  Pulse: 72   Resp: 18   Temp: 98.2 F (36.8 C)  SpO2: 98%      -norvasc at 10mg  daily currently.    -bp generally well controlled 8/20 10. Constipation:  -added senna-s bid, stopped colace  -resumed shceduled miralax   -regimen working for her  LOS: 15 days A FACE TO FACE EVALUATION WAS PERFORMED  9/20 10/26/2021, 9:18 AM

## 2021-10-26 NOTE — Plan of Care (Signed)
Pt to discharge today. 

## 2021-10-26 NOTE — Progress Notes (Signed)
Inpatient Rehabilitation Care Coordinator Discharge Note   Patient Details  Name: Katherine Atkins MRN: [id number] Date of Birth: January 17, 1941   Discharge location: D/c to home  Length of Stay: 14 days  Discharge activity level: ambulatory level for short household distances and wheelchair level in the community Modified Independent using L PFRW  Home/community participation: Limited  Patient response GS:PJSUNH Literacy - How often do you need to have someone help you when you read instructions, pamphlets, or other written material from your doctor or pharmacy?: Never  Patient response RV:ACQPEA Isolation - How often do you feel lonely or isolated from those around you?: Often ([place] people. Would opt to be around others.)  Services provided included: MD, RD, PT, OT, RN, TR, Pharmacy, Neuropsych, SW, CM  Financial Services:  Field seismologist Utilized: Private Insurance [facility] Medicare  Choices offered to/list presented to: Yes  Follow-up services arranged:  DME, Outpatient    Outpatient Servicies: [facility] for outpatient PT/OT DME : [employer] for RW with left platform attachment and wheelchair    Patient response to transportation need: Is the patient able to respond to transportation needs?: Yes In the past 12 months, has lack of transportation kept you from medical appointments or from getting medications?: No In the past 12 months, has lack of transportation kept you from meetings, work, or from getting things needed for daily living?: No   Comments (or additional information):  Patient/Family verbalized understanding of follow-up arrangements:  Yes  Individual responsible for coordination of the follow-up plan: contact pt  Confirmed correct DME delivered: Gretchen Short 10/26/2021    Gretchen Short

## 2021-10-26 NOTE — Progress Notes (Signed)
Patient discharged to home. Accompanied by her partner.

## 2021-10-26 NOTE — Progress Notes (Addendum)
Inpatient Rehabilitation Discharge Medication Review by a Pharmacist  A complete drug regimen review was completed for this patient to identify any potential clinically significant medication issues.  High Risk Drug Classes Is patient taking? Indication by Medication  Antipsychotic No   Anticoagulant No   Antibiotic No   Opioid Yes Tramadol, Norco- pain    Antiplatelet No   Hypoglycemics/insulin No   Vasoactive Medication Yes Amlodipine - HTN   Chemotherapy No   Other Yes Trazodone - sleep  Methocarbamol, licocaine - muscle spasm/pain      Type of Medication Issue Identified Description of Issue Recommendation(s)  Drug Interaction(s) (clinically significant)     Duplicate Therapy     Allergy     No Medication Administration End Date     Incorrect Dose     Additional Drug Therapy Needed     Significant med changes from prior encounter (inform family/care partners about these prior to discharge).    Other       Clinically significant medication issues were identified that warrant physician communication and completion of prescribed/recommended actions by midnight of the next day:  No  Time spent performing this drug regimen review (minutes):  30   Alexander An 10/26/2021 9:49 AM  ------------------------------------------------------------------------------------------------- I discussed / reviewed the pharmacy note by  Lyn Hollingshead An (PharmD student) and I agree with the resident's findings and plans as documented.  [name] BS, PharmD, BCPS Clinical Pharmacist 10/26/2021 9:58 AM  Contact: [phone] after 3 PM  "Be curious, not judgmental..." -Debbora Dus -------------------------------------------------------------------------------------------------

## 2021-11-02 ENCOUNTER — Encounter: Payer: Self-pay | Admitting: *Deleted

## 2021-11-02 ENCOUNTER — Telehealth: Payer: Self-pay

## 2021-11-02 NOTE — Telephone Encounter (Signed)
Need left orientation tub bench. Sending email to Cecile Sheerer, SW about this.

## 2021-11-02 NOTE — Telephone Encounter (Signed)
Opened in error

## 2021-11-02 NOTE — Telephone Encounter (Signed)
Message sent to PT to see to make it is ordered correctly.

## 2021-11-04 ENCOUNTER — Ambulatory Visit (INDEPENDENT_AMBULATORY_CARE_PROVIDER_SITE_OTHER): Payer: Medicare Other | Admitting: Ophthalmology

## 2021-11-04 ENCOUNTER — Encounter: Payer: Medicare Other | Attending: Registered Nurse | Admitting: Registered Nurse

## 2021-11-04 ENCOUNTER — Telehealth: Payer: Self-pay

## 2021-11-04 ENCOUNTER — Encounter: Payer: Self-pay | Admitting: Registered Nurse

## 2021-11-04 ENCOUNTER — Encounter (INDEPENDENT_AMBULATORY_CARE_PROVIDER_SITE_OTHER): Payer: Self-pay | Admitting: Ophthalmology

## 2021-11-04 VITALS — BP 129/79 | HR 62 | Ht 62.0 in | Wt 142.0 lb

## 2021-11-04 DIAGNOSIS — H353132 Nonexudative age-related macular degeneration, bilateral, intermediate dry stage: Secondary | ICD-10-CM | POA: Diagnosis not present

## 2021-11-04 DIAGNOSIS — I10 Essential (primary) hypertension: Secondary | ICD-10-CM | POA: Insufficient documentation

## 2021-11-04 DIAGNOSIS — H353211 Exudative age-related macular degeneration, right eye, with active choroidal neovascularization: Secondary | ICD-10-CM

## 2021-11-04 DIAGNOSIS — T07XXXA Unspecified multiple injuries, initial encounter: Secondary | ICD-10-CM | POA: Insufficient documentation

## 2021-11-04 MED ORDER — BEVACIZUMAB CHEMO INJECTION 1.25MG/0.05ML SYRINGE FOR KALEIDOSCOPE
1.2500 mg | INTRAVITREAL | Status: AC | PRN
  Administered 2021-11-04: 1.25 mg via INTRAVITREAL
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
## 2021-11-04 NOTE — Telephone Encounter (Signed)
Emmalina Espericueta niece called in to follow up on order for left orientation tub bench, advised we have been in touch with PT and will have an update during her visit for today 11/04/2021 with Jacalyn Lefevre, NP

## 2021-11-04 NOTE — Assessment & Plan Note (Signed)
No sign of CNVM OS 
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
## 2021-11-05 ENCOUNTER — Telehealth: Payer: Self-pay

## 2021-11-05 NOTE — Telephone Encounter (Signed)
Return Katherine Atkins call,  She will keep her appointment with physical therapy next week. We will see what the physical therpaist recommendation will be. She verbalizes understanding.

## 2021-11-05 NOTE — Telephone Encounter (Signed)
Katherine Atkins called to see if it was possible to receive in the home physical therapy? It is very difficult for her to leave the home. And it would make her life a whole lot more easier.   Call back phone [phone] Thank you
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
## 2021-11-11 ENCOUNTER — Telehealth: Payer: Self-pay | Admitting: Physical Therapy

## 2021-11-11 ENCOUNTER — Ambulatory Visit: Payer: Medicare Other | Attending: Physician Assistant | Admitting: Physical Therapy

## 2021-11-11 ENCOUNTER — Encounter: Payer: Self-pay | Admitting: Physical Therapy

## 2021-11-11 DIAGNOSIS — R2689 Other abnormalities of gait and mobility: Secondary | ICD-10-CM

## 2021-11-11 DIAGNOSIS — T07XXXA Unspecified multiple injuries, initial encounter: Secondary | ICD-10-CM | POA: Insufficient documentation

## 2021-11-11 NOTE — Telephone Encounter (Signed)
Placed a call to Ms. Ngu, line busy.  PT note was reviewed. Referral placed for Home Health for Physical and Occupational Therapy.

## 2021-11-11 NOTE — Telephone Encounter (Signed)
Katherine Atkins,  Katherine Atkins showed up for her outpatient PT evaluation, evaluation not completed due to pt and family request for HHPT on 11/11/2021.  The patient would benefit from a home health evaluation for PT due to difficulty leaving the house due to current physical limitations.    Thank you, Peter Congo, PT, DPT, [facility] [address] [place], [place]  [address] Phone:  [phone] Fax:  [phone]

## 2021-11-11 NOTE — Therapy (Signed)
OUTPATIENT PHYSICAL THERAPY NEURO EVALUATION-ARRIVED NO CHARGE   Patient Name: Katherine Atkins MRN: [id number] DOB:10/07/1940, 81 y.o., female Today's Date: 11/11/2021   PCP: Geoffry Paradise, MD REFERRING PROVIDER: Ranelle Oyster, MD ; Milinda Antis, PA-C   PT End of Session - 11/11/21 1147     Visit Number 1    Authorization Type [employer]    PT Start Time 1144    PT Stop Time 1200   not picked up for therapy services, eval not completed as pt needs home health services at this time   PT Time Calculation (min) 16 min             Past Medical History:  Diagnosis Date   Blood dyscrasia    Cataract    Fever 08/27/2013   Heart murmur    Osteoporosis    Past Surgical History:  Procedure Laterality Date   CATARACT EXTRACTION  2013   EYE SURGERY     ORIF WRIST FRACTURE Left 10/06/2021   Procedure: 1.  Left distal radius open reduction internal fixation, intra-articular, >3 fragments 2.   Left wrist brachioradialis tenotomy  3.   Left wrist radiographs four views with intraoperative interpretation   ;  Surgeon: Gomez Cleverly, MD;  Location: [facility] OR;  Service: Orthopedics;  Laterality: Left;   Patient Active Problem List   Diagnosis Date Noted   Critical polytrauma 10/11/2021   Pelvic fracture (HCC) 10/05/2021   Exudative age-related macular degeneration of right eye with active choroidal neovascularization (HCC) 08/18/2021   Intermediate stage nonexudative age-related macular degeneration of both eyes 02/25/2020   Posterior vitreous detachment of both eyes 02/25/2020   Rash and nonspecific skin eruption 08/28/2013   Pancytopenia (HCC) 08/27/2013   Elevated liver function tests 08/27/2013   Ehrlichiosis 08/27/2013   Leukopenia 08/25/2013   HEMORRHOIDS, INTERNAL 01/05/2010   FECAL INCONTINENCE 01/05/2010    ONSET DATE: 10/26/2021   REFERRING DIAG: T07.Lorne Skeens (ICD-10-CM) - Critical polytrauma   THERAPY DIAG:  Critical polytrauma  Rationale for Evaluation and  Treatment Rehabilitation  SUBJECTIVE:                                                                                                                                                                                              SUBJECTIVE STATEMENT: Pt arrived for her outpatient PT evaluation. Pt and her caregiver Dennie Bible report significant difficulty leaving their home and getting out into the community due to patient's currently physical limitations. Pt and her caregiver report they would prefer to start with home health therapy services at this time due to difficulty leaving their home. Telephone Encounter sent  to Jacalyn Lefevre, NP regarding this preference in therapy venue as they have already discussed this preference with her.  Pt accompanied by: self and caregiver Katherine Atkins  PLAN: PT FREQUENCY: one time visit   Peter Congo, PT, DPT, CSRS 11/11/2021, 12:15 PM

## 2021-11-12 ENCOUNTER — Telehealth: Payer: Self-pay | Admitting: Registered Nurse

## 2021-11-12 NOTE — Telephone Encounter (Signed)
I spoke with patient and told her with a medicare policy she would have to be homebound in order for them to accept her.  She wants to defer therapy at this time at outpatient clinic.

## 2021-12-23 ENCOUNTER — Encounter: Payer: Medicare Other | Attending: Registered Nurse | Admitting: Physical Medicine & Rehabilitation

## 2021-12-23 DIAGNOSIS — T07XXXA Unspecified multiple injuries, initial encounter: Secondary | ICD-10-CM | POA: Insufficient documentation

## 2021-12-23 DIAGNOSIS — I10 Essential (primary) hypertension: Secondary | ICD-10-CM | POA: Insufficient documentation

## 2021-12-30 ENCOUNTER — Encounter (INDEPENDENT_AMBULATORY_CARE_PROVIDER_SITE_OTHER): Payer: Medicare Other | Admitting: Ophthalmology

## 2022-02-10 ENCOUNTER — Ambulatory Visit: Payer: Medicare Other | Admitting: Physical Medicine & Rehabilitation

## 2022-02-25 ENCOUNTER — Encounter (INDEPENDENT_AMBULATORY_CARE_PROVIDER_SITE_OTHER): Payer: Medicare Other | Admitting: Ophthalmology
